# Patient Record
Sex: Female | Born: 1945 | Race: White | Hispanic: No | Smoking: Never smoker
Health system: Southern US, Community
[De-identification: ages and names within clinical notes are randomized; demographics above are authoritative.]

## PROBLEM LIST (undated history)

## (undated) DIAGNOSIS — C4491 Basal cell carcinoma of skin, unspecified: Secondary | ICD-10-CM

## (undated) DIAGNOSIS — M1990 Unspecified osteoarthritis, unspecified site: Secondary | ICD-10-CM

## (undated) DIAGNOSIS — R519 Headache, unspecified: Secondary | ICD-10-CM

## (undated) DIAGNOSIS — C801 Malignant (primary) neoplasm, unspecified: Secondary | ICD-10-CM

## (undated) DIAGNOSIS — Z8701 Personal history of pneumonia (recurrent): Secondary | ICD-10-CM

## (undated) DIAGNOSIS — R51 Headache: Secondary | ICD-10-CM

## (undated) DIAGNOSIS — U071 COVID-19: Secondary | ICD-10-CM

## (undated) DIAGNOSIS — Z8709 Personal history of other diseases of the respiratory system: Secondary | ICD-10-CM

## (undated) DIAGNOSIS — E785 Hyperlipidemia, unspecified: Secondary | ICD-10-CM

## (undated) DIAGNOSIS — N63 Unspecified lump in unspecified breast: Secondary | ICD-10-CM

## (undated) HISTORY — PX: JOINT REPLACEMENT: SHX530

## (undated) HISTORY — PX: BREAST LUMPECTOMY: SHX2

## (undated) HISTORY — DX: Basal cell carcinoma of skin, unspecified: C44.91

## (undated) HISTORY — DX: Hyperlipidemia, unspecified: E78.5

## (undated) HISTORY — PX: COLONOSCOPY: SHX174

## (undated) HISTORY — PX: DILATION AND CURETTAGE OF UTERUS: SHX78

## (undated) HISTORY — PX: MASTECTOMY PARTIAL / LUMPECTOMY: SUR851

## (undated) HISTORY — PX: WISDOM TOOTH EXTRACTION: SHX21

## (undated) HISTORY — DX: COVID-19: U07.1

## (undated) HISTORY — PX: MOHS SURGERY: SUR867

---

## 2014-04-29 HISTORY — PX: BREAST LUMPECTOMY: SHX2

## 2014-07-05 ENCOUNTER — Other Ambulatory Visit: Payer: Self-pay | Admitting: Orthopedic Surgery

## 2014-07-05 DIAGNOSIS — M25562 Pain in left knee: Secondary | ICD-10-CM

## 2014-07-06 ENCOUNTER — Ambulatory Visit
Admission: RE | Admit: 2014-07-06 | Discharge: 2014-07-06 | Disposition: A | Discharge status: Home / Self Care | Payer: Medicare Other | Source: Ambulatory Visit | Attending: Orthopedic Surgery | Admitting: Orthopedic Surgery

## 2014-07-06 DIAGNOSIS — M25562 Pain in left knee: Secondary | ICD-10-CM

## 2015-02-24 ENCOUNTER — Other Ambulatory Visit: Payer: Self-pay | Admitting: General Surgery

## 2015-02-24 DIAGNOSIS — R928 Other abnormal and inconclusive findings on diagnostic imaging of breast: Secondary | ICD-10-CM

## 2015-03-16 ENCOUNTER — Other Ambulatory Visit: Payer: Self-pay | Admitting: General Surgery

## 2015-03-16 DIAGNOSIS — R928 Other abnormal and inconclusive findings on diagnostic imaging of breast: Secondary | ICD-10-CM

## 2015-03-17 ENCOUNTER — Encounter (HOSPITAL_BASED_OUTPATIENT_CLINIC_OR_DEPARTMENT_OTHER): Payer: Self-pay | Admitting: *Deleted

## 2015-03-27 ENCOUNTER — Ambulatory Visit
Admission: RE | Admit: 2015-03-27 | Discharge: 2015-03-27 | Disposition: A | Discharge status: Home / Self Care | Payer: Medicare Other | Source: Ambulatory Visit | Attending: General Surgery | Admitting: General Surgery

## 2015-03-27 ENCOUNTER — Other Ambulatory Visit: Payer: Self-pay | Admitting: General Surgery

## 2015-03-27 DIAGNOSIS — R928 Other abnormal and inconclusive findings on diagnostic imaging of breast: Secondary | ICD-10-CM

## 2015-03-28 ENCOUNTER — Ambulatory Visit (HOSPITAL_BASED_OUTPATIENT_CLINIC_OR_DEPARTMENT_OTHER): Admission: RE | Admit: 2015-03-28 | Payer: Medicare Other | Source: Ambulatory Visit | Admitting: General Surgery

## 2015-03-28 HISTORY — DX: Headache, unspecified: R51.9

## 2015-03-28 HISTORY — DX: Headache: R51

## 2015-03-28 HISTORY — DX: Unspecified lump in unspecified breast: N63.0

## 2015-03-28 SURGERY — RADIOACTIVE SEED GUIDED BREAST BIOPSY
Anesthesia: General | Site: Breast | Laterality: Right

## 2015-03-31 ENCOUNTER — Other Ambulatory Visit: Payer: Self-pay | Admitting: General Surgery

## 2015-03-31 DIAGNOSIS — R928 Other abnormal and inconclusive findings on diagnostic imaging of breast: Secondary | ICD-10-CM

## 2015-04-05 ENCOUNTER — Encounter (HOSPITAL_COMMUNITY)
Admission: RE | Admit: 2015-04-05 | Discharge: 2015-04-05 | Disposition: A | Discharge status: Home / Self Care | Payer: Medicare Other | Source: Ambulatory Visit | Attending: General Surgery | Admitting: General Surgery

## 2015-04-05 ENCOUNTER — Ambulatory Visit
Admission: RE | Admit: 2015-04-05 | Discharge: 2015-04-05 | Disposition: A | Discharge status: Home / Self Care | Payer: Medicare Other | Source: Ambulatory Visit | Attending: General Surgery | Admitting: General Surgery

## 2015-04-05 ENCOUNTER — Encounter (HOSPITAL_COMMUNITY): Payer: Self-pay

## 2015-04-05 ENCOUNTER — Encounter (HOSPITAL_COMMUNITY): Payer: Self-pay | Admitting: General Surgery

## 2015-04-05 ENCOUNTER — Other Ambulatory Visit: Payer: Self-pay | Admitting: General Surgery

## 2015-04-05 DIAGNOSIS — Z85820 Personal history of malignant melanoma of skin: Secondary | ICD-10-CM | POA: Diagnosis not present

## 2015-04-05 DIAGNOSIS — R928 Other abnormal and inconclusive findings on diagnostic imaging of breast: Secondary | ICD-10-CM

## 2015-04-05 DIAGNOSIS — R920 Mammographic microcalcification found on diagnostic imaging of breast: Secondary | ICD-10-CM | POA: Diagnosis present

## 2015-04-05 DIAGNOSIS — Z79899 Other long term (current) drug therapy: Secondary | ICD-10-CM | POA: Diagnosis not present

## 2015-04-05 DIAGNOSIS — M1990 Unspecified osteoarthritis, unspecified site: Secondary | ICD-10-CM | POA: Diagnosis not present

## 2015-04-05 DIAGNOSIS — E7800 Pure hypercholesterolemia, unspecified: Secondary | ICD-10-CM | POA: Diagnosis not present

## 2015-04-05 DIAGNOSIS — Z791 Long term (current) use of non-steroidal anti-inflammatories (NSAID): Secondary | ICD-10-CM | POA: Diagnosis not present

## 2015-04-05 HISTORY — DX: Malignant (primary) neoplasm, unspecified: C80.1

## 2015-04-05 HISTORY — DX: Unspecified osteoarthritis, unspecified site: M19.90

## 2015-04-05 LAB — CBC
HEMATOCRIT: 44.7 % (ref 36.0–46.0)
Hemoglobin: 14.7 g/dL (ref 12.0–15.0)
MCH: 30.5 pg (ref 26.0–34.0)
MCHC: 32.9 g/dL (ref 30.0–36.0)
MCV: 92.7 fL (ref 78.0–100.0)
PLATELETS: 275 10*3/uL (ref 150–400)
RBC: 4.82 MIL/uL (ref 3.87–5.11)
RDW: 12.1 % (ref 11.5–15.5)
WBC: 8.2 10*3/uL (ref 4.0–10.5)

## 2015-04-05 LAB — BASIC METABOLIC PANEL
Anion gap: 8 (ref 5–15)
BUN: 11 mg/dL (ref 6–20)
CO2: 29 mmol/L (ref 22–32)
Calcium: 10.1 mg/dL (ref 8.9–10.3)
Chloride: 105 mmol/L (ref 101–111)
Creatinine, Ser: 0.7 mg/dL (ref 0.44–1.00)
GFR calc Af Amer: >60 mL/min (ref >60)
GFR calc non Af Amer: >60 mL/min (ref >60)
Glucose, Bld: 100 mg/dL — ABNORMAL HIGH (ref 65–99)
Potassium: 4.4 mmol/L (ref 3.5–5.1)
Sodium: 142 mmol/L (ref 135–145)

## 2015-04-05 NOTE — Progress Notes (Signed)
PCP is Dr Lisbeth Ply Denies seeing a cardiologist Denies ever having a card cath or echo States she had a stress test over 15 years ago.

## 2015-04-05 NOTE — Pre-Procedure Instructions (Signed)
Andrea Spencer  04/05/2015     No Pharmacies Listed   Your procedure is scheduled on Dec 8  Report to [place] at 530 A.M.  Call this number if you have problems the morning of surgery:  [phone]   Remember:  Do not eat food or drink liquids after midnight.  Take these medicines the morning of surgery with A SIP OF WATER NA  Stop taking aspirin, Ibuprofen, BC's, Goody's, Herbal medications and Fish Oil, Glucosamine, Biotin    Do not wear jewelry, make-up or nail polish.  Do not wear lotions, powders, or perfumes.  You may wear deodorant.  Do not shave 48 hours prior to surgery.  Men may shave face and neck.  Do not bring valuables to the hospital.  [facility] is not responsible for any belongings or valuables.  Contacts, dentures or bridgework may not be worn into surgery.  Leave your suitcase in the car.  After surgery it may be brought to your room.  For patients admitted to the hospital, discharge time will be determined by your treatment team.  Patients discharged the day of surgery will not be allowed to drive home.    Special instructions:   - Preparing for Surgery  Before surgery, you can play an important role.  Because skin is not sterile, your skin needs to be as free of germs as possible.  You can reduce the number of germs on you skin by washing with CHG (chlorahexidine gluconate) soap before surgery.  CHG is an antiseptic cleaner which kills germs and bonds with the skin to continue killing germs even after washing.  Please DO NOT use if you have an allergy to CHG or antibacterial soaps.  If your skin becomes reddened/irritated stop using the CHG and inform your nurse when you arrive at Short Stay.  Do not shave (including legs and underarms) for at least 48 hours prior to the first CHG shower.  You may shave your face.  Please follow these instructions carefully:   1.  Shower with CHG Soap the night before surgery  and the    morning of Surgery.  2.  If you choose to wash your hair, wash your hair first as usual with your normal shampoo.  3.  After you shampoo, rinse your hair and body thoroughly to remove the                      Shampoo.  4.  Use CHG as you would any other liquid soap.  You can apply chg directly       to the skin and wash gently with scrungie or a clean washcloth.  5.  Apply the CHG Soap to your body ONLY FROM THE NECK DOWN.        Do not use on open wounds or open sores.  Avoid contact with your eyes,       ears, mouth and genitals (private parts).  Wash genitals (private parts)       with your normal soap.  6.  Wash thoroughly, paying special attention to the area where your surgery        will be performed.  7.  Thoroughly rinse your body with warm water from the neck down.  8.  DO NOT shower/wash with your normal soap after using and rinsing off       the CHG Soap.  9.  Pat yourself dry with a clean  towel.            10.  Wear clean pajamas.            11.  Place clean sheets on your bed the night of your first shower and do not        sleep with pets.  Day of Surgery  Do not apply any lotions/deoderants the morning of surgery.  Please wear clean clothes to the hospital/surgery center.     Please read over the following fact sheets that you were given. Pain Booklet, Coughing and Deep Breathing and Surgical Site Infection Prevention

## 2015-04-05 NOTE — H&P (Signed)
[facility] Location: [facility] Patient #: [id number] DOB: [birth year] Married / Language: English / Race: White Female   History of Present Illness Patient words: atypical ductal hyperplasia.  The patient is a 69 year old female who presents with a complaint of Breast problems. Patient is a 69 year old female who is referred by Dr. Lisbeth Ply for a abnormal right mammogram. The patient underwent screening mammography and had calcifications that warranted diagnostic mammography. This demonstrated a group of calcifications in the lower outer quadrant of the right breast. Core needle biopsy was performed which actually demonstrated 2 small areas adjacent to each other. There was fat necrosis and fibrosis with atrophic breast tissue and then there is an area of focal atypical ductal proliferation. It is also another area of calcifications that had been recommended for six-month follow-up. This has not been biopsied.  The patient denies breast pain. She has not had a breast biopsy before. Her father had prostate cancer.    Other Problems Arthritis Hypercholesterolemia Melanoma  Past Surgical History Breast Biopsy Right.  Diagnostic Studies History  Colonoscopy 5-10 years ago Mammogram within last year  Allergies  No Known Drug Allergies10/28/2016  Medication History Atorvastatin Calcium (10MG  Tablet, Oral daily) Active. Meloxicam (15MG  Tablet, Oral daily) Active. (Pt takes 7.5mg  daily) Multivitamin (Oral) Active. Omega 3 (1000MG  Capsule, Oral) Active. Vitamin C (1000MG  Tablet, Oral) Active. Vitamin D (Cholecalciferol) (1000UNIT Tablet, Oral daily) Active. Medications Reconciled  Social History Alcohol use Occasional alcohol use. Caffeine use Coffee. No drug use Tobacco use Never smoker.  Family History Cerebrovascular Accident Mother. Prostate Cancer Father.  Pregnancy / Birth History Age at menarche 41 years. Age of  menopause 61-62 Contraceptive History Oral contraceptives. Gravida 1 Maternal age 72-30 Para 1    Review of Systems  General Not Present- Appetite Loss, Chills, Fatigue, Fever, Night Sweats, Weight Gain and Weight Loss. Skin Not Present- Change in Wart/Mole, Dryness, Hives, Jaundice, New Lesions, Non-Healing Wounds, Rash and Ulcer. HEENT Present- Wears glasses/contact lenses. Not Present- Earache, Hearing Loss, Hoarseness, Nose Bleed, Oral Ulcers, Ringing in the Ears, Seasonal Allergies, Sinus Pain, Sore Throat, Visual Disturbances and Yellow Eyes. Respiratory Not Present- Bloody sputum, Chronic Cough, Difficulty Breathing, Snoring and Wheezing. Breast Not Present- Breast Mass, Breast Pain, Nipple Discharge and Skin Changes. Cardiovascular Not Present- Chest Pain, Difficulty Breathing Lying Down, Leg Cramps, Palpitations, Rapid Heart Rate, Shortness of Breath and Swelling of Extremities. Gastrointestinal Not Present- Abdominal Pain, Bloating, Bloody Stool, Change in Bowel Habits, Chronic diarrhea, Constipation, Difficulty Swallowing, Excessive gas, Gets full quickly at meals, Hemorrhoids, Indigestion, Nausea, Rectal Pain and Vomiting. Female Genitourinary Not Present- Frequency, Nocturia, Painful Urination, Pelvic Pain and Urgency. Musculoskeletal Present- Joint Stiffness. Not Present- Back Pain, Joint Pain, Muscle Pain, Muscle Weakness and Swelling of Extremities. Neurological Not Present- Decreased Memory, Fainting, Headaches, Numbness, Seizures, Tingling, Tremor, Trouble walking and Weakness. Psychiatric Not Present- Anxiety, Bipolar, Change in Sleep Pattern, Depression, Fearful and Frequent crying. Endocrine Present- Hair Changes. Not Present- Cold Intolerance, Excessive Hunger, Heat Intolerance, Hot flashes and New Diabetes. Hematology Not Present- Easy Bruising, Excessive bleeding, Gland problems, HIV and Persistent Infections.  Vitals [name] CMA; 02/24/2015 10:39  AM) 02/24/2015 10:39 AM Weight: 147.4 lb Height: 62in Body Surface Area: 1.68 m Body Mass Index: 26.96 kg/m  Temp.: 98.61F(Oral)  Pulse: 76 (Regular)  BP: 162/80 (Sitting, Left Arm, Standard)       Physical Exam General Mental Status-Alert. General Appearance-Consistent with stated age. Hydration-Well hydrated. Voice-Normal.  Head and Neck Head-normocephalic, atraumatic with no  lesions or palpable masses. Trachea-midline. Thyroid Gland Characteristics - normal size and consistency.  Eye Eyeball - Bilateral-Extraocular movements intact. Sclera/Conjunctiva - Bilateral-No scleral icterus.  Chest and Lung Exam Chest and lung exam reveals -quiet, even and easy respiratory effort with no use of accessory muscles and on auscultation, normal breath sounds, no adventitious sounds and normal vocal resonance. Inspection Chest Wall - Normal. Back - normal.  Breast Note: The left breast is significantly larger than the right breast. There are no palpable masses in either breast. There is no nipple retraction or skin dimpling. She has no axillary lymphadenopathy.   Cardiovascular Cardiovascular examination reveals -normal heart sounds, regular rate and rhythm with no murmurs and normal pedal pulses bilaterally.  Abdomen Inspection Inspection of the abdomen reveals - No Hernias. Palpation/Percussion Palpation and Percussion of the abdomen reveal - Soft, Non Tender, No Rebound tenderness, No Rigidity (guarding) and No hepatosplenomegaly. Auscultation Auscultation of the abdomen reveals - Bowel sounds normal.  Neurologic Neurologic evaluation reveals -alert and oriented x 3 with no impairment of recent or remote memory. Mental Status-Normal.  Musculoskeletal Global Assessment -Note: no gross deformities.  Normal Exam - Left-Upper Extremity Strength Normal and Lower Extremity Strength Normal. Normal Exam - Right-Upper Extremity  Strength Normal and Lower Extremity Strength Normal.  Lymphatic Head & Neck  General Head & Neck Lymphatics: Bilateral - Description - Normal. Axillary  General Axillary Region: Bilateral - Description - Normal. Tenderness - Non Tender. Femoral & Inguinal  Generalized Femoral & Inguinal Lymphatics: Bilateral - Description - No Generalized lymphadenopathy.    Assessment & Plan  ABNORMAL MAMMOGRAM OF RIGHT BREAST (R92.8) Impression: The patient will need an excisional biopsy of the area with the atypical ductal hyperplasia. She may also need a biopsy of the additional area that is being followed. I'll discuss that with radiology.  I reviewed the risks of surgery as well as the procedure. I discussed that we typically have a [place] wire placed following the appropriate area for excision. I discussed the risks of bleeding, infection, and damage to adjacent structures, numbness, change in breast contour, heart or lung complications, and risk of cancer with possible need for requiring additional surgeries or procedures. Patient understands and wishes to proceed. Current Plans Pt Education - [facility] Breast Biopsy HCI: discussed with patient and provided information. You are being scheduled for surgery - Our schedulers will call you.  You should hear from our office's scheduling department within 5 working days about the location, date, and time of surgery. We try to make accommodations for patient's preferences in scheduling surgery, but sometimes the OR schedule or the surgeon's schedule prevents [place] from making those accommodations.  If you have not heard from our office [phone]) in 5 working days, call the office and ask for your surgeon's nurse.  If you have other questions about your diagnosis, plan, or surgery, call the office and ask for your surgeon's nurse.  Pt Education - [facility] Free Text Education/Instructions: discussed with patient and provided information.   Signed by Stark Klein, MD

## 2015-04-06 ENCOUNTER — Encounter (HOSPITAL_COMMUNITY): Payer: Self-pay | Admitting: Surgery

## 2015-04-06 ENCOUNTER — Encounter (HOSPITAL_COMMUNITY)
Admission: RE | Disposition: A | Discharge status: Home / Self Care | Payer: Self-pay | Source: Ambulatory Visit | Attending: General Surgery

## 2015-04-06 ENCOUNTER — Ambulatory Visit (HOSPITAL_COMMUNITY)
Admission: RE | Admit: 2015-04-06 | Discharge: 2015-04-06 | Disposition: A | Discharge status: Home / Self Care | Payer: Medicare Other | Source: Ambulatory Visit | Attending: General Surgery | Admitting: General Surgery

## 2015-04-06 ENCOUNTER — Ambulatory Visit (HOSPITAL_COMMUNITY): Payer: Medicare Other | Admitting: Certified Registered Nurse Anesthetist

## 2015-04-06 ENCOUNTER — Ambulatory Visit
Admission: RE | Admit: 2015-04-06 | Discharge: 2015-04-06 | Disposition: A | Discharge status: Home / Self Care | Payer: Medicare Other | Source: Ambulatory Visit | Attending: General Surgery | Admitting: General Surgery

## 2015-04-06 DIAGNOSIS — R920 Mammographic microcalcification found on diagnostic imaging of breast: Secondary | ICD-10-CM | POA: Diagnosis not present

## 2015-04-06 DIAGNOSIS — Z85820 Personal history of malignant melanoma of skin: Secondary | ICD-10-CM | POA: Insufficient documentation

## 2015-04-06 DIAGNOSIS — M1990 Unspecified osteoarthritis, unspecified site: Secondary | ICD-10-CM | POA: Insufficient documentation

## 2015-04-06 DIAGNOSIS — Z79899 Other long term (current) drug therapy: Secondary | ICD-10-CM | POA: Insufficient documentation

## 2015-04-06 DIAGNOSIS — R928 Other abnormal and inconclusive findings on diagnostic imaging of breast: Secondary | ICD-10-CM

## 2015-04-06 DIAGNOSIS — E7800 Pure hypercholesterolemia, unspecified: Secondary | ICD-10-CM | POA: Insufficient documentation

## 2015-04-06 DIAGNOSIS — Z791 Long term (current) use of non-steroidal anti-inflammatories (NSAID): Secondary | ICD-10-CM | POA: Insufficient documentation

## 2015-04-06 HISTORY — PX: RADIOACTIVE SEED GUIDED EXCISIONAL BREAST BIOPSY: SHX6490

## 2015-04-06 SURGERY — RADIOACTIVE SEED GUIDED BREAST BIOPSY
Anesthesia: General | Site: Breast | Laterality: Right

## 2015-04-06 MED ORDER — ONDANSETRON HCL 4 MG/2ML IJ SOLN
INTRAMUSCULAR | Status: AC
  Filled 2015-04-06: qty 2

## 2015-04-06 MED ORDER — LIDOCAINE HCL 1 % IJ SOLN
INTRAMUSCULAR | Status: DC | PRN
  Administered 2015-04-06: 1 mL via INTRAMUSCULAR

## 2015-04-06 MED ORDER — MIDAZOLAM HCL 5 MG/5ML IJ SOLN
INTRAMUSCULAR | Status: DC | PRN
  Administered 2015-04-06: 2 mg via INTRAVENOUS

## 2015-04-06 MED ORDER — MIDAZOLAM HCL 2 MG/2ML IJ SOLN
INTRAMUSCULAR | Status: AC
  Filled 2015-04-06: qty 2

## 2015-04-06 MED ORDER — LIDOCAINE HCL (CARDIAC) 10 MG/ML IV SOLN
INTRAVENOUS | Status: DC | PRN
  Administered 2015-04-06: 60 mg via INTRAVENOUS

## 2015-04-06 MED ORDER — LIDOCAINE HCL (PF) 1 % IJ SOLN
INTRAMUSCULAR | Status: AC
  Filled 2015-04-06: qty 30

## 2015-04-06 MED ORDER — HYDROMORPHONE HCL 1 MG/ML IJ SOLN: 0.2500 mg | INTRAMUSCULAR | Status: DC | PRN

## 2015-04-06 MED ORDER — PROPOFOL 10 MG/ML IV BOLUS
INTRAVENOUS | Status: AC
  Filled 2015-04-06: qty 20

## 2015-04-06 MED ORDER — ROCURONIUM BROMIDE 50 MG/5ML IV SOLN
INTRAVENOUS | Status: AC
  Filled 2015-04-06: qty 1

## 2015-04-06 MED ORDER — NEOSTIGMINE METHYLSULFATE 10 MG/10ML IV SOLN
INTRAVENOUS | Status: AC
  Filled 2015-04-06: qty 1

## 2015-04-06 MED ORDER — EPHEDRINE SULFATE 50 MG/ML IJ SOLN
INTRAMUSCULAR | Status: AC
  Filled 2015-04-06: qty 1

## 2015-04-06 MED ORDER — SODIUM CHLORIDE 0.9 % IJ SOLN
INTRAMUSCULAR | Status: AC
  Filled 2015-04-06: qty 10

## 2015-04-06 MED ORDER — EPINEPHRINE HCL 0.1 MG/ML IJ SOSY
PREFILLED_SYRINGE | INTRAMUSCULAR | Status: AC
  Filled 2015-04-06: qty 10

## 2015-04-06 MED ORDER — PHENYLEPHRINE 40 MCG/ML (10ML) SYRINGE FOR IV PUSH (FOR BLOOD PRESSURE SUPPORT)
PREFILLED_SYRINGE | INTRAVENOUS | Status: AC
  Filled 2015-04-06: qty 10

## 2015-04-06 MED ORDER — LACTATED RINGERS IV SOLN
INTRAVENOUS | Status: DC | PRN
  Administered 2015-04-06: 08:00:00 via INTRAVENOUS

## 2015-04-06 MED ORDER — GLYCOPYRROLATE 0.2 MG/ML IJ SOLN
INTRAMUSCULAR | Status: AC
  Filled 2015-04-06: qty 1

## 2015-04-06 MED ORDER — LIDOCAINE HCL (CARDIAC) 20 MG/ML IV SOLN
INTRAVENOUS | Status: AC
  Filled 2015-04-06: qty 5

## 2015-04-06 MED ORDER — CEFAZOLIN SODIUM-DEXTROSE 2-3 GM-% IV SOLR
2.0000 g | INTRAVENOUS | Status: AC
  Administered 2015-04-06: 2 g via INTRAVENOUS

## 2015-04-06 MED ORDER — PROPOFOL 10 MG/ML IV BOLUS
INTRAVENOUS | Status: DC | PRN
  Administered 2015-04-06: 170 mg via INTRAVENOUS

## 2015-04-06 MED ORDER — CEFAZOLIN SODIUM-DEXTROSE 2-3 GM-% IV SOLR
INTRAVENOUS | Status: AC
  Filled 2015-04-06: qty 50

## 2015-04-06 MED ORDER — DEXAMETHASONE SODIUM PHOSPHATE 4 MG/ML IJ SOLN
INTRAMUSCULAR | Status: DC | PRN
  Administered 2015-04-06: 10 mg via INTRAVENOUS

## 2015-04-06 MED ORDER — PROMETHAZINE HCL 25 MG/ML IJ SOLN: 6.2500 mg | INTRAMUSCULAR | Status: DC | PRN

## 2015-04-06 MED ORDER — 0.9 % SODIUM CHLORIDE (POUR BTL) OPTIME
TOPICAL | Status: DC | PRN
  Administered 2015-04-06: 1000 mL

## 2015-04-06 MED ORDER — ONDANSETRON HCL 4 MG/2ML IJ SOLN
INTRAMUSCULAR | Status: DC | PRN
Start: 2015-04-06 — End: 2015-04-06
  Administered 2015-04-06: 4 mg via INTRAVENOUS

## 2015-04-06 MED ORDER — EPHEDRINE SULFATE 50 MG/ML IJ SOLN
INTRAMUSCULAR | Status: DC | PRN
  Administered 2015-04-06: 10 mg via INTRAVENOUS

## 2015-04-06 MED ORDER — SUCCINYLCHOLINE CHLORIDE 20 MG/ML IJ SOLN
INTRAMUSCULAR | Status: AC
  Filled 2015-04-06: qty 1

## 2015-04-06 MED ORDER — FENTANYL CITRATE (PF) 100 MCG/2ML IJ SOLN
INTRAMUSCULAR | Status: DC | PRN
  Administered 2015-04-06: 50 ug via INTRAVENOUS
  Administered 2015-04-06: 25 ug via INTRAVENOUS
  Administered 2015-04-06 (×2): 12.5 ug via INTRAVENOUS

## 2015-04-06 MED ORDER — NEOSTIGMINE METHYLSULFATE 10 MG/10ML IV SOLN
INTRAVENOUS | Status: AC
  Filled 2015-04-06: qty 2

## 2015-04-06 MED ORDER — ATROPINE SULFATE 0.1 MG/ML IJ SOLN
INTRAMUSCULAR | Status: AC
  Filled 2015-04-06: qty 10

## 2015-04-06 MED ORDER — OXYCODONE-ACETAMINOPHEN 5-325 MG PO TABS: 1.0000 | ORAL_TABLET | ORAL | Status: DC | PRN

## 2015-04-06 MED ORDER — FENTANYL CITRATE (PF) 250 MCG/5ML IJ SOLN
INTRAMUSCULAR | Status: AC
  Filled 2015-04-06: qty 5

## 2015-04-06 MED ORDER — BUPIVACAINE-EPINEPHRINE (PF) 0.25% -1:200000 IJ SOLN
INTRAMUSCULAR | Status: AC
  Filled 2015-04-06: qty 30

## 2015-04-06 MED ORDER — MEPERIDINE HCL 25 MG/ML IJ SOLN: 6.2500 mg | INTRAMUSCULAR | Status: DC | PRN

## 2015-04-06 SURGICAL SUPPLY — 53 items
ADH SKN CLS APL DERMABOND .7 (GAUZE/BANDAGES/DRESSINGS) ×1
APPLIER CLIP 9.375 MED OPEN (MISCELLANEOUS)
APR CLP MED 9.3 20 MLT OPN (MISCELLANEOUS)
BINDER BREAST LRG (GAUZE/BANDAGES/DRESSINGS) ×3 IMPLANT
BINDER BREAST XLRG (GAUZE/BANDAGES/DRESSINGS) IMPLANT
BLADE SURG 15 STRL LF DISP TIS (BLADE) ×1 IMPLANT
BLADE SURG 15 STRL SS (BLADE) ×3
CANISTER SUCTION 2500CC (MISCELLANEOUS) ×3 IMPLANT
CHLORAPREP W/TINT 26ML (MISCELLANEOUS) ×3 IMPLANT
CLIP APPLIE 9.375 MED OPEN (MISCELLANEOUS) IMPLANT
CLIP TI LARGE 6 (CLIP) ×3 IMPLANT
CLOSURE WOUND 1/2 X4 (GAUZE/BANDAGES/DRESSINGS) ×1
COVER PROBE W GEL 5X96 (DRAPES) ×3 IMPLANT
COVER SURGICAL LIGHT HANDLE (MISCELLANEOUS) ×3 IMPLANT
DERMABOND ADVANCED (GAUZE/BANDAGES/DRESSINGS) ×2
DERMABOND ADVANCED .7 DNX12 (GAUZE/BANDAGES/DRESSINGS) ×1 IMPLANT
DEVICE DUBIN SPECIMEN MAMMOGRA (MISCELLANEOUS) ×3 IMPLANT
DRAPE CHEST BREAST 15X10 FENES (DRAPES) ×3 IMPLANT
DRAPE UTILITY W/TAPE 26X15 (DRAPES) ×3 IMPLANT
DRSG PAD ABDOMINAL 8X10 ST (GAUZE/BANDAGES/DRESSINGS) ×3 IMPLANT
ELECT CAUTERY BLADE 6.4 (BLADE) ×3 IMPLANT
ELECT REM PT RETURN 9FT ADLT (ELECTROSURGICAL) ×3
ELECTRODE REM PT RTRN 9FT ADLT (ELECTROSURGICAL) ×1 IMPLANT
GLOVE BIO SURGEON STRL SZ 6 (GLOVE) ×3 IMPLANT
GLOVE BIOGEL PI IND STRL 6.5 (GLOVE) ×1 IMPLANT
GLOVE BIOGEL PI IND STRL 7.0 (GLOVE) ×1 IMPLANT
GLOVE BIOGEL PI INDICATOR 6.5 (GLOVE) ×2
GLOVE BIOGEL PI INDICATOR 7.0 (GLOVE) ×2
GLOVE SURG SS PI 7.0 STRL IVOR (GLOVE) ×3 IMPLANT
GOWN STRL REUS W/ TWL LRG LVL3 (GOWN DISPOSABLE) ×1 IMPLANT
GOWN STRL REUS W/TWL 2XL LVL3 (GOWN DISPOSABLE) ×3 IMPLANT
GOWN STRL REUS W/TWL LRG LVL3 (GOWN DISPOSABLE) ×3
KIT BASIN OR (CUSTOM PROCEDURE TRAY) ×3 IMPLANT
KIT MARKER MARGIN INK (KITS) ×3 IMPLANT
NEEDLE HYPO 25X1 1.5 SAFETY (NEEDLE) ×3 IMPLANT
NS IRRIG 1000ML POUR BTL (IV SOLUTION) IMPLANT
PACK SURGICAL SETUP 50X90 (CUSTOM PROCEDURE TRAY) ×3 IMPLANT
PENCIL BUTTON HOLSTER BLD 10FT (ELECTRODE) ×3 IMPLANT
SPONGE LAP 18X18 X RAY DECT (DISPOSABLE) ×3 IMPLANT
STRIP CLOSURE SKIN 1/2X4 (GAUZE/BANDAGES/DRESSINGS) ×2 IMPLANT
SUT MNCRL AB 4-0 PS2 18 (SUTURE) ×3 IMPLANT
SUT SILK 2 0 SH (SUTURE) IMPLANT
SUT VIC AB 2-0 SH 27 (SUTURE) ×3
SUT VIC AB 2-0 SH 27XBRD (SUTURE) ×1 IMPLANT
SUT VIC AB 3-0 SH 27 (SUTURE) ×3
SUT VIC AB 3-0 SH 27X BRD (SUTURE) ×1 IMPLANT
SYR BULB 3OZ (MISCELLANEOUS) ×3 IMPLANT
SYR CONTROL 10ML LL (SYRINGE) ×3 IMPLANT
TOWEL OR 17X24 6PK STRL BLUE (TOWEL DISPOSABLE) IMPLANT
TOWEL OR 17X26 10 PK STRL BLUE (TOWEL DISPOSABLE) ×3 IMPLANT
TUBE CONNECTING 12'X1/4 (SUCTIONS) ×1
TUBE CONNECTING 12X1/4 (SUCTIONS) ×2 IMPLANT
YANKAUER SUCT BULB TIP NO VENT (SUCTIONS) ×3 IMPLANT

## 2015-04-06 NOTE — Transfer of Care (Signed)
Immediate Anesthesia Transfer of Care Note  Patient: Andrea Spencer  Procedure(s) Performed: Procedure(s): RADIOACTIVE SEED GUIDED EXCISIONAL RIGHT BREAST BIOPSY (Right)  Patient Location: PACU  Anesthesia Type:General  Level of Consciousness: awake, alert , oriented and patient cooperative  Airway & Oxygen Therapy: Patient Spontanous Breathing and Patient connected to face mask oxygen  Post-op Assessment: Report given to RN, Post -op Vital signs reviewed and stable and Patient moving all extremities  Post vital signs: Reviewed and stable  Last Vitals:  Filed Vitals:   04/06/15 0615  BP: 119/83  Pulse: 72  Temp: 37.4 C  Resp: 20    Complications: No apparent anesthesia complications

## 2015-04-06 NOTE — Progress Notes (Signed)
Called Dr.Germeroth for sign out  

## 2015-04-06 NOTE — Anesthesia Preprocedure Evaluation (Signed)
Anesthesia Evaluation  Patient identified by MRN, date of birth, ID band Patient awake    Reviewed: Allergy & Precautions, NPO status , Patient's Chart, lab work & pertinent test results  Airway Mallampati: II  TM Distance: >3 FB Neck ROM: Full    Dental no notable dental hx.    Pulmonary neg pulmonary ROS,    Pulmonary exam normal breath sounds clear to auscultation       Cardiovascular negative cardio ROS Normal cardiovascular exam Rhythm:Regular Rate:Normal     Neuro/Psych  Headaches, negative psych ROS   GI/Hepatic negative GI ROS, Neg liver ROS,   Endo/Other  negative endocrine ROS  Renal/GU negative Renal ROS     Musculoskeletal  (+) Arthritis ,   Abdominal   Peds  Hematology negative hematology ROS (+)   Anesthesia Other Findings   Reproductive/Obstetrics negative OB ROS                             Anesthesia Physical Anesthesia Plan  ASA: II  Anesthesia Plan: General   Post-op Pain Management:    Induction: Intravenous  Airway Management Planned: LMA  Additional Equipment:   Intra-op Plan:   Post-operative Plan: Extubation in OR  Informed Consent: I have reviewed the patients History and Physical, chart, labs and discussed the procedure including the risks, benefits and alternatives for the proposed anesthesia with the patient or authorized representative who has indicated his/her understanding and acceptance.   Dental advisory given  Plan Discussed with: CRNA  Anesthesia Plan Comments:         Anesthesia Quick Evaluation

## 2015-04-06 NOTE — Discharge Instructions (Signed)
[facility] °Office Phone Number [phone] ° °BREAST BIOPSY/ PARTIAL MASTECTOMY: POST OP INSTRUCTIONS ° °Always review your discharge instruction sheet given to you by the facility where your surgery was performed. ° °IF YOU HAVE DISABILITY OR FAMILY LEAVE FORMS, YOU MUST BRING THEM TO THE OFFICE FOR PROCESSING.  DO NOT GIVE THEM TO YOUR DOCTOR. ° °1. A prescription for pain medication may be given to you upon discharge.  Take your pain medication as prescribed, if needed.  If narcotic pain medicine is not needed, then you may take acetaminophen (Tylenol) or ibuprofen (Advil) as needed. °2. Take your usually prescribed medications unless otherwise directed °3. If you need a refill on your pain medication, please contact your pharmacy.  They will contact our office to request authorization.  Prescriptions will not be filled after 5pm or on week-ends. °4. You should eat very light the first 24 hours after surgery, such as soup, crackers, pudding, etc.  Resume your normal diet the day after surgery. °5. Most patients will experience some swelling and bruising in the breast.  Ice packs and a good support bra will help.  Swelling and bruising can take several days to resolve.  °6. It is common to experience some constipation if taking pain medication after surgery.  Increasing fluid intake and taking a stool softener will usually help or prevent this problem from occurring.  A mild laxative (Milk of Magnesia or Miralax) should be taken according to package directions if there are no bowel movements after 48 hours. °7. Unless discharge instructions indicate otherwise, you may remove your bandages 48 hours after surgery, and you may shower at that time.  You may have steri-strips (small skin tapes) in place directly over the incision.  These strips should be left on the skin for 7-10 days.   Any sutures or staples will be removed at the office during your follow-up visit. °8. ACTIVITIES:  You may resume  regular daily activities (gradually increasing) beginning the next day.  Wearing a good support bra or sports bra (or the breast binder) minimizes pain and swelling.  You may have sexual intercourse when it is comfortable. °a. You may drive when you no longer are taking prescription pain medication, you can comfortably wear a seatbelt, and you can safely maneuver your car and apply brakes. °b. RETURN TO WORK:  __________1 week_______________ °9. You should see your doctor in the office for a follow-up appointment approximately two weeks after your surgery.  Your doctor’s nurse will typically make your follow-up appointment when she calls you with your pathology report.  Expect your pathology report 2-3 business days after your surgery.  You may call to check if you do not hear from us after three days. ° ° °WHEN TO CALL YOUR DOCTOR: °1. Fever over 101.0 °2. Nausea and/or vomiting. °3. Extreme swelling or bruising. °4. Continued bleeding from incision. °5. Increased pain, redness, or drainage from the incision. ° °The clinic staff is available to answer your questions during regular business hours.  Please don’t hesitate to call and ask to speak to one of the nurses for clinical concerns.  If you have a medical emergency, go to the nearest emergency room or call 911.  A surgeon from [facility] is always on call at the hospital. ° °For further questions, please visit [email or web address]  ° °

## 2015-04-06 NOTE — Anesthesia Postprocedure Evaluation (Signed)
Anesthesia Post Note  Patient: Andrea Spencer  Procedure(s) Performed: Procedure(s) (LRB): RADIOACTIVE SEED GUIDED EXCISIONAL RIGHT BREAST BIOPSY (Right)  Patient location during evaluation: PACU Anesthesia Type: General Level of consciousness: sedated and patient cooperative Pain management: pain level controlled Vital Signs Assessment: post-procedure vital signs reviewed and stable Respiratory status: spontaneous breathing Cardiovascular status: stable Anesthetic complications: no    Last Vitals:  Filed Vitals:   04/06/15 0850 04/06/15 0908  BP: 121/62 96/60  Pulse: 85 91  Temp:    Resp: 15 23    Last Pain: There were no vitals filed for this visit.               Nolon Nations

## 2015-04-06 NOTE — Interval H&P Note (Signed)
History and Physical Interval Note:  04/06/2015 7:28 AM  Andrea Spencer  has presented today for surgery, with the diagnosis of RIGHT ABNORMAL MAMMOGRAM  The various methods of treatment have been discussed with the patient and family. After consideration of risks, benefits and other options for treatment, the patient has consented to  Procedure(s): RADIOACTIVE SEED GUIDED EXCISIONAL RIGHT BREAST BIOPSY (Right) as a surgical intervention .  The patient's history has been reviewed, patient examined, no change in status, stable for surgery.  I have reviewed the patient's chart and labs.  Questions were answered to the patient's satisfaction.     [name]

## 2015-04-06 NOTE — Anesthesia Procedure Notes (Signed)
Procedure Name: LMA Insertion Date/Time: 04/06/2015 7:45 AM Performed by: Rogers Blocker Pre-anesthesia Checklist: Patient identified, Emergency Drugs available, Suction available, Patient being monitored and Timeout performed Patient Re-evaluated:Patient Re-evaluated prior to inductionOxygen Delivery Method: Circle system utilized Preoxygenation: Pre-oxygenation with 100% oxygen Intubation Type: IV induction Ventilation: Mask ventilation without difficulty LMA: LMA inserted LMA Size: 4.0 Number of attempts: 1 Placement Confirmation: positive ETCO2,  CO2 detector and breath sounds checked- equal and bilateral Tube secured with: Tape Dental Injury: Teeth and Oropharynx as per pre-operative assessment

## 2015-04-06 NOTE — Op Note (Signed)
Right Breast Radioactive seed excisional biopsy  Indications: This patient presents with history of microcaclifications on screening mammogram. Core needle biopsy was positive for ADH  Pre-operative Diagnosis: abnormal right mammogram  Post-operative Diagnosis: Same  Surgeon: Stark Klein   Anesthesia: General endotracheal anesthesia  ASA Class: 2  Procedure Details  The patient was seen in the Holding Room. The risks, benefits, complications, treatment options, and expected outcomes were discussed with the patient. The possibilities of bleeding, infection, the need for additional procedures, failure to diagnose a condition, and creating a complication requiring transfusion or operation were discussed with the patient. The patient concurred with the proposed plan, giving informed consent.  The site of surgery properly noted/marked. The patient was taken to Operating Room # 1, identified, and the procedure verified as Right Breast Seed localized lumpectomy. A Time Out was held and the above information confirmed.  The right breast and chest were prepped and draped in standard fashion. The lumpectomy was performed by creating an radial incision over the lower outer quadrant of the breast over the previously placed radioactive seed.  Dissection was carried down to around the point of maximum signal intensity. The cautery was used to perform the dissection.  Hemostasis was achieved with cautery. The edges of the cavity were marked with large clips, with one each medial, lateral, inferior and superior, and two clips posteriorly.   The specimen was inked with the margin marker paint kit.    Specimen radiography confirmed inclusion of the mammographic lesion, the clip, and the seed.  The background signal in the breast was zero.  The wound was irrigated and closed with 3-0 vicryl in layers and 4-0 monocryl subcuticular suture.      Sterile dressings were applied. At the end of the operation, all sponge,  instrument, and needle counts were correct.  Findings: grossly clear surgical margins and no adenopathy  Estimated Blood Loss:  min         Specimens: Rigth breast excisional biopsy         Complications:  None; patient tolerated the procedure well.         Disposition: PACU - hemodynamically stable.         Condition: stable

## 2015-04-07 ENCOUNTER — Encounter (HOSPITAL_COMMUNITY): Payer: Self-pay | Admitting: General Surgery

## 2015-04-10 NOTE — Progress Notes (Signed)
Quick Note:  Please let patient know pathology is benign. ______ 

## 2015-07-20 DIAGNOSIS — Z79899 Other long term (current) drug therapy: Secondary | ICD-10-CM | POA: Diagnosis not present

## 2015-07-20 DIAGNOSIS — E782 Mixed hyperlipidemia: Secondary | ICD-10-CM | POA: Diagnosis not present

## 2015-07-20 DIAGNOSIS — Z6827 Body mass index (BMI) 27.0-27.9, adult: Secondary | ICD-10-CM | POA: Diagnosis not present

## 2015-09-05 DIAGNOSIS — L509 Urticaria, unspecified: Secondary | ICD-10-CM | POA: Diagnosis not present

## 2015-09-05 DIAGNOSIS — Z6827 Body mass index (BMI) 27.0-27.9, adult: Secondary | ICD-10-CM | POA: Diagnosis not present

## 2015-11-22 DIAGNOSIS — L720 Epidermal cyst: Secondary | ICD-10-CM | POA: Diagnosis not present

## 2015-11-22 DIAGNOSIS — L814 Other melanin hyperpigmentation: Secondary | ICD-10-CM | POA: Diagnosis not present

## 2015-11-22 DIAGNOSIS — L820 Inflamed seborrheic keratosis: Secondary | ICD-10-CM | POA: Diagnosis not present

## 2015-11-22 DIAGNOSIS — D2362 Other benign neoplasm of skin of left upper limb, including shoulder: Secondary | ICD-10-CM | POA: Diagnosis not present

## 2015-11-22 DIAGNOSIS — L821 Other seborrheic keratosis: Secondary | ICD-10-CM | POA: Diagnosis not present

## 2015-12-28 DIAGNOSIS — M1612 Unilateral primary osteoarthritis, left hip: Secondary | ICD-10-CM | POA: Diagnosis not present

## 2016-01-05 DIAGNOSIS — M1612 Unilateral primary osteoarthritis, left hip: Secondary | ICD-10-CM | POA: Diagnosis not present

## 2016-01-08 DIAGNOSIS — Z01818 Encounter for other preprocedural examination: Secondary | ICD-10-CM | POA: Diagnosis not present

## 2016-01-08 DIAGNOSIS — M1612 Unilateral primary osteoarthritis, left hip: Secondary | ICD-10-CM | POA: Diagnosis not present

## 2016-01-08 DIAGNOSIS — E663 Overweight: Secondary | ICD-10-CM | POA: Diagnosis not present

## 2016-01-08 DIAGNOSIS — Z6826 Body mass index (BMI) 26.0-26.9, adult: Secondary | ICD-10-CM | POA: Diagnosis not present

## 2016-01-15 DIAGNOSIS — M1612 Unilateral primary osteoarthritis, left hip: Secondary | ICD-10-CM | POA: Diagnosis present

## 2016-01-15 NOTE — H&P (Signed)
PREOPERATIVE H&P Patient ID: LATRINIA BASHOR MRN: [id number] DOB/AGE: 1946-03-17 70 y.o.  Chief Complaint: OA LEFT HIP  Planned Procedure Date: 01/30/16 Medical and Cardiac Clearance by Daiva Eves     HPI: Andrea Spencer is a 70 y.o. female who presents for evaluation of OA LEFT HIP. The patient has a history of pain and functional disability in the left hip due to arthritis and has failed non-surgical conservative treatments for greater than 12 weeks to include NSAID's and/or analgesics and activity modification.  Onset of symptoms was gradual, starting 2 years ago with gradually worsening course since that time.  Patient currently rates pain at 9 out of 10 with activity. Patient has severe start-up pain, night pain, worsening of pain with activity and weight bearing and pain that interferes with activities of daily living.  Patient has evidence of osteoarthritis of her left hip, end stage, with minimal head collapse by imaging studies. There is no active infection.  Past Medical History:  Diagnosis Date  . Arthritis   . Breast mass    right  . Cancer ([place])    skin cancer  . Headache    Past Surgical History:  Procedure Laterality Date  . COLONOSCOPY    . DILATION AND CURETTAGE OF UTERUS    . RADIOACTIVE SEED GUIDED EXCISIONAL BREAST BIOPSY Right 04/06/2015   Procedure: RADIOACTIVE SEED GUIDED EXCISIONAL RIGHT BREAST BIOPSY;  Surgeon: Stark Klein, MD;  Location: [facility];  Service: General;  Laterality: Right;  . WISDOM TOOTH EXTRACTION     Allergies  Allergen Reactions  . Other Itching    Lotions and Tanning products   Prior to Admission medications   Medication Sig Start Date End Date Taking? Authorizing Provider  atorvastatin (LIPITOR) 10 MG tablet Take 10 mg by mouth daily.    Historical Provider, MD  Biotin 10 MG CAPS Take by mouth.    Historical Provider, MD  calcium carbonate (OS-CAL) 1250 (500 CA) MG chewable tablet Chew 1 tablet by mouth daily.    Historical  Provider, MD  cholecalciferol (VITAMIN D) 1000 UNITS tablet Take 1,000 Units by mouth daily.    Historical Provider, MD  glucosamine-chondroitin 500-400 MG tablet Take 2 tablets by mouth daily.    Historical Provider, MD  Javier Docker Oil 1000 MG CAPS Take by mouth.    Historical Provider, MD  meloxicam (MOBIC) 15 MG tablet Take 15 mg by mouth daily.    Historical Provider, MD  Multiple Vitamin (MULTIVITAMIN WITH MINERALS) TABS tablet Take 1 tablet by mouth daily.    Historical Provider, MD  oxyCODONE-acetaminophen (ROXICET) 5-325 MG tablet Take 1-2 tablets by mouth every 4 (four) hours as needed for severe pain. 04/06/15   Stark Klein, MD  vitamin C (ASCORBIC ACID) 250 MG tablet Take 250 mg by mouth daily.    Historical Provider, MD   Social History   Social History  . Marital status: Married    Spouse name: N/A  . Number of children: N/A  . Years of education: N/A   Social History Main Topics  . Smoking status: Never Smoker  . Smokeless tobacco: Not on file  . Alcohol use Yes     Comment: social  . Drug use: No  . Sexual activity: Not on file   Other Topics Concern  . Not on file   Social History Narrative  . No narrative on file   No family history on file.  ROS: Currently denies lightheadedness, dizziness, Fever, chills, CP, SOB.   No  personal history of DVT, PE, MI, or CVA. No loose teeth or dentures. All other systems have been reviewed and were otherwise currently negative with the exception of those mentioned in the HPI and as above.  Objective: Vitals: Ht: 5'1" Wt: 142 Temp: 98 BP: 154/73 Pulse: 88 O2 97% on room air. Physical Exam: General: Alert, NAD.  Trendelenberg Gait  HEENT: EOMI, Good Neck Extension  Pulm: No increased work of breathing.  Clear B/L A/P w/o crackle or wheeze.  CV: RRR, No m/g/r appreciated  GI: soft, NT, ND Neuro: Neuro grossly intact b/l upper/lower ext.  Sensation intact distally Skin: No lesions in the area of chief complaint MSK/Surgical  Site: Left Hip Non tender over greater trochanter.  Pain with passive ROM.  Positive Stinchfield.  5/5 strength.  NVI.  Sensation intact distally.   Imaging Review Plain radiographs demonstrate osteoarthritis of her left hip, end stage, with minimal head collapse by imaging studies.  Assessment: OA LEFT HIP Principal Problem:   Primary osteoarthritis of left hip  Plan: Plan for Procedure(s): TOTAL HIP ARTHROPLASTY ANTERIOR APPROACH  The patient history, physical exam, clinical judgement of the provider and imaging are consistent with end stage degenerative joint disease and total joint arthroplasty is deemed medically necessary. The treatment options including medical management, injection therapy, and arthroplasty were discussed at length. The risks and benefits of Procedure(s): TOTAL HIP ARTHROPLASTY ANTERIOR APPROACH were presented and reviewed.  The risks of nonoperative treatment, versus surgical intervention including but not limited to continued pain, aseptic loosening, stiffness, dislocation/subluxation, infection, bleeding, nerve injury, blood clots, cardiopulmonary complications, morbidity, mortality, among others were discussed. The patient verbalizes understanding and wishes to proceed with the plan.  Patient is being admitted for inpatient treatment for surgery, pain control, PT, OT, prophylactic antibiotics, VTE prophylaxis, progressive ambulation, ADL's and discharge planning.   Dental prophylaxis discussed and recommended for 2 years postoperatively.  The patient does meet the criteria for TXA which will be used perioperatively via IV.   ASA 325 mg will be used postoperatively for DVT prophylaxis in addition to SCDs, and early ambulation. The patient is planning to be discharged home with home health services in care of her husband.  Charna Elizabeth Martensen III,PA-C 01/15/2016 7:54 AM

## 2016-01-18 ENCOUNTER — Encounter (HOSPITAL_COMMUNITY)
Admission: RE | Admit: 2016-01-18 | Discharge: 2016-01-18 | Disposition: A | Discharge status: Home / Self Care | Payer: PPO | Source: Ambulatory Visit | Attending: Orthopedic Surgery | Admitting: Orthopedic Surgery

## 2016-01-18 ENCOUNTER — Encounter (HOSPITAL_COMMUNITY): Payer: Self-pay

## 2016-01-18 DIAGNOSIS — Z01812 Encounter for preprocedural laboratory examination: Secondary | ICD-10-CM | POA: Insufficient documentation

## 2016-01-18 HISTORY — DX: Personal history of other diseases of the respiratory system: Z87.09

## 2016-01-18 HISTORY — DX: Personal history of pneumonia (recurrent): Z87.01

## 2016-01-18 LAB — BASIC METABOLIC PANEL
Anion gap: 9 (ref 5–15)
BUN: 12 mg/dL (ref 6–20)
CALCIUM: 10 mg/dL (ref 8.9–10.3)
CO2: 28 mmol/L (ref 22–32)
CREATININE: 0.66 mg/dL (ref 0.44–1.00)
Chloride: 103 mmol/L (ref 101–111)
GFR calc non Af Amer: >60 mL/min (ref >60)
Glucose, Bld: 101 mg/dL — ABNORMAL HIGH (ref 65–99)
Potassium: 4 mmol/L (ref 3.5–5.1)
SODIUM: 140 mmol/L (ref 135–145)

## 2016-01-18 LAB — CBC
HCT: 45 % (ref 36.0–46.0)
Hemoglobin: 14.3 g/dL (ref 12.0–15.0)
MCH: 30.2 pg (ref 26.0–34.0)
MCHC: 31.8 g/dL (ref 30.0–36.0)
MCV: 94.9 fL (ref 78.0–100.0)
PLATELETS: 302 10*3/uL (ref 150–400)
RBC: 4.74 MIL/uL (ref 3.87–5.11)
RDW: 12 % (ref 11.5–15.5)
WBC: 8 10*3/uL (ref 4.0–10.5)

## 2016-01-18 LAB — APTT: APTT: 26 s (ref 24–36)

## 2016-01-18 LAB — URINALYSIS, ROUTINE W REFLEX MICROSCOPIC
BILIRUBIN URINE: NEGATIVE
Glucose, UA: NEGATIVE mg/dL
Hgb urine dipstick: NEGATIVE
Ketones, ur: 15 mg/dL — AB
Leukocytes, UA: NEGATIVE
Nitrite: NEGATIVE
PH: 5.5 (ref 5.0–8.0)
Protein, ur: NEGATIVE mg/dL
SPECIFIC GRAVITY, URINE: 1.009 (ref 1.005–1.030)

## 2016-01-18 LAB — PROTIME-INR
INR: 1
PROTHROMBIN TIME: 13.2 s (ref 11.4–15.2)

## 2016-01-18 LAB — TYPE AND SCREEN
ABO/RH(D): O NEG
ANTIBODY SCREEN: NEGATIVE

## 2016-01-18 LAB — SURGICAL PCR SCREEN
MRSA, PCR: NEGATIVE
Staphylococcus aureus: POSITIVE — AB

## 2016-01-18 LAB — ABO/RH: ABO/RH(D): O NEG

## 2016-01-18 NOTE — Pre-Procedure Instructions (Signed)
Andrea Spencer  01/18/2016      Andrea [facility] - [place], [facility] [name] [address] Phone: [phone] Fax: [phone]    Your procedure is scheduled on Tuesday, October 3rd, 2017.  Report to [facility] Admitting at 5:30 A.M.   Call this number if you have problems the morning of surgery:  [phone]   Remember:  Do not eat food or drink liquids after midnight.   Take these medicines the morning of surgery with A SIP OF WATER: Tylenol if needed, Hydrocodone-acetaminophen (Norco) if needed.  7 days prior to surgery, stop taking: Diclofenac (Voltaren), Aspirin, NSAIDS, Aleve, Naproxen, Ibuprofen, Advil, Motrin, BC's, Goody's, Fish oil, all herbal medications, and all vitamins.    Do not wear jewelry, make-up or nail polish.  Do not wear lotions, powders, or perfumes, or deoderant.  Do not shave 48 hours prior to surgery.    Do not bring valuables to the [facility].  [facility] is not responsible for any belongings or valuables.  Contacts, dentures or bridgework may not be worn into surgery.  Leave your suitcase in the car.  After surgery it may be brought to your room.  For patients admitted to the [facility], discharge time will be determined by your treatment team.  Patients discharged the day of surgery will not be allowed to drive home.   Special instructions:  Preparing for Surgery.   Please read over the following fact sheets that you were given. MRSA Information    [facility]- Preparing For Surgery  Before surgery, you can play an important role. Because skin is not sterile, your skin needs to be as free of germs as possible. You can reduce the number of germs on your skin by washing with CHG (chlorahexidine gluconate) Soap before surgery.  CHG is an antiseptic cleaner which kills germs and bonds with the skin to continue killing germs even after washing.  Please do not use if you have an  allergy to CHG or antibacterial soaps. If your skin becomes reddened/irritated stop using the CHG.  Do not shave (including legs and underarms) for at least 48 hours prior to first CHG shower. It is OK to shave your face.  Please follow these instructions carefully.   1. Shower the NIGHT BEFORE SURGERY and the MORNING OF SURGERY with CHG.   2. If you chose to wash your hair, wash your hair first as usual with your normal shampoo.  3. After you shampoo, rinse your hair and body thoroughly to remove the shampoo.  4. Use CHG as you would any other liquid soap. You can apply CHG directly to the skin and wash gently with a scrungie or a clean washcloth.   5. Apply the CHG Soap to your body ONLY FROM THE NECK DOWN.  Do not use on open wounds or open sores. Avoid contact with your eyes, ears, mouth and genitals (private parts). Wash genitals (private parts) with your normal soap.  6. Wash thoroughly, paying special attention to the area where your surgery will be performed.  7. Thoroughly rinse your body with warm water from the neck down.  8. DO NOT shower/wash with your normal soap after using and rinsing off the CHG Soap.  9. Pat yourself dry with a CLEAN TOWEL.   10. Wear CLEAN PAJAMAS   11. Place CLEAN SHEETS on your bed the night of your first shower and DO NOT SLEEP WITH PETS.  Day of Surgery: Do not apply any deodorants/lotions. Please wear clean clothes to the [facility]/surgery [facility].

## 2016-01-18 NOTE — Progress Notes (Signed)
Patient notified of positive PCR and verbalized understanding.  Prescription called to [facility].

## 2016-01-18 NOTE — Progress Notes (Signed)
PCP - Dr. Daiva Eves Cardiologist - denies  EKG/CXR - denies Echo/stress test/cardiac cath - denies  Patient denies chest pain and shortness of breath at PAT appointment.

## 2016-01-19 LAB — URINE CULTURE: Culture: NO GROWTH

## 2016-01-29 MED ORDER — TRANEXAMIC ACID 1000 MG/10ML IV SOLN
1000.0000 mg | INTRAVENOUS | Status: AC
  Administered 2016-01-30: 1000 mg via INTRAVENOUS
  Filled 2016-01-29: qty 10

## 2016-01-30 ENCOUNTER — Inpatient Hospital Stay (HOSPITAL_COMMUNITY): Payer: PPO | Admitting: Anesthesiology

## 2016-01-30 ENCOUNTER — Encounter (HOSPITAL_COMMUNITY)
Admission: RE | Disposition: A | Discharge status: Home Health | Payer: Self-pay | Source: Ambulatory Visit | Attending: Orthopedic Surgery

## 2016-01-30 ENCOUNTER — Inpatient Hospital Stay (HOSPITAL_COMMUNITY): Payer: PPO

## 2016-01-30 ENCOUNTER — Inpatient Hospital Stay (HOSPITAL_COMMUNITY)
Admission: RE | Admit: 2016-01-30 | Discharge: 2016-01-31 | DRG: 470 | Disposition: A | Discharge status: Home Health | Payer: PPO | Source: Ambulatory Visit | Attending: Orthopedic Surgery | Admitting: Orthopedic Surgery

## 2016-01-30 ENCOUNTER — Encounter (HOSPITAL_COMMUNITY): Payer: Self-pay | Admitting: *Deleted

## 2016-01-30 DIAGNOSIS — Z791 Long term (current) use of non-steroidal anti-inflammatories (NSAID): Secondary | ICD-10-CM | POA: Diagnosis not present

## 2016-01-30 DIAGNOSIS — Z79899 Other long term (current) drug therapy: Secondary | ICD-10-CM

## 2016-01-30 DIAGNOSIS — Z8701 Personal history of pneumonia (recurrent): Secondary | ICD-10-CM

## 2016-01-30 DIAGNOSIS — M169 Osteoarthritis of hip, unspecified: Secondary | ICD-10-CM | POA: Diagnosis not present

## 2016-01-30 DIAGNOSIS — Z471 Aftercare following joint replacement surgery: Secondary | ICD-10-CM | POA: Diagnosis not present

## 2016-01-30 DIAGNOSIS — Z419 Encounter for procedure for purposes other than remedying health state, unspecified: Secondary | ICD-10-CM

## 2016-01-30 DIAGNOSIS — Z85828 Personal history of other malignant neoplasm of skin: Secondary | ICD-10-CM | POA: Diagnosis not present

## 2016-01-30 DIAGNOSIS — Z96642 Presence of left artificial hip joint: Secondary | ICD-10-CM | POA: Diagnosis not present

## 2016-01-30 DIAGNOSIS — M1612 Unilateral primary osteoarthritis, left hip: Principal | ICD-10-CM | POA: Diagnosis present

## 2016-01-30 DIAGNOSIS — M25552 Pain in left hip: Secondary | ICD-10-CM | POA: Diagnosis not present

## 2016-01-30 HISTORY — PX: TOTAL HIP ARTHROPLASTY: SHX124

## 2016-01-30 SURGERY — ARTHROPLASTY, HIP, TOTAL, ANTERIOR APPROACH
Anesthesia: Spinal | Site: Hip | Laterality: Left

## 2016-01-30 MED ORDER — BUPIVACAINE LIPOSOME 1.3 % IJ SUSP
20.0000 mL | INTRAMUSCULAR | Status: DC
  Filled 2016-01-30: qty 20

## 2016-01-30 MED ORDER — OMEPRAZOLE 20 MG PO CPDR: 20.0000 mg | DELAYED_RELEASE_CAPSULE | Freq: Every day | ORAL | 2 refills | Status: DC

## 2016-01-30 MED ORDER — ACETAMINOPHEN 500 MG PO TABS
ORAL_TABLET | ORAL | Status: AC
  Filled 2016-01-30: qty 2

## 2016-01-30 MED ORDER — ACETAMINOPHEN 500 MG PO TABS: 1000.0000 mg | ORAL_TABLET | Freq: Once | ORAL | Status: DC

## 2016-01-30 MED ORDER — PROPOFOL 500 MG/50ML IV EMUL
INTRAVENOUS | Status: DC | PRN
  Administered 2016-01-30: 75 ug/kg/min via INTRAVENOUS

## 2016-01-30 MED ORDER — FENTANYL CITRATE (PF) 100 MCG/2ML IJ SOLN
INTRAMUSCULAR | Status: AC
  Filled 2016-01-30: qty 4

## 2016-01-30 MED ORDER — ONDANSETRON HCL 4 MG/2ML IJ SOLN
INTRAMUSCULAR | Status: DC | PRN
  Administered 2016-01-30: 4 mg via INTRAVENOUS

## 2016-01-30 MED ORDER — ONDANSETRON HCL 4 MG PO TABS: 4.0000 mg | ORAL_TABLET | Freq: Three times a day (TID) | ORAL | 0 refills | Status: DC | PRN

## 2016-01-30 MED ORDER — DOCUSATE SODIUM 100 MG PO CAPS: 100.0000 mg | ORAL_CAPSULE | Freq: Two times a day (BID) | ORAL | 0 refills | Status: DC

## 2016-01-30 MED ORDER — OXYCODONE HCL 5 MG PO TABS
ORAL_TABLET | ORAL | Status: AC
  Filled 2016-01-30: qty 1

## 2016-01-30 MED ORDER — SODIUM CHLORIDE 0.9 % IV SOLN
INTRAVENOUS | Status: DC | PRN
  Administered 2016-01-30: 40 mL

## 2016-01-30 MED ORDER — 0.9 % SODIUM CHLORIDE (POUR BTL) OPTIME
TOPICAL | Status: DC | PRN
  Administered 2016-01-30: 1000 mL

## 2016-01-30 MED ORDER — ONDANSETRON HCL 4 MG/2ML IJ SOLN: 4.0000 mg | Freq: Once | INTRAMUSCULAR | Status: DC | PRN

## 2016-01-30 MED ORDER — PROPOFOL 10 MG/ML IV BOLUS
INTRAVENOUS | Status: DC | PRN
  Administered 2016-01-30: 200 mg via INTRAVENOUS
  Administered 2016-01-30: 20 mg via INTRAVENOUS

## 2016-01-30 MED ORDER — PHENYLEPHRINE HCL 10 MG/ML IJ SOLN
INTRAMUSCULAR | Status: DC | PRN
  Administered 2016-01-30 (×3): 80 ug via INTRAVENOUS

## 2016-01-30 MED ORDER — SODIUM CHLORIDE FLUSH 0.9 % IV SOLN: INTRAVENOUS | Status: DC | PRN

## 2016-01-30 MED ORDER — METOCLOPRAMIDE HCL 5 MG/ML IJ SOLN: 5.0000 mg | Freq: Three times a day (TID) | INTRAMUSCULAR | Status: DC | PRN

## 2016-01-30 MED ORDER — METHOCARBAMOL 1000 MG/10ML IJ SOLN
500.0000 mg | Freq: Four times a day (QID) | INTRAVENOUS | Status: DC | PRN
  Filled 2016-01-30: qty 5

## 2016-01-30 MED ORDER — ONDANSETRON HCL 4 MG/2ML IJ SOLN
INTRAMUSCULAR | Status: AC
  Filled 2016-01-30: qty 2

## 2016-01-30 MED ORDER — SENNA 8.6 MG PO TABS
1.0000 | ORAL_TABLET | Freq: Two times a day (BID) | ORAL | Status: DC
  Administered 2016-01-30 – 2016-01-31 (×2): 8.6 mg via ORAL
  Filled 2016-01-30 (×2): qty 1

## 2016-01-30 MED ORDER — PROPOFOL 10 MG/ML IV BOLUS
INTRAVENOUS | Status: AC
  Filled 2016-01-30: qty 20

## 2016-01-30 MED ORDER — LIDOCAINE HCL (CARDIAC) 20 MG/ML IV SOLN
INTRAVENOUS | Status: DC | PRN
  Administered 2016-01-30: 60 mg via INTRAVENOUS

## 2016-01-30 MED ORDER — OXYCODONE-ACETAMINOPHEN 5-325 MG PO TABS: 1.0000 | ORAL_TABLET | ORAL | 0 refills | Status: DC | PRN

## 2016-01-30 MED ORDER — PHENOL 1.4 % MT LIQD
1.0000 | OROMUCOSAL | Status: DC | PRN
Start: 2016-01-30 — End: 2016-01-31

## 2016-01-30 MED ORDER — DEXAMETHASONE SODIUM PHOSPHATE 10 MG/ML IJ SOLN
INTRAMUSCULAR | Status: AC
  Filled 2016-01-30: qty 1

## 2016-01-30 MED ORDER — PHENYLEPHRINE HCL 10 MG/ML IJ SOLN
INTRAMUSCULAR | Status: DC | PRN
  Administered 2016-01-30: 20 ug/min via INTRAVENOUS

## 2016-01-30 MED ORDER — ACETAMINOPHEN 325 MG PO TABS: 650.0000 mg | ORAL_TABLET | Freq: Four times a day (QID) | ORAL | Status: DC | PRN

## 2016-01-30 MED ORDER — MORPHINE SULFATE (PF) 2 MG/ML IV SOLN
2.0000 mg | INTRAVENOUS | Status: DC | PRN
Start: 2016-01-30 — End: 2016-01-31

## 2016-01-30 MED ORDER — DEXTROSE-NACL 5-0.45 % IV SOLN
INTRAVENOUS | Status: DC
  Administered 2016-01-30: 18:00:00 via INTRAVENOUS

## 2016-01-30 MED ORDER — DEXAMETHASONE SODIUM PHOSPHATE 10 MG/ML IJ SOLN
10.0000 mg | Freq: Once | INTRAMUSCULAR | Status: AC
  Administered 2016-01-31: 10 mg via INTRAVENOUS
  Filled 2016-01-30: qty 1

## 2016-01-30 MED ORDER — DEXAMETHASONE SODIUM PHOSPHATE 4 MG/ML IJ SOLN
INTRAMUSCULAR | Status: DC | PRN
  Administered 2016-01-30: 10 mg via INTRAVENOUS

## 2016-01-30 MED ORDER — FENTANYL CITRATE (PF) 100 MCG/2ML IJ SOLN
INTRAMUSCULAR | Status: DC | PRN
  Administered 2016-01-30: 25 ug via INTRAVENOUS
  Administered 2016-01-30: 50 ug via INTRAVENOUS

## 2016-01-30 MED ORDER — METHOCARBAMOL 500 MG PO TABS: 500.0000 mg | ORAL_TABLET | Freq: Four times a day (QID) | ORAL | 0 refills | Status: DC | PRN

## 2016-01-30 MED ORDER — DIPHENHYDRAMINE HCL 12.5 MG/5ML PO ELIX: 12.5000 mg | ORAL_SOLUTION | ORAL | Status: DC | PRN

## 2016-01-30 MED ORDER — CEFAZOLIN SODIUM-DEXTROSE 2-4 GM/100ML-% IV SOLN
2.0000 g | Freq: Four times a day (QID) | INTRAVENOUS | Status: AC
  Administered 2016-01-31: 2 g via INTRAVENOUS
  Filled 2016-01-30 (×3): qty 100

## 2016-01-30 MED ORDER — PROPOFOL 1000 MG/100ML IV EMUL
INTRAVENOUS | Status: AC
Start: 2016-01-30 — End: 2016-01-30
  Filled 2016-01-30: qty 300

## 2016-01-30 MED ORDER — LACTATED RINGERS IV SOLN
INTRAVENOUS | Status: DC
  Administered 2016-01-30 (×2): via INTRAVENOUS

## 2016-01-30 MED ORDER — FLEET ENEMA 7-19 GM/118ML RE ENEM: 1.0000 | ENEMA | Freq: Once | RECTAL | Status: DC | PRN

## 2016-01-30 MED ORDER — MIDAZOLAM HCL 2 MG/2ML IJ SOLN
INTRAMUSCULAR | Status: AC
  Filled 2016-01-30: qty 2

## 2016-01-30 MED ORDER — METHOCARBAMOL 500 MG PO TABS
500.0000 mg | ORAL_TABLET | Freq: Four times a day (QID) | ORAL | Status: DC | PRN
  Administered 2016-01-30 – 2016-01-31 (×2): 500 mg via ORAL
  Filled 2016-01-30 (×2): qty 1

## 2016-01-30 MED ORDER — POLYETHYLENE GLYCOL 3350 17 G PO PACK: 17.0000 g | PACK | Freq: Every day | ORAL | Status: DC | PRN

## 2016-01-30 MED ORDER — CHLORHEXIDINE GLUCONATE 4 % EX LIQD: 60.0000 mL | Freq: Once | CUTANEOUS | Status: DC

## 2016-01-30 MED ORDER — METOCLOPRAMIDE HCL 5 MG PO TABS: 5.0000 mg | ORAL_TABLET | Freq: Three times a day (TID) | ORAL | Status: DC | PRN

## 2016-01-30 MED ORDER — CEFAZOLIN SODIUM-DEXTROSE 2-4 GM/100ML-% IV SOLN
INTRAVENOUS | Status: AC
  Filled 2016-01-30: qty 100

## 2016-01-30 MED ORDER — MIDAZOLAM HCL 2 MG/2ML IJ SOLN
INTRAMUSCULAR | Status: DC | PRN
  Administered 2016-01-30: 2 mg via INTRAVENOUS

## 2016-01-30 MED ORDER — CEFAZOLIN SODIUM-DEXTROSE 2-4 GM/100ML-% IV SOLN
2.0000 g | INTRAVENOUS | Status: AC
  Administered 2016-01-30: 2 g via INTRAVENOUS

## 2016-01-30 MED ORDER — BUPIVACAINE HCL (PF) 0.5 % IJ SOLN
INTRAMUSCULAR | Status: DC | PRN
  Administered 2016-01-30: 3 mL via INTRATHECAL

## 2016-01-30 MED ORDER — ACETAMINOPHEN 650 MG RE SUPP: 650.0000 mg | Freq: Four times a day (QID) | RECTAL | Status: DC | PRN

## 2016-01-30 MED ORDER — HYDROMORPHONE HCL 1 MG/ML IJ SOLN
INTRAMUSCULAR | Status: AC
  Filled 2016-01-30: qty 1

## 2016-01-30 MED ORDER — DOCUSATE SODIUM 100 MG PO CAPS
100.0000 mg | ORAL_CAPSULE | Freq: Two times a day (BID) | ORAL | Status: DC
  Administered 2016-01-30 – 2016-01-31 (×2): 100 mg via ORAL
  Filled 2016-01-30 (×2): qty 1

## 2016-01-30 MED ORDER — ASPIRIN EC 325 MG PO TBEC
325.0000 mg | DELAYED_RELEASE_TABLET | Freq: Every day | ORAL | Status: DC
  Administered 2016-01-31: 325 mg via ORAL
  Filled 2016-01-30: qty 1

## 2016-01-30 MED ORDER — KETOROLAC TROMETHAMINE 30 MG/ML IJ SOLN
INTRAMUSCULAR | Status: AC
  Filled 2016-01-30: qty 1

## 2016-01-30 MED ORDER — OXYCODONE HCL 5 MG PO TABS
5.0000 mg | ORAL_TABLET | ORAL | Status: DC | PRN
  Administered 2016-01-30: 5 mg via ORAL
  Administered 2016-01-30 – 2016-01-31 (×5): 10 mg via ORAL
  Filled 2016-01-30 (×5): qty 2

## 2016-01-30 MED ORDER — ONDANSETRON HCL 4 MG PO TABS: 4.0000 mg | ORAL_TABLET | Freq: Four times a day (QID) | ORAL | Status: DC | PRN

## 2016-01-30 MED ORDER — ONDANSETRON HCL 4 MG/2ML IJ SOLN: 4.0000 mg | Freq: Four times a day (QID) | INTRAMUSCULAR | Status: DC | PRN

## 2016-01-30 MED ORDER — ASPIRIN EC 325 MG PO TBEC: 325.0000 mg | DELAYED_RELEASE_TABLET | Freq: Every day | ORAL | 0 refills | Status: DC

## 2016-01-30 MED ORDER — PHENYLEPHRINE 40 MCG/ML (10ML) SYRINGE FOR IV PUSH (FOR BLOOD PRESSURE SUPPORT)
PREFILLED_SYRINGE | INTRAVENOUS | Status: AC
  Filled 2016-01-30: qty 10

## 2016-01-30 MED ORDER — ACETAMINOPHEN 325 MG PO TABS
650.0000 mg | ORAL_TABLET | Freq: Four times a day (QID) | ORAL | Status: DC
  Administered 2016-01-30 – 2016-01-31 (×3): 650 mg via ORAL
  Filled 2016-01-30 (×3): qty 2

## 2016-01-30 MED ORDER — METHOCARBAMOL 500 MG PO TABS
ORAL_TABLET | ORAL | Status: AC
  Filled 2016-01-30: qty 1

## 2016-01-30 MED ORDER — LIDOCAINE 2% (20 MG/ML) 5 ML SYRINGE
INTRAMUSCULAR | Status: AC
  Filled 2016-01-30: qty 5

## 2016-01-30 MED ORDER — MENTHOL 3 MG MT LOZG: 1.0000 | LOZENGE | OROMUCOSAL | Status: DC | PRN

## 2016-01-30 MED ORDER — SORBITOL 70 % SOLN: 30.0000 mL | Freq: Every day | Status: DC | PRN

## 2016-01-30 MED ORDER — HYDROMORPHONE HCL 1 MG/ML IJ SOLN
0.2500 mg | INTRAMUSCULAR | Status: DC | PRN
  Administered 2016-01-30 (×2): 0.5 mg via INTRAVENOUS

## 2016-01-30 MED ORDER — ATORVASTATIN CALCIUM 10 MG PO TABS
10.0000 mg | ORAL_TABLET | Freq: Every day | ORAL | Status: DC
  Administered 2016-01-31: 10 mg via ORAL
  Filled 2016-01-30: qty 1

## 2016-01-30 MED ORDER — SODIUM CHLORIDE 0.9 % IV SOLN
2000.0000 mg | INTRAVENOUS | Status: DC
  Filled 2016-01-30: qty 20

## 2016-01-30 MED ORDER — BUPIVACAINE-EPINEPHRINE (PF) 0.25% -1:200000 IJ SOLN
INTRAMUSCULAR | Status: AC
  Filled 2016-01-30: qty 30

## 2016-01-30 SURGICAL SUPPLY — 52 items
APL SKNCLS STERI-STRIP NONHPOA (GAUZE/BANDAGES/DRESSINGS) ×1
BAG DECANTER FOR FLEXI CONT (MISCELLANEOUS) IMPLANT
BENZOIN TINCTURE PRP APPL 2/3 (GAUZE/BANDAGES/DRESSINGS) ×3 IMPLANT
BLADE SAG 18X100X1.27 (BLADE) IMPLANT
BLADE SAW SGTL 18X1.27X75 (BLADE) ×2 IMPLANT
BLADE SAW SGTL 18X1.27X75MM (BLADE) ×1
CAPT HIP TOTAL 2 ×3 IMPLANT
CLOSURE STERI-STRIP 1/2X4 (GAUZE/BANDAGES/DRESSINGS) ×1
CLSR STERI-STRIP ANTIMIC 1/2X4 (GAUZE/BANDAGES/DRESSINGS) ×2 IMPLANT
COVER PERINEAL POST (MISCELLANEOUS) ×3 IMPLANT
COVER SURGICAL LIGHT HANDLE (MISCELLANEOUS) ×6 IMPLANT
DRAPE C-ARM 42X72 X-RAY (DRAPES) ×3 IMPLANT
DRAPE STERI IOBAN 125X83 (DRAPES) ×3 IMPLANT
DRAPE U-SHAPE 47X51 STRL (DRAPES) ×6 IMPLANT
DRSG MEPILEX BORDER 4X8 (GAUZE/BANDAGES/DRESSINGS) ×3 IMPLANT
DURAPREP 26ML APPLICATOR (WOUND CARE) ×3 IMPLANT
ELECT BLADE 4.0 EZ CLEAN MEGAD (MISCELLANEOUS) ×3
ELECT REM PT RETURN 9FT ADLT (ELECTROSURGICAL) ×3
ELECTRODE BLDE 4.0 EZ CLN MEGD (MISCELLANEOUS) ×1 IMPLANT
ELECTRODE REM PT RTRN 9FT ADLT (ELECTROSURGICAL) ×1 IMPLANT
FACESHIELD WRAPAROUND (MASK) ×6 IMPLANT
GLOVE BIO SURGEON STRL SZ7.5 (GLOVE) ×9 IMPLANT
GLOVE BIOGEL PI IND STRL 8 (GLOVE) ×2 IMPLANT
GLOVE BIOGEL PI INDICATOR 8 (GLOVE) ×4
GOWN STRL REUS W/ TWL LRG LVL3 (GOWN DISPOSABLE) ×2 IMPLANT
GOWN STRL REUS W/ TWL XL LVL3 (GOWN DISPOSABLE) ×1 IMPLANT
GOWN STRL REUS W/TWL LRG LVL3 (GOWN DISPOSABLE) ×6
GOWN STRL REUS W/TWL XL LVL3 (GOWN DISPOSABLE) ×3
KIT BASIN OR (CUSTOM PROCEDURE TRAY) ×3 IMPLANT
KIT ROOM TURNOVER OR (KITS) ×3 IMPLANT
MANIFOLD NEPTUNE II (INSTRUMENTS) ×3 IMPLANT
NDL SAFETY ECLIPSE 18X1.5 (NEEDLE) ×1 IMPLANT
NEEDLE 18GX1X1/2 (RX/OR ONLY) (NEEDLE) ×3 IMPLANT
NEEDLE HYPO 18GX1.5 SHARP (NEEDLE) ×3
NS IRRIG 1000ML POUR BTL (IV SOLUTION) ×3 IMPLANT
PACK TOTAL JOINT (CUSTOM PROCEDURE TRAY) ×3 IMPLANT
PACK UNIVERSAL I (CUSTOM PROCEDURE TRAY) ×3 IMPLANT
PAD ARMBOARD 7.5X6 YLW CONV (MISCELLANEOUS) ×3 IMPLANT
SPONGE LAP 18X18 X RAY DECT (DISPOSABLE) ×3 IMPLANT
SUT MNCRL AB 4-0 PS2 18 (SUTURE) ×3 IMPLANT
SUT MON AB 2-0 CT1 36 (SUTURE) ×6 IMPLANT
SUT VIC AB 0 CT1 27 (SUTURE) ×3
SUT VIC AB 0 CT1 27XBRD ANBCTR (SUTURE) ×1 IMPLANT
SUT VIC AB 1 CT1 27 (SUTURE) ×3
SUT VIC AB 1 CT1 27XBRD ANBCTR (SUTURE) ×1 IMPLANT
SYR 50ML LL SCALE MARK (SYRINGE) ×3 IMPLANT
SYRINGE 20CC LL (MISCELLANEOUS) IMPLANT
TOWEL OR 17X24 6PK STRL BLUE (TOWEL DISPOSABLE) ×3 IMPLANT
TOWEL OR 17X26 10 PK STRL BLUE (TOWEL DISPOSABLE) ×3 IMPLANT
TRAY FOLEY CATH 16FRSI W/METER (SET/KITS/TRAYS/PACK) IMPLANT
WATER STERILE IRR 1000ML POUR (IV SOLUTION) ×3 IMPLANT
YANKAUER SUCT BULB TIP NO VENT (SUCTIONS) ×3 IMPLANT

## 2016-01-30 NOTE — Op Note (Signed)
01/30/2016  9:11 AM  PATIENT:  Andrea Spencer   MRN: [id number]  PRE-OPERATIVE DIAGNOSIS:  OA LEFT HIP  POST-OPERATIVE DIAGNOSIS:  OA LEFT HIP  PROCEDURE:  Procedure(s): TOTAL HIP ARTHROPLASTY ANTERIOR APPROACH  PREOPERATIVE INDICATIONS:    Andrea Spencer is an 70 y.o. female who has a diagnosis of Primary osteoarthritis of left hip and elected for surgical management after failing conservative treatment.  The risks benefits and alternatives were discussed with the patient including but not limited to the risks of nonoperative treatment, versus surgical intervention including infection, bleeding, nerve injury, periprosthetic fracture, the need for revision surgery, dislocation, leg length discrepancy, blood clots, cardiopulmonary complications, morbidity, mortality, among others, and they were willing to proceed.     OPERATIVE REPORT     SURGEON:   Jamaiyah Pyle, Ernesta Amble, MD    ASSISTANT:  Roxan Hockey, PA-C, he was present and scrubbed throughout the case, critical for completion in a timely fashion, and for retraction, instrumentation, and closure.     ANESTHESIA:  General    COMPLICATIONS:  None.     COMPONENTS:  Stryker acolade fit femur size 3 with a 36 mm +0 head ball and a PSL acetabular shell size 48 with a  polyethylene liner    PROCEDURE IN DETAIL:   The patient was met in the holding area and  identified.  The appropriate hip was identified and marked at the operative site.  The patient was then transported to the OR  and  placed under anesthesia per that record.  At that point, the patient was  placed in the supine position and  secured to the operating room table and all bony prominences padded. He received pre-operative antibiotics    The operative lower extremity was prepped from the iliac crest to the distal leg.  Sterile draping was performed.  Time out was performed prior to incision.      Skin incision was made just 2 cm lateral to the ASIS  extending in  line with the tensor fascia lata. Electrocautery was used to control all bleeders. I dissected down sharply to the fascia of the tensor fascia lata was confirmed that the muscle fibers beneath were running posteriorly. I then incised the fascia over the superficial tensor fascia lata in line with the incision. The fascia was elevated off the anterior aspect of the muscle the muscle was retracted posteriorly and protected throughout the case. I then used electrocautery to incise the tensor fascia lata fascia control and all bleeders. Immediately visible was the fat over top of the anterior neck and capsule.  I removed the anterior fat from the capsule and elevated the rectus muscle off of the anterior capsule. I then removed a large time of capsule. The retractors were then placed over the anterior acetabulum as well as around the superior and inferior neck.  I then removed a section of the femoral neck and a napkin ring fashion. Then used the power course to remove the femoral head from the acetabulum and thoroughly irrigated the acetabulum. I sized the femoral head.    I then exposed the deep acetabulum, cleared out any tissue including the ligamentum teres.   After adequate visualization, I excised the labrum, and then sequentially reamed.  I then impacted the acetabular implant into place using fluoroscopy for guidance.  Appropriate version and inclination was confirmed clinically matching their bony anatomy, and with fluoroscopy.  I placed a 63m screw in the posterior/superio position with an excellent bite.  I then placed the polyethylene liner in place  I then adducted the leg and released the external rotators from the posterior femur allowing it to be easily delivered up lateral and anterior to the acetabulum for preparation of the femoral canal.    I then prepared the proximal femur using the cookie-cutter and then sequentially reamed and broached.  A trial broach, neck, and head was  utilized, and I reduced the hip and used floroscopy to assess the neck length and femoral implant.  I then impacted the femoral prosthesis into place into the appropriate version. The hip was then reduced and fluoroscopy confirmed appropriate position. Leg lengths were restored.  I then irrigated the hip copiously again with, and repaired the fascia with Vicryl, followed by monocryl for the subcutaneous tissue, Monocryl for the skin, Steri-Strips and sterile gauze. The patient was then awakened and returned to PACU in stable and satisfactory condition. There were no complications.  POST OPERATIVE PLAN: WBAT, DVT px: SCD's/TED and [place], MD Orthopedic Surgeon [phone]

## 2016-01-30 NOTE — Anesthesia Procedure Notes (Signed)
Spinal  Patient location during procedure: OR Staffing Anesthesiologist: Rayen Palen Performed: anesthesiologist  Preanesthetic Checklist Completed: patient identified, site marked, surgical consent, pre-op evaluation, timeout performed, IV checked, risks and benefits discussed and monitors and equipment checked Spinal Block Patient position: sitting Prep: DuraPrep Patient monitoring: heart rate, cardiac monitor, continuous pulse ox and blood pressure Approach: midline Location: L3-4 Injection technique: single-shot Needle Needle type: Pencan  Needle gauge: 24 G Additional Notes Functioning IV was confirmed and monitors were applied. Sterile prep and drape, including hand hygiene, mask and sterile gloves were used. The patient was positioned and the spine was prepped. The skin was anesthetized with lidocaine.  Free flow of clear CSF was obtained prior to injecting local anesthetic into the CSF.  The spinal needle aspirated freely following injection.  The needle was carefully withdrawn.  The patient tolerated the procedure well. Consent was obtained prior to procedure with all questions answered and concerns addressed.  Andrea Pink, MD

## 2016-01-30 NOTE — Discharge Instructions (Signed)
INSTRUCTIONS AFTER JOINT REPLACEMENT  ° °o Remove items at home which could result in a fall. This includes throw rugs or furniture in walking pathways °o ICE to the affected joint every three hours while awake for 30 minutes at a time, for at least the first 3-5 days, and then as needed for pain and swelling.  Continue to use ice for pain and swelling. You may notice swelling that will progress down to the foot and ankle.  This is normal after surgery.  Elevate your leg when you are not up walking on it.   °o Continue to use the breathing machine you got in the hospital (incentive spirometer) which will help keep your temperature down.  It is common for your temperature to cycle up and down following surgery, especially at night when you are not up moving around and exerting yourself.  The breathing machine keeps your lungs expanded and your temperature down. ° ° °DIET:  As you were doing prior to hospitalization, we recommend a well-balanced diet. ° °DRESSING / WOUND CARE / SHOWERING ° °Keep the surgical dressing until follow up.  IF THE DRESSING FALLS OFF or the wound gets wet inside, change the dressing with sterile gauze.  Please use good hand washing techniques before changing the dressing.  Do not use any lotions or creams on the incision until instructed by your surgeon.   ° °ACTIVITY ° °o Increase activity slowly as tolerated, but follow the weight bearing instructions below.   °o No driving for 6 weeks or until further direction given by your physician.  You cannot drive while taking narcotics.  °o No lifting or carrying greater than 10 lbs. until further directed by your surgeon. °o Avoid periods of inactivity such as sitting longer than an hour when not asleep. This helps prevent blood clots.  °o You may return to work once you are authorized by your doctor.  ° ° ° °WEIGHT BEARING  ° °Weight bearing as tolerated with assist device (walker, cane, etc) as directed, use it as long as suggested by your  surgeon or therapist, typically at least 4-6 weeks. ° ° °EXERCISES ° °Results after joint replacement surgery are often greatly improved when you follow the exercise, range of motion and muscle strengthening exercises prescribed by your doctor. Safety measures are also important to protect the joint from further injury. Any time any of these exercises cause you to have increased pain or swelling, decrease what you are doing until you are comfortable again and then slowly increase them. If you have problems or questions, call your caregiver or physical therapist for advice.  ° °Rehabilitation is important following a joint replacement. After just a few days of immobilization, the muscles of the leg can become weakened and shrink (atrophy).  These exercises are designed to build up the tone and strength of the thigh and leg muscles and to improve motion. Often times heat used for twenty to thirty minutes before working out will loosen up your tissues and help with improving the range of motion but do not use heat for the first two weeks following surgery (sometimes heat can increase post-operative swelling).  ° °These exercises can be done on a training (exercise) mat, on the floor, on a table or on a bed. Use whatever works the best and is most comfortable for you.    Use music or television while you are exercising so that the exercises are a pleasant break in your day. This will make your life   better with the exercises acting as a break in your routine that you can look forward to.   Perform all exercises about fifteen times, three times per day or as directed.  You should exercise both the operative leg and the other leg as well. ° °Exercises include: °  °• Quad Sets - Tighten up the muscle on the front of the thigh (Quad) and hold for 5-10 seconds.   °• Straight Leg Raises - With your knee straight (if you were given a brace, keep it on), lift the leg to 60 degrees, hold for 3 seconds, and slowly lower the leg.   Perform this exercise against resistance later as your leg gets stronger.  °• Leg Slides: Lying on your back, slowly slide your foot toward your buttocks, bending your knee up off the floor (only go as far as is comfortable). Then slowly slide your foot back down until your leg is flat on the floor again.  °• Angel Wings: Lying on your back spread your legs to the side as far apart as you can without causing discomfort.  °• Hamstring Strength:  Lying on your back, push your heel against the floor with your leg straight by tightening up the muscles of your buttocks.  Repeat, but this time bend your knee to a comfortable angle, and push your heel against the floor.  You may put a pillow under the heel to make it more comfortable if necessary.  ° °A rehabilitation program following joint replacement surgery can speed recovery and prevent re-injury in the future due to weakened muscles. Contact your doctor or a physical therapist for more information on knee rehabilitation.  ° ° °CONSTIPATION ° °Constipation is defined medically as fewer than three stools per week and severe constipation as less than one stool per week.  Even if you have a regular bowel pattern at home, your normal regimen is likely to be disrupted due to multiple reasons following surgery.  Combination of anesthesia, postoperative narcotics, change in appetite and fluid intake all can affect your bowels.  ° °YOU MUST use at least one of the following options; they are listed in order of increasing strength to get the job done.  They are all available over the counter, and you may need to use some, POSSIBLY even all of these options:   ° °Drink plenty of fluids (prune juice may be helpful) and high fiber foods °Colace 100 mg by mouth twice a day  °Senokot for constipation as directed and as needed Dulcolax (bisacodyl), take with full glass of water  °Miralax (polyethylene glycol) once or twice a day as needed. ° °If you have tried all these things and  are unable to have a bowel movement in the first 3-4 days after surgery call either your surgeon or your primary doctor.   ° °If you experience loose stools or diarrhea, hold the medications until you stool forms back up.  If your symptoms do not get better within 1 week or if they get worse, check with your doctor.  If you experience "the worst abdominal pain ever" or develop nausea or vomiting, please contact the office immediately for further recommendations for treatment. ° ° °ITCHING:  If you experience itching with your medications, try taking only a single pain pill, or even half a pain pill at a time.  You can also use Benadryl over the counter for itching or also to help with sleep.  ° °TED HOSE STOCKINGS:  Use stockings on both legs   until for at least 2 weeks or as directed by physician office. They may be removed at night for sleeping.  MEDICATIONS:  See your medication summary on the After Visit Summary that nursing will review with you.  You may have some home medications which will be placed on hold until you complete the course of blood thinner medication.  It is important for you to complete the blood thinner medication as prescribed.  PRECAUTIONS:  If you experience chest pain or shortness of breath - call 911 immediately for transfer to the hospital emergency department.   If you develop a fever greater that 101 F, purulent drainage from wound, increased redness or drainage from wound, foul odor from the wound/dressing, or calf pain - CONTACT YOUR SURGEON.                                                   FOLLOW-UP APPOINTMENTS:  If you do not already have a post-op appointment, please call the office for an appointment to be seen by your surgeon.  Guidelines for how soon to be seen are listed in your After Visit Summary, but are typically between 1-4 weeks after surgery.  OTHER INSTRUCTIONS:    MAKE SURE YOU:   Understand these instructions.   Get help right away if you are  not doing well or get worse.    Thank you for letting us be a part of your medical care team.  It is a privilege we respect greatly.  We hope these instructions will help you stay on track for a fast and full recovery!

## 2016-01-30 NOTE — Anesthesia Preprocedure Evaluation (Addendum)
Anesthesia Evaluation  Patient identified by MRN, date of birth, ID band Patient awake    Reviewed: Allergy & Precautions, NPO status , Patient's Chart, lab work & pertinent test results  Airway Mallampati: II  TM Distance: >3 FB Neck ROM: Full    Dental no notable dental hx. (+) Teeth Intact, Dental Advisory Given   Pulmonary neg pulmonary ROS,    Pulmonary exam normal breath sounds clear to auscultation       Cardiovascular negative cardio ROS Normal cardiovascular exam Rhythm:Regular Rate:Normal     Neuro/Psych  Headaches, negative psych ROS   GI/Hepatic negative GI ROS, Neg liver ROS,   Endo/Other  negative endocrine ROS  Renal/GU negative Renal ROS     Musculoskeletal  (+) Arthritis ,   Abdominal   Peds  Hematology negative hematology ROS (+)   Anesthesia Other Findings   Reproductive/Obstetrics negative OB ROS                            Anesthesia Physical  Anesthesia Plan  ASA: II  Anesthesia Plan: Spinal   Post-op Pain Management:    Induction: Intravenous  Airway Management Planned: Simple Face Mask  Additional Equipment:   Intra-op Plan:   Post-operative Plan:   Informed Consent: I have reviewed the patients History and Physical, chart, labs and discussed the procedure including the risks, benefits and alternatives for the proposed anesthesia with the patient or authorized representative who has indicated his/her understanding and acceptance.   Dental advisory given  Plan Discussed with: CRNA  Anesthesia Plan Comments:         Anesthesia Quick Evaluation

## 2016-01-30 NOTE — Transfer of Care (Signed)
Immediate Anesthesia Transfer of Care Note  Patient: Andrea Spencer  Procedure(s) Performed: Procedure(s): TOTAL HIP ARTHROPLASTY ANTERIOR APPROACH (Left)  Patient Location: PACU  Anesthesia Type:General and Spinal  Level of Consciousness: awake, alert  and oriented  Airway & Oxygen Therapy: Patient Spontanous Breathing and Patient connected to nasal cannula oxygen  Post-op Assessment: Report given to RN and Post -op Vital signs reviewed and stable  Post vital signs: Reviewed and stable  Last Vitals:  Vitals:   01/30/16 0555 01/30/16 1009  BP: (!) 142/85   Pulse: 74   Resp: 20   Temp: 36.8 C 36.3 C    Last Pain:  Vitals:   01/30/16 0625  TempSrc:   PainSc: 7          Complications: No apparent anesthesia complications

## 2016-01-30 NOTE — Anesthesia Procedure Notes (Signed)
Procedure Name: LMA Insertion Date/Time: 01/30/2016 8:11 AM Performed by: Ollen Bowl Pre-anesthesia Checklist: Patient identified, Emergency Drugs available, Suction available, Patient being monitored and Timeout performed Patient Re-evaluated:Patient Re-evaluated prior to inductionOxygen Delivery Method: Circle system utilized and Simple face mask Preoxygenation: Pre-oxygenation with 100% oxygen Intubation Type: IV induction Ventilation: Mask ventilation without difficulty LMA: LMA inserted LMA Size: 4.0 Number of attempts: 1 Airway Equipment and Method: Patient positioned with wedge pillow Placement Confirmation: positive ETCO2 and breath sounds checked- equal and bilateral Tube secured with: Tape Dental Injury: Teeth and Oropharynx as per pre-operative assessment

## 2016-01-30 NOTE — Interval H&P Note (Signed)
History and Physical Interval Note:  01/30/2016 7:36 AM  Ike Bene  has presented today for surgery, with the diagnosis of OA LEFT HIP  The various methods of treatment have been discussed with the patient and family. After consideration of risks, benefits and other options for treatment, the patient has consented to  Procedure(s): TOTAL HIP ARTHROPLASTY ANTERIOR APPROACH (Left) as a surgical intervention .  The patient's history has been reviewed, patient examined, no change in status, stable for surgery.  I have reviewed the patient's chart and labs.  Questions were answered to the patient's satisfaction.     MURPHY, TIMOTHY D

## 2016-01-30 NOTE — Anesthesia Postprocedure Evaluation (Signed)
Anesthesia Post Note  Patient: Ike Bene  Procedure(s) Performed: Procedure(s) (LRB): TOTAL HIP ARTHROPLASTY ANTERIOR APPROACH (Left)  Patient location during evaluation: PACU Anesthesia Type: General Level of consciousness: awake and alert Pain management: pain level controlled Vital Signs Assessment: post-procedure vital signs reviewed and stable Respiratory status: spontaneous breathing, nonlabored ventilation, respiratory function stable and patient connected to nasal cannula oxygen Cardiovascular status: blood pressure returned to baseline and stable Postop Assessment: no signs of nausea or vomiting Anesthetic complications: no    Last Vitals:  Vitals:   01/30/16 1030 01/30/16 1055  BP:    Pulse: 75 79  Resp: 13 13  Temp:      Last Pain:  Vitals:   01/30/16 1055  TempSrc:   PainSc: 3                  Zenaida Deed

## 2016-01-30 NOTE — Progress Notes (Signed)
Lunch relief by D. Rees RN 

## 2016-01-30 NOTE — Anesthesia Procedure Notes (Deleted)
Anesthesia Regional Block: Narrative:       

## 2016-01-30 NOTE — Progress Notes (Signed)
01/30/2016 1745 Received pt to 5N25 from PACU.  Pt is post L total hip.  C/O pain, will medicate.  L hip site looks WNL, ice pack to site.  Oriented to room, call light and bed.  Call bell in reach, family at bedside. [place]

## 2016-01-31 ENCOUNTER — Encounter (HOSPITAL_COMMUNITY): Payer: Self-pay | Admitting: Orthopedic Surgery

## 2016-01-31 DIAGNOSIS — M1612 Unilateral primary osteoarthritis, left hip: Secondary | ICD-10-CM | POA: Diagnosis not present

## 2016-01-31 LAB — CBC
HCT: 37.1 % (ref 36.0–46.0)
HEMOGLOBIN: 11.5 g/dL — AB (ref 12.0–15.0)
MCH: 29.6 pg (ref 26.0–34.0)
MCHC: 31 g/dL (ref 30.0–36.0)
MCV: 95.4 fL (ref 78.0–100.0)
Platelets: 242 10*3/uL (ref 150–400)
RBC: 3.89 MIL/uL (ref 3.87–5.11)
RDW: 12.3 % (ref 11.5–15.5)
WBC: 12.3 10*3/uL — ABNORMAL HIGH (ref 4.0–10.5)

## 2016-01-31 NOTE — Evaluation (Signed)
Physical Therapy Evaluation Patient Details Name: Andrea Spencer MRN: [id number] DOB: August 02, 1945 Today's Date: 01/31/2016   History of Present Illness  Pt is 70 y/o female s/p L THA. History of arthritis, breast cancer.  Clinical Impression  Pt presented sitting OOB in recliner when PT entered room. Prior to admission, pt stated that she was independent with all functional mobility. Pt moved well during evaluation and successfully completed stair training. PT gave pt therex handout and reviewed with pt as well. Pt would continue to benefit from skilled physical therapy services at this time while admitted and after d/c to address her below listed limitations in order to improve her overall safety and independence with functional mobility.     Follow Up Recommendations Home health PT;Supervision for mobility/OOB    Equipment Recommendations  Rolling walker with 5" wheels    Recommendations for Other Services       Precautions / Restrictions Precautions Precautions: None Restrictions Weight Bearing Restrictions: Yes LLE Weight Bearing: Weight bearing as tolerated      Mobility  Bed Mobility               General bed mobility comments: pt sitting OOB in recliner when PT entered room  Transfers Overall transfer level: Needs assistance Equipment used: Rolling walker (2 wheeled) Transfers: Sit to/from Stand Sit to Stand: Supervision         General transfer comment: pt required increased time and VC'ing for bilateral hand placement  Ambulation/Gait Ambulation/Gait assistance: Min guard Ambulation Distance (Feet): 150 Feet (150 ft x2 with stair training in between) Assistive device: Rolling walker (2 wheeled) Gait Pattern/deviations: Step-to pattern;Decreased step length - right;Decreased stance time - left;Decreased weight shift to left;Antalgic Gait velocity: decreased Gait velocity interpretation: Below normal speed for age/gender General Gait Details: pt  demonstrated safety with RW  Stairs Stairs: Yes Stairs assistance: Min guard Stair Management: One rail Right;Step to pattern;Forwards Number of Stairs: 2 General stair comments: pt ascended with R LE leading and descended with L LE leading  Wheelchair Mobility    Modified Rankin (Stroke Patients Only)       Balance Overall balance assessment: Needs assistance Sitting-balance support: Feet supported;No upper extremity supported Sitting balance-Leahy Scale: Fair     Standing balance support: During functional activity;Bilateral upper extremity supported Standing balance-Leahy Scale: Poor Standing balance comment: needs BUE support on RW during ambulation, and sink level ADL                             Pertinent Vitals/Pain Pain Assessment: 0-10 Pain Score: 7  Pain Location: L hip Pain Descriptors / Indicators: Grimacing;Guarding;Sore Pain Intervention(s): Monitored during session;Repositioned    Home Living Family/patient expects to be discharged to:: Private residence Living Arrangements: Spouse/significant other Available Help at Discharge: Family Type of Home: House Home Access: Stairs to enter Entrance Stairs-Rails: Right Entrance Stairs-Number of Steps: 4 Home Layout: Able to live on main level with bedroom/bathroom;Two level Home Equipment: Shower seat - built in;Hand held shower head Additional [place]    Prior Function Level of Independence: Independent               Hand Dominance   Dominant Hand: Right    Extremity/Trunk Assessment   Upper Extremity Assessment: Defer to OT evaluation           Lower Extremity Assessment: LLE deficits/detail   LLE Deficits / Details: Pt with decreased strength and ROM  limitations secondary to post op.  Cervical / Trunk Assessment: Normal  Communication   Communication: No difficulties  Cognition Arousal/Alertness: Awake/alert Behavior During Therapy: WFL for tasks  assessed/performed Overall Cognitive Status: Within Functional Limits for tasks assessed                      General Comments      Exercises Total Joint Exercises Quad Sets: AROM;Left;5 reps;Seated Long Arc Quad: AROM;Left;5 reps;Seated Marching in Standing: AROM;Both;10 reps;Seated   Assessment/Plan    PT Assessment Patient needs continued PT services  PT Problem List Decreased strength;Decreased range of motion;Decreased activity tolerance;Decreased balance;Decreased coordination;Decreased cognition;Decreased mobility;Decreased knowledge of use of DME;Pain          PT Treatment Interventions DME instruction;Gait training;Stair training;Therapeutic activities;Functional mobility training;Therapeutic exercise;Neuromuscular re-education;Balance training;Patient/family education    PT Goals (Current goals can be found in the Care Plan section)  Acute Rehab PT Goals Patient Stated Goal: to return home today PT Goal Formulation: With patient/family Time For Goal Achievement: 02/14/16 Potential to Achieve Goals: Good    Frequency 7X/week   Barriers to discharge        Co-evaluation               End of Session Equipment Utilized During Treatment: Gait belt Activity Tolerance: Patient limited by pain;Patient limited by fatigue Patient left: in chair;with call bell/phone within reach;with family/visitor present Nurse Communication: Mobility status         Time: [id number] PT Time Calculation (min) (ACUTE ONLY): 28 min   Charges:   PT Evaluation $PT Eval Moderate Complexity: 1 Procedure PT Treatments $Gait Training: 8-22 mins   PT G CodesClearnce Sorrel Yannick Steuber 01/31/2016, 11:18 AM Sherie Don, PT, DPT [phone]

## 2016-01-31 NOTE — Progress Notes (Signed)
Pt ready for discharge. Education/instructions reviewed with pt and husband and all questions/concerns addressed. IV removed and belongings gathered. Pt will be transported out via wheelchair to husband's vehicle. Will continue to monitor

## 2016-01-31 NOTE — Evaluation (Signed)
Occupational Therapy Evaluation and Discharge Patient Details Name: Andrea Spencer MRN: [id number] DOB: [birth year] Today's Date: 01/31/2016    History of Present Illness Pt is 70 y/o female post L total hip. History of arthritis, breast cancer.   Clinical Impression   PTA Pt independent in ADL and IADL. Pt pleasant and willing to work with therapies. Pt progressing well in self-care activities post-op (see list below) and is safe to d/c home with husband. Pt and husband open to education on safety, sequencing, and minimal AE. No further OT needs at this time.     Follow Up Recommendations  No OT follow up;Supervision - Intermittent    Equipment Recommendations  3 in 1 bedside comode    Recommendations for Other Services       Precautions / Restrictions Precautions Precautions: None Restrictions Weight Bearing Restrictions: Yes LLE Weight Bearing: Weight bearing as tolerated      Mobility Bed Mobility               General bed mobility comments: Pt sitting EOB with nursing once OT entered the room  Transfers Overall transfer level: Needs assistance Equipment used: Rolling walker (2 wheeled) Transfers: Sit to/from Stand Sit to Stand: Supervision         General transfer comment: vc for safe handplacement on rolling walker    Balance Overall balance assessment: Needs assistance Sitting-balance support: No upper extremity supported;Feet supported Sitting balance-Leahy Scale: Fair     Standing balance support: During functional activity;Bilateral upper extremity supported Standing balance-Leahy Scale: Poor Standing balance comment: needs BUE support on RW during ambulation, and sink level ADL                            ADL Overall ADL's : Needs assistance/impaired Eating/Feeding: Modified independent;Sitting   Grooming: Wash/dry hands;Wash/dry face;Oral care;Brushing hair;Min guard;Standing Grooming Details (indicate cue type and  reason): at sink     Lower Body Bathing: Supervison/ safety;Sitting/lateral leans Lower Body Bathing Details (indicate cue type and reason): educated in long handle sponge Upper Body Dressing : Modified independent Upper Body Dressing Details (indicate cue type and reason): able to don dressing robe Lower Body Dressing: Minimal assistance Lower Body Dressing Details (indicate cue type and reason): Able to reach to toes, and husband able to assist for LB dressing Toilet Transfer: Supervision/safety;Ambulation;RW;Comfort height toilet;Grab bars Toilet Transfer Details (indicate cue type and reason): Pt reliant on use of grab bars for transfer sit/stand Toileting- Clothing Manipulation and Hygiene: Supervision/safety   Tub/ Shower Transfer: Walk-in shower;Supervision/safety;Ambulation;Shower seat;Rolling walker;Grab Paediatric nurse Details (indicate cue type and reason): Pt educated to have husband with her for safety at first due to wet surfaces Functional mobility during ADLs: Supervision/safety;Rolling walker       Vision     Perception     Praxis      Pertinent Vitals/Pain Pain Assessment: 0-10 Pain Score: 6  Pain Location: L hip Pain Descriptors / Indicators: Tightness;Discomfort;Sore Pain Intervention(s): Monitored during session;Limited activity within patient's tolerance;RN gave pain meds during session;Repositioned     Hand Dominance Right   Extremity/Trunk Assessment Upper Extremity Assessment Upper Extremity Assessment: Overall WFL for tasks assessed   Lower Extremity Assessment Lower Extremity Assessment: LLE deficits/detail LLE Deficits / Details: post op deficits in ROM and strength L hip replacement   Cervical / Trunk Assessment Cervical / Trunk Assessment: Normal   Communication Communication Communication: No difficulties   Cognition Arousal/Alertness: Awake/alert Behavior During  Therapy: WFL for tasks assessed/performed Overall Cognitive  Status: Within Functional Limits for tasks assessed                     General Comments       Exercises       Shoulder Instructions      Home Living Family/patient expects to be discharged to:: Private residence Living Arrangements: Spouse/significant other Available Help at Discharge: Family Type of Home: House Home Access: Stairs to enter Technical brewer of Steps: 4 Entrance Stairs-Rails: Right Home Layout: Able to live on main level with bedroom/bathroom;Two level     Bathroom Shower/Tub: Occupational psychologist: Handicapped height Bathroom Accessibility: Yes How Accessible: Accessible via walker Home Equipment: Shower seat - built in;Hand held shower head   Additional Comments: [place]      Prior Functioning/Environment Level of Independence: Independent                 OT Problem List: Pain;Decreased strength;Decreased range of motion   OT Treatment/Interventions:      OT Goals(Current goals can be found in the care plan section) Acute Rehab OT Goals Patient Stated Goal: To get home to dog OT Goal Formulation: With patient/family Time For Goal Achievement: 02/07/16 Potential to Achieve Goals: Good  OT Frequency:     Barriers to D/C:            Co-evaluation              End of Session Equipment Utilized During Treatment: Surveyor, mining Communication: Other (comment) (Pt with PT)  Activity Tolerance: Patient tolerated treatment well Patient left: in chair;with call bell/phone within reach;with family/visitor present;Other (comment) (beginning PT session)   Time: [phone] OT Time Calculation (min): 35 min Charges:  OT General Charges $OT Visit: 1 Procedure OT Evaluation $OT Eval Low Complexity: 1 Procedure OT Treatments $Self Care/Home Management : 8-22 mins G-Codes:    Andrea Spencer Andrea Spencer 2016-02-29, 9:14 AM  Hulda Humphrey OTR/L [phone]

## 2016-01-31 NOTE — Progress Notes (Signed)
Physical Therapy Treatment Patient Details Name: Andrea Spencer MRN: [id number] DOB: [birth year] Today's Date: 01/31/2016    History of Present Illness Pt is 70 y/o female s/p L THA. History of arthritis, breast cancer.    PT Comments    Pt presented supine in bed with HOB elevated, awake and willing to participate in therapy session. Pt again participating in stair training this session and demonstrated more confidence. Pt would continue to benefit from skilled physical therapy services at this time while admitted and after d/c to address her limitations in order to improve her overall safety and independence with functional mobility.   Follow Up Recommendations  Home health PT;Supervision for mobility/OOB     Equipment Recommendations  Rolling walker with 5" wheels    Recommendations for Other Services       Precautions / Restrictions Restrictions Weight Bearing Restrictions: Yes LLE Weight Bearing: Weight bearing as tolerated    Mobility  Bed Mobility Overal bed mobility: Modified Independent             General bed mobility comments: pt required increased time to complete  Transfers Overall transfer level: Needs assistance Equipment used: Rolling walker (2 wheeled) Transfers: Sit to/from Stand Sit to Stand: Supervision         General transfer comment: pt required increased time  Ambulation/Gait Ambulation/Gait assistance: Supervision Ambulation Distance (Feet): 150 Feet (150 ft x2 with stair training in between) Assistive device: Rolling walker (2 wheeled) Gait Pattern/deviations: Step-through pattern Gait velocity: decreased Gait velocity interpretation: Below normal speed for age/gender General Gait Details: initially pt needing extra time to initiate movement but able to progress to a step-through gait pattern that was relatively WFL with RW   Stairs Stairs: Yes Stairs assistance: Supervision Stair Management: One rail Right;Step to  pattern;Forwards Number of Stairs: 2 General stair comments: pt ascended with R LE leading and descended with L LE leading  Wheelchair Mobility    Modified Rankin (Stroke Patients Only)       Balance Overall balance assessment: Needs assistance Sitting-balance support: Feet supported;No upper extremity supported Sitting balance-Leahy Scale: Fair     Standing balance support: During functional activity;Bilateral upper extremity supported Standing balance-Leahy Scale: Poor                      Cognition Arousal/Alertness: Awake/alert Behavior During Therapy: WFL for tasks assessed/performed Overall Cognitive Status: Within Functional Limits for tasks assessed                      Exercises Total Joint Exercises Quad Sets: AROM;Left;5 reps;Seated Long Arc Quad: AROM;Left;5 reps;Seated Marching in Standing: AROM;Both;10 reps;Seated    General Comments        Pertinent Vitals/Pain Pain Assessment: Faces Pain Score: 7  Faces Pain Scale: Hurts a little bit Pain Location: L hip Pain Descriptors / Indicators: Tightness;Sore Pain Intervention(s): Monitored during session    Home Living Family/patient expects to be discharged to:: Private residence Living Arrangements: Spouse/significant other Available Help at Discharge: Family Type of Home: House Home Access: Stairs to enter Entrance Stairs-Rails: Right Home Layout: Able to live on main level with bedroom/bathroom;Two level Home Equipment: Shower seat - built in;Hand held shower head Additional Comments: [place]    Prior Function Level of Independence: Independent          PT Goals (current goals can now be found in the care plan section) Acute Rehab PT Goals Patient Stated Goal: to return  home today PT Goal Formulation: With patient/family Time For Goal Achievement: 02/14/16 Potential to Achieve Goals: Good Progress towards PT goals: Progressing toward goals    Frequency     7X/week      PT Plan Current plan remains appropriate    Co-evaluation             End of Session Equipment Utilized During Treatment: Gait belt Activity Tolerance: Patient tolerated treatment well Patient left: in bed;with call bell/phone within reach;with family/visitor present     Time: [id number] PT Time Calculation (min) (ACUTE ONLY): 13 min  Charges:  $Gait Training: 8-22 mins                    G CodesClearnce Sorrel Jacklynn Spencer 2016-02-19, 2:03 PM Sherie Don, Murphy, DPT [phone]

## 2016-01-31 NOTE — Discharge Summary (Signed)
Discharge Summary  Patient ID: Andrea Spencer MRN: [id number] DOB/AGE: 10/09/1945 70 y.o.  Admit date: 01/30/2016 Discharge date: 01/31/2016  Admission Diagnoses:  Primary osteoarthritis of left hip  Discharge Diagnoses:  Principal Problem:   Primary osteoarthritis of left hip   Past Medical History:  Diagnosis Date  . Arthritis   . Breast mass    right  . Cancer ([place])    skin cancer  . Headache   . History of bronchitis   . History of pneumonia     Surgeries: Procedure(s): TOTAL HIP ARTHROPLASTY ANTERIOR APPROACH on 01/30/2016   Consultants (if any):   Discharged Condition: Improved  Progress  Subjective: Feeling good.  OOB with walker to bathroom.  Pain controlled with PO meds.  Tolerating diet.  Urinating.  No CP, SOB.  Objective: General: NAD.   Resp: No increased WOB Cardio: regular rate and rhythm ABD soft Neurologically intact MSK Neurovascularly intact Sensation intact distally Intact pulses distally Dorsiflexion/Plantar flexion intact Incision: dressing C/D/I  Plan: Weight Bearing: Weight Bearing as Tolerated (WBAT) left leg Dressings: PRN.  VTE prophylaxis: Aspirin, ambulation, SCDs Dispo: Home  Hospital Course: Andrea Spencer is an 70 y.o. female who was admitted 01/30/2016 with a diagnosis of Primary osteoarthritis of left hip and went to the operating room on 01/30/2016 and underwent the above named procedures.    She was given perioperative antibiotics:  Anti-infectives    Start     Dose/Rate Route Frequency Ordered Stop   01/30/16 1815  ceFAZolin (ANCEF) IVPB 2g/100 mL premix     2 g 200 mL/hr over 30 Minutes Intravenous Every 6 hours 01/30/16 1807 01/31/16 0614   01/30/16 0555  ceFAZolin (ANCEF) 2-4 GM/100ML-% IVPB    Comments:  Ardine Eng   : cabinet override      01/30/16 0555 01/30/16 0800   01/30/16 0551  ceFAZolin (ANCEF) IVPB 2g/100 mL premix     2 g 200 mL/hr over 30 Minutes Intravenous On call to O.R. 01/30/16  0551 01/30/16 0800    .  She was given sequential compression devices, early ambulation, and ASA 325 mg for DVT prophylaxis.  She benefited maximally from the hospital stay and there were no complications.    Recent vital signs:  Vitals:   01/30/16 1955 01/31/16 0455  BP: 125/71 (!) 101/55  Pulse: 96 97  Resp: 19 19  Temp: 98.4 F (36.9 C) 98.2 F (36.8 C)    Recent laboratory studies:  Lab Results  Component Value Date   HGB 14.3 01/18/2016   HGB 14.7 04/05/2015   Lab Results  Component Value Date   WBC 8.0 01/18/2016   PLT 302 01/18/2016   Lab Results  Component Value Date   INR 1.00 01/18/2016   Lab Results  Component Value Date   NA 140 01/18/2016   K 4.0 01/18/2016   CL 103 01/18/2016   CO2 28 01/18/2016   BUN 12 01/18/2016   CREATININE 0.66 01/18/2016   GLUCOSE 101 (H) 01/18/2016    Discharge Medications:     Medication List    STOP taking these medications   HYDROcodone-acetaminophen 5-325 MG tablet Commonly known as:  NORCO/VICODIN     TAKE these medications   acetaminophen 650 MG CR tablet Commonly known as:  TYLENOL Take 1,300 mg by mouth every 8 (eight) hours as needed for pain.   aspirin EC 325 MG tablet Take 1 tablet (325 mg total) by mouth daily.   atorvastatin 10 MG tablet Commonly known as:  LIPITOR Take 10 mg by mouth daily.   Biotin 10 MG Caps Take 1,000 mg by mouth daily.   calcium carbonate 1250 (500 Ca) MG chewable tablet Commonly known as:  OS-CAL Chew 1 tablet by mouth daily.   cholecalciferol 1000 units tablet Commonly known as:  VITAMIN D Take 1,000 Units by mouth daily.   diclofenac 50 MG EC tablet Commonly known as:  VOLTAREN Take 50 mg by mouth 2 (two) times daily.   diphenhydramine-acetaminophen 25-500 MG Tabs tablet Commonly known as:  TYLENOL PM Take 1 tablet by mouth at bedtime as needed (sleep).   docusate sodium 100 MG capsule Commonly known as:  COLACE Take 1 capsule (100 mg total) by mouth 2  (two) times daily. To prevent constipation while taking pain medication.   glucosamine-chondroitin 500-400 MG tablet Take 2 tablets by mouth daily.   Krill Oil 1000 MG Caps Take 3 capsules by mouth daily.   methocarbamol 500 MG tablet Commonly known as:  ROBAXIN Take 1 tablet (500 mg total) by mouth every 6 (six) hours as needed for muscle spasms.   multivitamin with minerals Tabs tablet Take 1 tablet by mouth daily.   omeprazole 20 MG capsule Commonly known as:  PRILOSEC Take 1 capsule (20 mg total) by mouth daily. While taking anti inflammatory medicine daily   ondansetron 4 MG tablet Commonly known as:  ZOFRAN Take 1 tablet (4 mg total) by mouth every 8 (eight) hours as needed for nausea or vomiting.   oxyCODONE-acetaminophen 5-325 MG tablet Commonly known as:  ROXICET Take 1-2 tablets by mouth every 4 (four) hours as needed for severe pain.   vitamin C 250 MG tablet Commonly known as:  ASCORBIC ACID Take 250 mg by mouth daily.      Diagnostic Studies: Dg C-arm 1-60 Min  Result Date: 01/30/2016 CLINICAL DATA:  Left anterior total hip arthroplasty EXAM: DG C-ARM 61-120 MIN; OPERATIVE LEFT HIP WITH PELVIS COMPARISON:  None. FINDINGS: Changes of left hip replacement. Normal AP alignment. No hardware bony complicating feature noted. IMPRESSION: Left replacement.  No visible complicating feature. Electronically Signed   By: Rolm Baptise M.D.   On: 01/30/2016 09:46   Dg Hip Operative Unilat W Or W/o Pelvis Left  Result Date: 01/30/2016 CLINICAL DATA:  Left anterior total hip arthroplasty EXAM: DG C-ARM 61-120 MIN; OPERATIVE LEFT HIP WITH PELVIS COMPARISON:  None. FINDINGS: Changes of left hip replacement. Normal AP alignment. No hardware bony complicating feature noted. IMPRESSION: Left replacement.  No visible complicating feature. Electronically Signed   By: Rolm Baptise M.D.   On: 01/30/2016 09:46    Disposition: 01-Home or Self Care    Follow-up Information    MURPHY,  TIMOTHY D, MD. Schedule an appointment as soon as possible for a visit in 2 week(s).   Specialty:  Orthopedic Surgery Contact information: [address] [place] [address] [phone]            Signed: Prudencio Burly III PA-C 01/31/2016, 6:44 AM

## 2016-02-02 DIAGNOSIS — Z96642 Presence of left artificial hip joint: Secondary | ICD-10-CM | POA: Diagnosis not present

## 2016-02-02 DIAGNOSIS — Z7982 Long term (current) use of aspirin: Secondary | ICD-10-CM | POA: Diagnosis not present

## 2016-02-02 DIAGNOSIS — M1991 Primary osteoarthritis, unspecified site: Secondary | ICD-10-CM | POA: Diagnosis not present

## 2016-02-02 DIAGNOSIS — Z8701 Personal history of pneumonia (recurrent): Secondary | ICD-10-CM | POA: Diagnosis not present

## 2016-02-02 DIAGNOSIS — Z471 Aftercare following joint replacement surgery: Secondary | ICD-10-CM | POA: Diagnosis not present

## 2016-02-02 DIAGNOSIS — Z853 Personal history of malignant neoplasm of breast: Secondary | ICD-10-CM | POA: Diagnosis not present

## 2016-02-02 DIAGNOSIS — Z9181 History of falling: Secondary | ICD-10-CM | POA: Diagnosis not present

## 2016-02-05 DIAGNOSIS — Z96642 Presence of left artificial hip joint: Secondary | ICD-10-CM | POA: Diagnosis not present

## 2016-02-05 DIAGNOSIS — Z9181 History of falling: Secondary | ICD-10-CM | POA: Diagnosis not present

## 2016-02-05 DIAGNOSIS — Z471 Aftercare following joint replacement surgery: Secondary | ICD-10-CM | POA: Diagnosis not present

## 2016-02-05 DIAGNOSIS — Z853 Personal history of malignant neoplasm of breast: Secondary | ICD-10-CM | POA: Diagnosis not present

## 2016-02-05 DIAGNOSIS — Z8701 Personal history of pneumonia (recurrent): Secondary | ICD-10-CM | POA: Diagnosis not present

## 2016-02-05 DIAGNOSIS — Z7982 Long term (current) use of aspirin: Secondary | ICD-10-CM | POA: Diagnosis not present

## 2016-02-05 DIAGNOSIS — M1991 Primary osteoarthritis, unspecified site: Secondary | ICD-10-CM | POA: Diagnosis not present

## 2016-02-12 DIAGNOSIS — Z853 Personal history of malignant neoplasm of breast: Secondary | ICD-10-CM | POA: Diagnosis not present

## 2016-02-12 DIAGNOSIS — Z8701 Personal history of pneumonia (recurrent): Secondary | ICD-10-CM | POA: Diagnosis not present

## 2016-02-12 DIAGNOSIS — M1991 Primary osteoarthritis, unspecified site: Secondary | ICD-10-CM | POA: Diagnosis not present

## 2016-02-12 DIAGNOSIS — Z96642 Presence of left artificial hip joint: Secondary | ICD-10-CM | POA: Diagnosis not present

## 2016-02-12 DIAGNOSIS — Z9181 History of falling: Secondary | ICD-10-CM | POA: Diagnosis not present

## 2016-02-12 DIAGNOSIS — Z7982 Long term (current) use of aspirin: Secondary | ICD-10-CM | POA: Diagnosis not present

## 2016-02-12 DIAGNOSIS — Z471 Aftercare following joint replacement surgery: Secondary | ICD-10-CM | POA: Diagnosis not present

## 2016-02-14 DIAGNOSIS — M1612 Unilateral primary osteoarthritis, left hip: Secondary | ICD-10-CM | POA: Diagnosis not present

## 2016-02-19 DIAGNOSIS — M25552 Pain in left hip: Secondary | ICD-10-CM | POA: Diagnosis not present

## 2016-02-19 DIAGNOSIS — R262 Difficulty in walking, not elsewhere classified: Secondary | ICD-10-CM | POA: Diagnosis not present

## 2016-02-19 DIAGNOSIS — Z96642 Presence of left artificial hip joint: Secondary | ICD-10-CM | POA: Diagnosis not present

## 2016-02-19 DIAGNOSIS — M25652 Stiffness of left hip, not elsewhere classified: Secondary | ICD-10-CM | POA: Diagnosis not present

## 2016-02-22 DIAGNOSIS — M25652 Stiffness of left hip, not elsewhere classified: Secondary | ICD-10-CM | POA: Diagnosis not present

## 2016-02-22 DIAGNOSIS — M25552 Pain in left hip: Secondary | ICD-10-CM | POA: Diagnosis not present

## 2016-02-22 DIAGNOSIS — R262 Difficulty in walking, not elsewhere classified: Secondary | ICD-10-CM | POA: Diagnosis not present

## 2016-02-22 DIAGNOSIS — Z96642 Presence of left artificial hip joint: Secondary | ICD-10-CM | POA: Diagnosis not present

## 2016-03-05 DIAGNOSIS — Z23 Encounter for immunization: Secondary | ICD-10-CM | POA: Diagnosis not present

## 2016-03-11 DIAGNOSIS — Z1389 Encounter for screening for other disorder: Secondary | ICD-10-CM | POA: Diagnosis not present

## 2016-03-11 DIAGNOSIS — Z6826 Body mass index (BMI) 26.0-26.9, adult: Secondary | ICD-10-CM | POA: Diagnosis not present

## 2016-03-11 DIAGNOSIS — M8580 Other specified disorders of bone density and structure, unspecified site: Secondary | ICD-10-CM | POA: Diagnosis not present

## 2016-03-11 DIAGNOSIS — E663 Overweight: Secondary | ICD-10-CM | POA: Diagnosis not present

## 2016-03-11 DIAGNOSIS — Z0000 Encounter for general adult medical examination without abnormal findings: Secondary | ICD-10-CM | POA: Diagnosis not present

## 2016-03-11 DIAGNOSIS — Z1231 Encounter for screening mammogram for malignant neoplasm of breast: Secondary | ICD-10-CM | POA: Diagnosis not present

## 2016-03-11 DIAGNOSIS — E782 Mixed hyperlipidemia: Secondary | ICD-10-CM | POA: Diagnosis not present

## 2016-03-11 DIAGNOSIS — Z79899 Other long term (current) drug therapy: Secondary | ICD-10-CM | POA: Diagnosis not present

## 2016-03-13 DIAGNOSIS — M1612 Unilateral primary osteoarthritis, left hip: Secondary | ICD-10-CM | POA: Diagnosis not present

## 2016-05-03 ENCOUNTER — Other Ambulatory Visit: Payer: Self-pay | Admitting: Physician Assistant

## 2016-05-03 DIAGNOSIS — Z853 Personal history of malignant neoplasm of breast: Secondary | ICD-10-CM

## 2016-05-23 ENCOUNTER — Ambulatory Visit
Admission: RE | Admit: 2016-05-23 | Discharge: 2016-05-23 | Disposition: A | Discharge status: Home / Self Care | Payer: PPO | Source: Ambulatory Visit | Attending: Physician Assistant | Admitting: Physician Assistant

## 2016-05-23 ENCOUNTER — Other Ambulatory Visit: Payer: Self-pay | Admitting: Physician Assistant

## 2016-05-23 DIAGNOSIS — N6310 Unspecified lump in the right breast, unspecified quadrant: Secondary | ICD-10-CM | POA: Diagnosis not present

## 2016-05-23 DIAGNOSIS — Z853 Personal history of malignant neoplasm of breast: Secondary | ICD-10-CM

## 2016-09-24 DIAGNOSIS — Z79899 Other long term (current) drug therapy: Secondary | ICD-10-CM | POA: Diagnosis not present

## 2016-09-24 DIAGNOSIS — E782 Mixed hyperlipidemia: Secondary | ICD-10-CM | POA: Diagnosis not present

## 2016-09-24 DIAGNOSIS — Z6825 Body mass index (BMI) 25.0-25.9, adult: Secondary | ICD-10-CM | POA: Diagnosis not present

## 2016-11-20 DIAGNOSIS — L820 Inflamed seborrheic keratosis: Secondary | ICD-10-CM | POA: Diagnosis not present

## 2016-11-20 DIAGNOSIS — Z85828 Personal history of other malignant neoplasm of skin: Secondary | ICD-10-CM | POA: Diagnosis not present

## 2016-11-20 DIAGNOSIS — D2362 Other benign neoplasm of skin of left upper limb, including shoulder: Secondary | ICD-10-CM | POA: Diagnosis not present

## 2017-03-31 DIAGNOSIS — N764 Abscess of vulva: Secondary | ICD-10-CM | POA: Diagnosis not present

## 2017-03-31 DIAGNOSIS — Z1231 Encounter for screening mammogram for malignant neoplasm of breast: Secondary | ICD-10-CM | POA: Diagnosis not present

## 2017-03-31 DIAGNOSIS — Z7189 Other specified counseling: Secondary | ICD-10-CM | POA: Diagnosis not present

## 2017-03-31 DIAGNOSIS — Z79899 Other long term (current) drug therapy: Secondary | ICD-10-CM | POA: Diagnosis not present

## 2017-03-31 DIAGNOSIS — E782 Mixed hyperlipidemia: Secondary | ICD-10-CM | POA: Diagnosis not present

## 2017-03-31 DIAGNOSIS — Z131 Encounter for screening for diabetes mellitus: Secondary | ICD-10-CM | POA: Diagnosis not present

## 2017-03-31 DIAGNOSIS — Z6826 Body mass index (BMI) 26.0-26.9, adult: Secondary | ICD-10-CM | POA: Diagnosis not present

## 2017-03-31 DIAGNOSIS — Z0000 Encounter for general adult medical examination without abnormal findings: Secondary | ICD-10-CM | POA: Diagnosis not present

## 2017-03-31 DIAGNOSIS — Z9181 History of falling: Secondary | ICD-10-CM | POA: Diagnosis not present

## 2017-04-08 ENCOUNTER — Other Ambulatory Visit: Payer: Self-pay | Admitting: Family Medicine

## 2017-04-08 DIAGNOSIS — Z1231 Encounter for screening mammogram for malignant neoplasm of breast: Secondary | ICD-10-CM

## 2017-05-05 DIAGNOSIS — Z1331 Encounter for screening for depression: Secondary | ICD-10-CM | POA: Diagnosis not present

## 2017-05-05 DIAGNOSIS — Z1211 Encounter for screening for malignant neoplasm of colon: Secondary | ICD-10-CM | POA: Diagnosis not present

## 2017-05-05 DIAGNOSIS — Z01419 Encounter for gynecological examination (general) (routine) without abnormal findings: Secondary | ICD-10-CM | POA: Diagnosis not present

## 2017-05-05 DIAGNOSIS — M8580 Other specified disorders of bone density and structure, unspecified site: Secondary | ICD-10-CM | POA: Diagnosis not present

## 2017-05-05 DIAGNOSIS — Z6826 Body mass index (BMI) 26.0-26.9, adult: Secondary | ICD-10-CM | POA: Diagnosis not present

## 2017-05-05 DIAGNOSIS — Z124 Encounter for screening for malignant neoplasm of cervix: Secondary | ICD-10-CM | POA: Diagnosis not present

## 2017-05-06 LAB — HM PAP SMEAR: HM Pap smear: NORMAL

## 2017-05-08 DIAGNOSIS — Z1212 Encounter for screening for malignant neoplasm of rectum: Secondary | ICD-10-CM | POA: Diagnosis not present

## 2017-05-13 DIAGNOSIS — Z1212 Encounter for screening for malignant neoplasm of rectum: Secondary | ICD-10-CM | POA: Diagnosis not present

## 2017-05-13 DIAGNOSIS — Z1211 Encounter for screening for malignant neoplasm of colon: Secondary | ICD-10-CM | POA: Diagnosis not present

## 2017-05-23 LAB — COLOGUARD: Cologuard: NEGATIVE

## 2017-05-26 ENCOUNTER — Ambulatory Visit
Admission: RE | Admit: 2017-05-26 | Discharge: 2017-05-26 | Disposition: A | Discharge status: Home / Self Care | Payer: PPO | Source: Ambulatory Visit | Attending: Family Medicine | Admitting: Family Medicine

## 2017-05-26 DIAGNOSIS — Z1231 Encounter for screening mammogram for malignant neoplasm of breast: Secondary | ICD-10-CM | POA: Diagnosis not present

## 2017-12-10 DIAGNOSIS — C44311 Basal cell carcinoma of skin of nose: Secondary | ICD-10-CM | POA: Diagnosis not present

## 2017-12-10 DIAGNOSIS — L821 Other seborrheic keratosis: Secondary | ICD-10-CM | POA: Diagnosis not present

## 2017-12-10 DIAGNOSIS — Z1283 Encounter for screening for malignant neoplasm of skin: Secondary | ICD-10-CM | POA: Diagnosis not present

## 2017-12-10 DIAGNOSIS — D225 Melanocytic nevi of trunk: Secondary | ICD-10-CM | POA: Diagnosis not present

## 2017-12-10 DIAGNOSIS — Z85828 Personal history of other malignant neoplasm of skin: Secondary | ICD-10-CM | POA: Diagnosis not present

## 2017-12-10 DIAGNOSIS — L812 Freckles: Secondary | ICD-10-CM | POA: Diagnosis not present

## 2017-12-10 DIAGNOSIS — D485 Neoplasm of uncertain behavior of skin: Secondary | ICD-10-CM | POA: Diagnosis not present

## 2017-12-10 DIAGNOSIS — C44519 Basal cell carcinoma of skin of other part of trunk: Secondary | ICD-10-CM | POA: Diagnosis not present

## 2017-12-10 DIAGNOSIS — C4491 Basal cell carcinoma of skin, unspecified: Secondary | ICD-10-CM

## 2017-12-10 HISTORY — DX: Basal cell carcinoma of skin, unspecified: C44.91

## 2017-12-30 DIAGNOSIS — C44519 Basal cell carcinoma of skin of other part of trunk: Secondary | ICD-10-CM | POA: Diagnosis not present

## 2018-01-01 ENCOUNTER — Ambulatory Visit (INDEPENDENT_AMBULATORY_CARE_PROVIDER_SITE_OTHER): Payer: PPO | Admitting: Internal Medicine

## 2018-01-01 ENCOUNTER — Encounter: Payer: Self-pay | Admitting: Internal Medicine

## 2018-01-01 VITALS — BP 130/74 | HR 70 | Temp 98.6°F | Ht 61.0 in | Wt 140.6 lb

## 2018-01-01 DIAGNOSIS — Z1231 Encounter for screening mammogram for malignant neoplasm of breast: Secondary | ICD-10-CM

## 2018-01-01 DIAGNOSIS — Z85828 Personal history of other malignant neoplasm of skin: Secondary | ICD-10-CM | POA: Insufficient documentation

## 2018-01-01 DIAGNOSIS — R009 Unspecified abnormalities of heart beat: Secondary | ICD-10-CM | POA: Diagnosis not present

## 2018-01-01 DIAGNOSIS — R002 Palpitations: Secondary | ICD-10-CM | POA: Diagnosis not present

## 2018-01-01 DIAGNOSIS — E785 Hyperlipidemia, unspecified: Secondary | ICD-10-CM | POA: Insufficient documentation

## 2018-01-01 DIAGNOSIS — R011 Cardiac murmur, unspecified: Secondary | ICD-10-CM

## 2018-01-01 NOTE — Patient Instructions (Addendum)
Tdap vaccine  Every 10 years   Before end Feb 2020 you need 2nd shingrix/shingles vaccine    Dr. Rockey Situ cardiology/heart doctor ([facility])  [place]  [phone]   Echo at [facility] for heart murmur   Referred to [facility] 05/26/2018  Mammogram   Vitamin D3 2000 IU daily  Calcium 600 mg 2x per day=1200 total     Heart Murmur A heart murmur is an extra sound that is caused by chaotic blood flow. The murmur can be heard as a "hum" or "whoosh" sound when blood flows through the heart. The heart has four areas called chambers. Valves separate the upper and lower chambers from each other (tricuspid valve and mitral valve) and separate the lower chambers of the heart from pathways that lead away from the heart (aortic valve and pulmonary valve). Normally, the valves open to let blood flow through or out of your heart, and then they shut to keep the blood from flowing backward. There are two types of heart murmurs:  Innocent murmurs. Most people with this type of heart murmur do not have a heart problem. Many children have innocent heart murmurs. Your health care provider may suggest some basic testing to find out whether your murmur is an innocent murmur. If an innocent heart murmur is found, there is no need for further tests or treatment and no need to restrict activities or stop playing sports.  Abnormal murmurs. These types of murmurs can occur in children and adults. Abnormal murmurs may be a sign of a more serious heart condition, such as a heart defect present at birth (congenital defect) or heart valve disease.  What are the causes? This condition is caused by heart valves that are not working properly. In children, abnormal heart murmurs are typically caused by congenital defects. In adults, abnormal murmurs are usually from heart valve problems caused by disease, infection, or aging. Three types of heart valve defects can  cause a murmur:  Regurgitation. This is when blood leaks back through the valve in the wrong direction.  Mitral valve prolapse. This is when the mitral valve of the heart has a loose flap and does not close tightly.  Stenosis. This is when a valve does not open enough and blocks blood flow.  This condition may also be caused by:  Pregnancy.  Fever.  Overactive thyroid gland.  Anemia.  Exercise.  Rapid growth spurts (in children).  What are the signs or symptoms? Innocent murmurs do not cause symptoms, and many people with abnormal murmurs may or may not have symptoms. If symptoms do develop, they may include:  Shortness of breath.  Blue coloring of the skin, especially on the fingertips.  Chest pain.  Palpitations, or feeling a fluttering or skipped heartbeat.  Fainting.  Persistent cough.  Getting tired much faster than expected.  Swelling in the abdomen, feet, or ankles.  How is this diagnosed? This condition may be diagnosed during a routine physical or other exam. If your health care provider hears a murmur with a stethoscope, he or she will listen for:  Where the murmur is located in your heart.  How long the murmur lasts (duration).  When the murmur is heard during the heartbeat.  How loud the murmur is. This may help the health care provider figure out what is causing the murmur.  You may be referred to a heart specialist (cardiologist). You may also have other tests, including:  Electrocardiogram (  ECG or EKG). This test measures the electrical activity of your heart.  Echocardiogram. This test uses high frequency sound waves to make pictures of your heart.  MRI or chest X-ray.  Cardiac catheterization. This test looks at blood flow through the heart.  For children and adults who have an abnormal heart murmur and want to stay active, it is important to complete testing, review test results, and receive recommendations from your health care  provider. If heart disease is present, it may not be safe to play or be active. How is this treated? Heart murmurs themselves do not need treatment. In some cases, a heart murmur may go away on its own. If an underlying problem or disease is causing the murmur, you may need treatment. If treatment is needed, it will depend on the type and severity of the disease or heart problem causing the murmur. Treatment may include:  Medicine.  Surgery.  Dietary and lifestyle changes.  Follow these instructions at home:  Talk with your health care provider before participating in sports or other activities that require a lot of effort and energy (are strenuous).  Learn as much as possible about your condition and any related diseases. Ask your health care provider if you may at risk for any medical emergencies.  Talk with your health care provider about what symptoms you should look out for.  It is up to you to get your test results. Ask your health care provider, or the department that is doing the test, when your results will be ready.  Keep all follow-up visits as told by your health care provider. This is important. Contact a health care provider if:  You feel light-headed.  You are frequently short of breath.  You feel more tired than usual.  You are having a hard time keeping up with normal activities or fitness routines.  You have swelling in your ankles or feet.  You have chest pain.  You notice that your heart often beats irregularly.  You develop any new symptoms. Get help right away if:  You develop severe chest pain.  You are having trouble breathing.  You have fainting spells.  Your symptoms suddenly get worse. These symptoms may represent a serious problem that is an emergency. Do not wait to see if the symptoms will go away. Get medical help right away. Call your local emergency services (911 in the [place] Do not drive yourself to the hospital. Summary  Normally,  the heart valves open to let blood flow through or out of your heart, and then they shut to keep the blood from flowing backward.  Heart murmur is caused by heart valves that are not working properly.  You may need treatment if an underlying problem or disease is causing the heart murmur. Treatment may include medicine, surgery, or dietary and lifestyle changes.  Talk with your health care provider before participating in sports or other activities that require a lot of effort and energy (are strenuous).  Talk with your health care provider about what symptoms you should watch out for. This information is not intended to replace advice given to you by your health care provider. Make sure you discuss any questions you have with your health care provider. Document Released: 05/23/2004 Document Revised: 04/03/2016 Document Reviewed: 04/03/2016 Elsevier Interactive Patient Education  2018 [employer].  Palpitations A palpitation is the feeling that your heartbeat is irregular or is faster than normal. It may feel like your heart is fluttering or skipping a  beat. Palpitations are usually not a serious problem. They may be caused by many things, including smoking, caffeine, alcohol, stress, and certain medicines. Although most causes of palpitations are not serious, palpitations can be a sign of a serious medical problem. In some cases, you may need further medical evaluation. Follow these instructions at home: Pay attention to any changes in your symptoms. Take these actions to help with your condition:  Avoid the following: ? Caffeinated coffee, tea, soft drinks, diet pills, and energy drinks. ? Chocolate. ? Alcohol.  Do not use any tobacco products, such as cigarettes, chewing tobacco, and e-cigarettes. If you need help quitting, ask your health care provider.  Try to reduce your stress and anxiety. Things that can help you relax include: ? Yoga. ? Meditation. ? Physical activity, such as  swimming, jogging, or walking. ? Biofeedback. This is a method that helps you learn to use your mind to control things in your body, such as your heartbeats.  Get plenty of rest and sleep.  Take over-the-counter and prescription medicines only as told by your health care provider.  Keep all follow-up visits as told by your health care provider. This is important.  Contact a health care provider if:  You continue to have a fast or irregular heartbeat after 24 hours.  Your palpitations occur more often. Get help right away if:  You have chest pain or shortness of breath.  You have a severe headache.  You feel dizzy or you faint. This information is not intended to replace advice given to you by your health care provider. Make sure you discuss any questions you have with your health care provider. Document Released: 04/12/2000 Document Revised: 09/18/2015 Document Reviewed: 12/29/2014 Elsevier Interactive Patient Education  2018 [employer].  DTaP Vaccine (Diphtheria, Tetanus, and Pertussis): What You Need to Know 1. Why get vaccinated? Diphtheria, tetanus, and pertussis are serious diseases caused by bacteria. Diphtheria and pertussis are spread from person to person. Tetanus enters the body through cuts or wounds. DIPHTHERIA causes a thick covering in the back of the throat.  It can lead to breathing problems, paralysis, heart failure, and even death.  TETANUS (Lockjaw) causes painful tightening of the muscles, usually all over the body.  It can lead to "locking" of the jaw so the victim cannot open his mouth or swallow. Tetanus leads to death in up to 2 out of 10 cases.  PERTUSSIS (Whooping Cough) causes coughing spells so bad that it is hard for infants to eat, drink, or breathe. These spells can last for weeks.  It can lead to pneumonia, seizures (jerking and staring spells), brain damage, and death.  Diphtheria, tetanus, and pertussis vaccine (DTaP) can help prevent these  diseases. Most children who are vaccinated with DTaP will be protected throughout childhood. Many more children would get these diseases if we stopped vaccinating. DTaP is a safer version of an older vaccine called DTP. DTP is no longer used in the [place]. 2. Who should get DTaP vaccine and when? Children should get 5 doses of DTaP vaccine, one dose at each of the following ages:  2 months  4 months  6 months  15-18 months  4-6 years  DTaP may be given at the same time as other vaccines. 3. Some children should not get DTaP vaccine or should wait  Children with minor illnesses, such as a cold, may be vaccinated. But children who are moderately or severely ill should usually wait until they recover before getting  DTaP vaccine.  Any child who had a life-threatening allergic reaction after a dose of DTaP should not get another dose.  Any child who suffered a brain or nervous system disease within 7 days after a dose of DTaP should not get another dose.  Talk with your doctor if your child: ? had a seizure or collapsed after a dose of DTaP, ? cried non-stop for 3 hours or more after a dose of DTaP, ? had a fever over 105F after a dose of DTaP. Ask your doctor for more information. Some of these children should not get another dose of pertussis vaccine, but may get a vaccine without pertussis, called DT. 4. Older children and adults DTaP is not licensed for adolescents, adults, or children 54 years of age and older. But older people still need protection. A vaccine called Tdap is similar to DTaP. A single dose of Tdap is recommended for people 11 through 70 years of age. Another vaccine, called Td, protects against tetanus and diphtheria, but not pertussis. It is recommended every 10 years. There are separate Vaccine Information Statements for these vaccines. 5. What are the risks from DTaP vaccine? Getting diphtheria, tetanus, or pertussis disease is much riskier than getting DTaP  vaccine. However, a vaccine, like any medicine, is capable of causing serious problems, such as severe allergic reactions. The risk of DTaP vaccine causing serious harm, or death, is extremely small. Mild problems (common)  Fever (up to about 1 child in 4)  Redness or swelling where the shot was given (up to about 1 child in 4)  Soreness or tenderness where the shot was given (up to about 1 child in 4) These problems occur more often after the 4th and 5th doses of the DTaP series than after earlier doses. Sometimes the 4th or 5th dose of DTaP vaccine is followed by swelling of the entire arm or leg in which the shot was given, lasting 1-7 days (up to about 1 child in 8). Other mild problems include:  Fussiness (up to about 1 child in 3)  Tiredness or poor appetite (up to about 1 child in 10)  Vomiting (up to about 1 child in 57) These problems generally occur 1-3 days after the shot. Moderate problems (uncommon)  Seizure (jerking or staring) (about 1 child out of 14,000)  Non-stop crying, for 3 hours or more (up to about 1 child out of 1,000)  High fever, over 105F (about 1 child out of 16,000) Severe problems (very rare)  Serious allergic reaction (less than 1 out of a million doses)  Several other severe problems have been reported after DTaP vaccine. These include: ? Long-term seizures, coma, or lowered consciousness ? Permanent brain damage. These are so rare it is hard to tell if they are caused by the vaccine. Controlling fever is especially important for children who have had seizures, for any reason. It is also important if another family member has had seizures. You can reduce fever and pain by giving your child an aspirin-free pain reliever when the shot is given, and for the next 24 hours, following the package instructions. 6. What if there is a serious reaction? What should I look for? Look for anything that concerns you, such as signs of a severe allergic reaction,  very high fever, or behavior changes. Signs of a severe allergic reaction can include hives, swelling of the face and throat, difficulty breathing, a fast heartbeat, dizziness, and weakness. These would start a few minutes to a  few hours after the vaccination. What should I do?  If you think it is a severe allergic reaction or other emergency that can't wait, call 9-1-1 or get the person to the nearest hospital. Otherwise, call your doctor.  Afterward, the reaction should be reported to the Vaccine Adverse Event Reporting System (VAERS). Your doctor might file this report, or you can do it yourself through the VAERS web site at www.vaers.SamedayNews.es, or by calling 561-250-8055. ? VAERS is only for reporting reactions. They do not give medical advice. 7. The National Vaccine Injury Compensation Program The [employer] Vaccine Injury Compensation Program (VICP) is a federal program that was created to compensate people who may have been injured by certain vaccines. Persons who believe they may have been injured by a vaccine can learn about the program and about filing a claim by calling (828) 680-9078 or visiting the [place] website at GoldCloset.com.ee. 8. How can I learn more?  Ask your doctor.  Call your local or state health department.  Contact the Centers for Disease Control and Prevention (CDC): ? Call (936) 761-9085 (1-800-CDC-INFO) or ? Visit CDC's website at http://hunter.com/ CDC DTaP Vaccine (Diphtheria, Tetanus, and Pertussis) VIS (09/12/05) This information is not intended to replace advice given to you by your health care provider. Make sure you discuss any questions you have with your health care provider. Document Released: 02/10/2006 Document Revised: 01/04/2016 Document Reviewed: 01/04/2016 Elsevier Interactive Patient Education  2017 [employer].

## 2018-01-01 NOTE — Progress Notes (Addendum)
Chief Complaint  Patient presents with  . Establish Care   New patient former pt [facility]  1. C/o palpitations and heart fluttering intermittently. Denies chest pain  2. S/p right lumpectomy bx 2016 negative malignancy  3. HLD on lipitor 10 mg qhs     ROS Past Medical History:  Diagnosis Date  . Arthritis   . Breast mass    right  . Cancer ([place])    skin cancer SCC, BCC multiple   . Headache   . History of bronchitis   . History of pneumonia   . Hyperlipidemia    Past Surgical History:  Procedure Laterality Date  . BREAST LUMPECTOMY     right 2016 negative malignancy fibrocystic changes   . COLONOSCOPY    . DILATION AND CURETTAGE OF UTERUS    . JOINT REPLACEMENT     left   . MOHS SURGERY     x2 face   . RADIOACTIVE SEED GUIDED EXCISIONAL BREAST BIOPSY Right 04/06/2015   Procedure: RADIOACTIVE SEED GUIDED EXCISIONAL RIGHT BREAST BIOPSY;  Surgeon: Stark Klein, MD;  Location: [place];  Service: General;  Laterality: Right;  . 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   Procedure: TOTAL HIP ARTHROPLASTY ANTERIOR APPROACH;  Surgeon: Renette Butters, MD;  Location: [place];  Service: Orthopedics;  Laterality: Left;  . WISDOM TOOTH EXTRACTION     Family History  Problem Relation Age of Onset  . Stroke Mother   . Alzheimer's disease Father    Social History   Socioeconomic History  . Marital status: Married    Spouse name: Not on file  . Number of children: Not on file  . Years of education: Not on file  . Highest education level: Not on file  Occupational History  . Not on file  Social Needs  . Financial resource strain: Not on file  . Food insecurity:    Worry: Not on file    Inability: Not on file  . Transportation needs:    Medical: Not on file    Non-medical: Not on file  Tobacco Use  . Smoking status: Never Smoker  . Smokeless tobacco: Never Used  Substance and Sexual Activity  . Alcohol use: Yes    Comment: social  . Drug use: No  . Sexual  activity: Not Currently  Lifestyle  . Physical activity:    Days per week: Not on file    Minutes per session: Not on file  . Stress: Not on file  Relationships  . Social connections:    Talks on phone: Not on file    Gets together: Not on file    Attends religious service: Not on file    Active member of club or organization: Not on file    Attends meetings of clubs or organizations: Not on file    Relationship status: Not on file  . Intimate partner violence:    Fear of current or ex partner: Not on file    Emotionally abused: Not on file    Physically abused: Not on file    Forced sexual activity: Not on file  Other Topics Concern  . 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 atorvastatin (LIPITOR) 10 MG tablet Take 10 mg by mouth daily.  Marland Kitchen  Biotin 10 MG CAPS Take 1,000 mg by mouth daily.   . calcium carbonate (OS-CAL) 1250 (500 CA) MG chewable tablet Chew 1 tablet by mouth daily.  . cholecalciferol (VITAMIN D) 1000 UNITS tablet Take 1,000 Units by mouth daily.  Marland Kitchen glucosamine-chondroitin 500-400 MG tablet Take 2 tablets by mouth daily.  Javier Docker Oil 1000 MG CAPS Take 3 capsules by mouth daily.   . Multiple Vitamin (MULTIVITAMIN WITH MINERALS) TABS tablet Take 1 tablet by mouth daily.  . vitamin C (ASCORBIC ACID) 250 MG tablet Take 250 mg by mouth daily.  . [DISCONTINUED] acetaminophen (TYLENOL) 650 MG CR tablet Take 1,300 mg by mouth every 8 (eight) hours as needed for pain.  . [DISCONTINUED] aspirin EC 325 MG tablet Take 1 tablet (325 mg total) by mouth daily.  . [DISCONTINUED] diclofenac (VOLTAREN) 50 MG EC tablet Take 50 mg by mouth 2 (two) times daily.  . [DISCONTINUED] diphenhydramine-acetaminophen (TYLENOL PM) 25-500 MG TABS tablet Take 1 tablet by mouth at bedtime as needed (sleep).  . [DISCONTINUED] docusate sodium (COLACE)  100 MG capsule Take 1 capsule (100 mg total) by mouth 2 (two) times daily. To prevent constipation while taking pain medication.  . [DISCONTINUED] methocarbamol (ROBAXIN) 500 MG tablet Take 1 tablet (500 mg total) by mouth every 6 (six) hours as needed for muscle spasms.  . [DISCONTINUED] omeprazole (PRILOSEC) 20 MG capsule Take 1 capsule (20 mg total) by mouth daily. While taking anti inflammatory medicine daily  . [DISCONTINUED] ondansetron (ZOFRAN) 4 MG tablet Take 1 tablet (4 mg total) by mouth every 8 (eight) hours as needed for nausea or vomiting.  . [DISCONTINUED] oxyCODONE-acetaminophen (ROXICET) 5-325 MG tablet Take 1-2 tablets by mouth every 4 (four) hours as needed for severe pain.   Allergies  Allergen Reactions  . Aspartame And Phenylalanine   . Meloxicam Hives and Diarrhea  . Other Itching    Sunscreens Lotions and Tanning products   No results found for this or any previous visit (from the past 2160 hour(s)). Objective  Body mass index is 26.57 kg/m. Wt Readings from Last 3 Encounters:  01/01/18 140 lb 9.6 oz (63.8 kg)  01/30/16 140 lb (63.5 kg)  01/18/16 140 lb 8 oz (63.7 kg)   Temp Readings from Last 3 Encounters:  01/01/18 98.6 F (37 C) (Oral)  01/31/16 98.2 F (36.8 C) (Oral)  01/18/16 98.6 F (37 C) (Oral)   BP Readings from Last 3 Encounters:  01/01/18 130/74  01/31/16 (!) 101/55  01/18/16 130/83   Pulse Readings from Last 3 Encounters:  01/01/18 70  01/31/16 97  01/18/16 74    Physical Exam  Constitutional: She is oriented to person, place, and time. Vital signs are normal. She appears well-developed and well-nourished. She is cooperative.  HENT:  Head: Normocephalic and atraumatic.  Mouth/Throat: Oropharynx is clear and moist and mucous membranes are normal.    Eyes: Pupils are equal, round, and reactive to light. Conjunctivae are normal.  Cardiovascular: Normal rate and regular rhythm.  Murmur heard. Pulmonary/Chest: Effort normal and breath  sounds normal.    Neurological: She is alert and oriented to person, place, and time. Gait normal.  Skin: Skin is warm, dry and intact.     Psychiatric: She has a normal mood and affect. Her speech is normal and behavior is normal. Judgment and thought content normal. Cognition and memory are normal.  Nursing note and vitals reviewed.   Assessment   1. Cardiac murmur, palpitations  2. S/p right lumpectomy  3. HLD  4. HM  Plan   1. Refer echo Refer cardiology Dr. Rockey Situ holter  2. Refer mammo screening [facility] due 05/26/2018  3. Review prior PCP labs [facility] medical  4.  Had flu shot 12/26/17 shingrix 1/2 12/26/17  rec Tdap  Check on dates prevnar and pna 70   Out of age window pap  Referred mammo due 05/26/2018  cologuard per pt down get records  dexa get records [facility] possibly osteopenia  H/o skin SCC, BCC multiple f/u Dr. Laurence Ferrari pending mohs 3rd to nose 01/2018 Dr. Elvera Bicker [facility]   [facility] left alar nose mohs on 02/04/18 Dr. Manley Mason Provider: Dr. Olivia Mackie McLean-Scocuzza-Internal Medicine

## 2018-01-01 NOTE — Progress Notes (Signed)
Pre visit review using our clinic review tool, if applicable. No additional management support is needed unless otherwise documented below in the visit note. 

## 2018-01-09 ENCOUNTER — Ambulatory Visit: Payer: PPO

## 2018-01-12 ENCOUNTER — Ambulatory Visit
Admission: RE | Admit: 2018-01-12 | Discharge: 2018-01-12 | Disposition: A | Discharge status: Home / Self Care | Payer: PPO | Source: Ambulatory Visit | Attending: Internal Medicine | Admitting: Internal Medicine

## 2018-01-12 DIAGNOSIS — Z8701 Personal history of pneumonia (recurrent): Secondary | ICD-10-CM | POA: Insufficient documentation

## 2018-01-12 DIAGNOSIS — R011 Cardiac murmur, unspecified: Secondary | ICD-10-CM | POA: Insufficient documentation

## 2018-01-12 DIAGNOSIS — E785 Hyperlipidemia, unspecified: Secondary | ICD-10-CM | POA: Insufficient documentation

## 2018-01-12 DIAGNOSIS — N63 Unspecified lump in unspecified breast: Secondary | ICD-10-CM | POA: Insufficient documentation

## 2018-01-12 DIAGNOSIS — R51 Headache: Secondary | ICD-10-CM | POA: Diagnosis not present

## 2018-01-12 NOTE — Progress Notes (Signed)
*  PRELIMINARY RESULTS* Echocardiogram 2D Echocardiogram has been performed.  Sherrie Sport 01/12/2018, 12:00 PM

## 2018-02-04 DIAGNOSIS — L578 Other skin changes due to chronic exposure to nonionizing radiation: Secondary | ICD-10-CM | POA: Diagnosis not present

## 2018-02-04 DIAGNOSIS — C44311 Basal cell carcinoma of skin of nose: Secondary | ICD-10-CM | POA: Diagnosis not present

## 2018-02-04 DIAGNOSIS — L988 Other specified disorders of the skin and subcutaneous tissue: Secondary | ICD-10-CM | POA: Diagnosis not present

## 2018-02-04 DIAGNOSIS — L814 Other melanin hyperpigmentation: Secondary | ICD-10-CM | POA: Diagnosis not present

## 2018-02-05 ENCOUNTER — Encounter: Payer: Self-pay | Admitting: Internal Medicine

## 2018-03-08 DIAGNOSIS — R002 Palpitations: Secondary | ICD-10-CM | POA: Insufficient documentation

## 2018-03-08 DIAGNOSIS — R011 Cardiac murmur, unspecified: Secondary | ICD-10-CM | POA: Insufficient documentation

## 2018-03-08 NOTE — Progress Notes (Signed)
Cardiology Office Note  Date:  03/09/2018   ID:  SHEVONNE WOLF, DOB 1945/07/13, MRN [id number]  PCP:  McLean-Scocuzza, Nino Glow, MD   Chief Complaint  Patient presents with  . other    Ref by Dr.McLean for palpitations, heart murmur, and discuss Echo results. Meds reviewed by the pt. verbally. Pt. c/o fluttering in chest at times.      HPI:  Ms. Andrea Spencer is a 72 year old woman with past medical history of Hyperlipidemia, been on lipitor since age 73 No smoking No diabetes Referred by Dr. Orland Mustard for consultation of her heart fluttering/palpitations, murmur   Recent echocardiogram showed normal LV function, no significant valve disease, normal right heart pressures Details were discussed with her  She is active but no regular exercise program Denies any significant chest pain on her daily activities Even on heavy exertion denies any discomfort or shortness of breath  She does occasionally have a fullness in her chest when she is stressed or anxious Occasional fluttering in the situations  Denies any palpitations or tachycardia on a day-to-day basis apart from stress or anxiety  EKG personally reviewed by myself on todays visit Shows normal sinus rhythm rate 70 bpm no significant ST or T wave changes    PMH:   has a past medical history of Arthritis, Breast mass, Cancer ([place]), Headache, History of bronchitis, History of pneumonia, and Hyperlipidemia.  PSH:    Past Surgical History:  Procedure Laterality Date  . BREAST LUMPECTOMY     right 2016 negative malignancy fibrocystic changes   . COLONOSCOPY    . DILATION AND CURETTAGE OF UTERUS    . JOINT REPLACEMENT     left   . MOHS SURGERY     x2 face   . MOHS SURGERY     L alar 02/04/18   . RADIOACTIVE SEED GUIDED EXCISIONAL BREAST BIOPSY Right 04/06/2015   Procedure: RADIOACTIVE SEED GUIDED EXCISIONAL RIGHT BREAST BIOPSY;  Surgeon: Stark Klein, MD;  Location: [place];  Service: General;   Laterality: Right;  . 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   Procedure: TOTAL HIP ARTHROPLASTY ANTERIOR APPROACH;  Surgeon: Renette Butters, MD;  Location: [place];  Service: Orthopedics;  Laterality: Left;  . WISDOM TOOTH EXTRACTION      Current Outpatient Medications  Medication Sig Dispense Refill  . aspirin 81 MG tablet Take 81 mg by mouth daily.    Marland Kitchen atorvastatin (LIPITOR) 10 MG tablet Take 10 mg by mouth daily.    . Biotin 10 MG CAPS Take 1,000 mg by mouth daily.     . Calcium Carb-Cholecalciferol ([employer] CALCIUM 600 & VITAMIN D3 PO) Take by mouth 2 (two) times daily after a meal.    . calcium carbonate (OS-CAL) 1250 (500 CA) MG chewable tablet Chew 1 tablet by mouth daily.    . cholecalciferol (VITAMIN D) 1000 UNITS tablet Take 1,000 Units by mouth daily.    Marland Kitchen glucosamine-chondroitin 500-400 MG tablet Take 2 tablets by mouth daily.    Javier Docker Oil 1000 MG CAPS Take 3 capsules by mouth daily.     . Multiple Vitamin (MULTIVITAMIN WITH MINERALS) TABS tablet Take 1 tablet by mouth daily.    . vitamin C (ASCORBIC ACID) 250 MG tablet Take 250 mg by mouth daily.     No current facility-administered medications for this visit.      Allergies:   Aspartame and phenylalanine; Meloxicam; and Other   Social History:  The patient  reports that she  has never smoked. She has never used smokeless tobacco. She reports that she drinks alcohol. She reports that she does not use drugs.   Family History:   family history includes Alzheimer's disease in her father; Stroke in her mother.    Review of Systems: Review of Systems  Constitutional: Negative.   Respiratory: Negative.   Cardiovascular: Negative.        Rare chest tightness with anxiety or stress, also with occasional fluttering during stress  Gastrointestinal: Negative.   Musculoskeletal: Negative.   Neurological: Negative.   Psychiatric/Behavioral: Negative.   All other systems reviewed and are negative.    PHYSICAL EXAM: VS:  BP  138/72 (BP Location: Right Arm, Patient Position: Sitting, Cuff Size: Normal)   Pulse 66   Ht 5\' 1"  (1.549 m)   Wt 141 lb 8 oz (64.2 kg)   BMI 26.74 kg/m  , BMI Body mass index is 26.74 kg/m. GEN: Well nourished, well developed, in no acute distress  HEENT: normal  Neck: no JVD, carotid bruits, or masses Cardiac: RRR; no murmurs, rubs, or gallops,no edema  Respiratory:  clear to auscultation bilaterally, normal work of breathing GI: soft, nontender, nondistended, + BS MS: no deformity or atrophy  Skin: warm and dry, no rash Neuro:  Strength and sensation are intact Psych: euthymic mood, full affect   Recent Labs: No results found for requested labs within last 8760 hours.    Lipid Panel No results found for: CHOL, HDL, LDLCALC, TRIG    Wt Readings from Last 3 Encounters:  03/09/18 141 lb 8 oz (64.2 kg)  01/01/18 140 lb 9.6 oz (63.8 kg)  01/30/16 140 lb (63.5 kg)       ASSESSMENT AND PLAN:  Palpitations - Plan: EKG 12-Lead Reports having palpitations only during episodes of severe stress Does not feel that his heart arrhythmia only a tightness Declining monitor at this time Suspects no significant arrhythmia, though if symptoms do get worse recommend she call our office for event monitor  Murmur - Plan: EKG 12-Lead No significant murmur appreciated Recent echocardiogram suggesting no significant valvular disease  Chest tightness Some tightness in her chest only with severe stress or anxiety Denies any symptoms with heavy exertion Less likely angina, We have recommended if symptoms get worse she call our office for some kind of testing We did discuss CT coronary calcium scoring or stress testing She has declined at this time Few risk factors that she has been on cholesterol for long period of time, no diabetes, no smoking history  Disposition:   F/U as needed   Total encounter time more than 45 minutes  Greater than 50% was spent in counseling and coordination  of care with the patient    Orders Placed This Encounter  Procedures  . EKG 12-Lead     Signed, Esmond Plants, M.D., Ph.D. 03/09/2018  [place], [place]

## 2018-03-09 ENCOUNTER — Ambulatory Visit (INDEPENDENT_AMBULATORY_CARE_PROVIDER_SITE_OTHER): Payer: PPO | Admitting: Cardiovascular Disease

## 2018-03-09 ENCOUNTER — Encounter: Payer: Self-pay | Admitting: Cardiovascular Disease

## 2018-03-09 VITALS — BP 138/72 | HR 66 | Ht 61.0 in | Wt 141.5 lb

## 2018-03-09 DIAGNOSIS — R002 Palpitations: Secondary | ICD-10-CM | POA: Diagnosis not present

## 2018-03-09 DIAGNOSIS — R011 Cardiac murmur, unspecified: Secondary | ICD-10-CM | POA: Diagnosis not present

## 2018-03-09 DIAGNOSIS — E782 Mixed hyperlipidemia: Secondary | ICD-10-CM

## 2018-03-09 DIAGNOSIS — R0789 Other chest pain: Secondary | ICD-10-CM | POA: Diagnosis not present

## 2018-03-09 NOTE — Patient Instructions (Signed)

## 2018-03-23 ENCOUNTER — Ambulatory Visit (INDEPENDENT_AMBULATORY_CARE_PROVIDER_SITE_OTHER): Payer: PPO

## 2018-03-23 VITALS — BP 120/80 | HR 70 | Temp 98.5°F | Resp 15 | Ht 60.75 in | Wt 140.4 lb

## 2018-03-23 DIAGNOSIS — Z13820 Encounter for screening for osteoporosis: Secondary | ICD-10-CM | POA: Diagnosis not present

## 2018-03-23 DIAGNOSIS — Z0000 Encounter for general adult medical examination without abnormal findings: Secondary | ICD-10-CM | POA: Diagnosis not present

## 2018-03-23 DIAGNOSIS — Z1159 Encounter for screening for other viral diseases: Secondary | ICD-10-CM | POA: Diagnosis not present

## 2018-03-23 NOTE — Patient Instructions (Addendum)
  Andrea Spencer , Thank you for taking time to come for your Medicare Wellness Visit. I appreciate your ongoing commitment to your health goals. Please review the following plan we discussed and let me know if I can assist you in the future.   Follow up as needed.    Bring a copy of your Louise and/or Living Will to be scanned into chart.  Schedule an eye exam.   Have a great day!  These are the goals we discussed: Goals    . Increase physical activity       This is a list of the screening recommended for you and due dates:  Health Maintenance  Topic Date Due  . Tetanus Vaccine  03/02/1967  . Cologuard (Stool DNA test)  03/01/1998  . DEXA scan (bone density measurement)  03/01/2013  . Mammogram  05/27/2019  . Flu Shot  Completed  .  Hepatitis C: One time screening is recommended by Center for Disease Control  (CDC) for  adults born from [birth year] through 1965.   Completed  . Pneumonia vaccines  Completed

## 2018-03-23 NOTE — Progress Notes (Signed)
Subjective:   Andrea Spencer is a 72 y.o. female who presents for an Initial Medicare Annual Wellness Visit.  Review of Systems    No ROS.  Medicare Wellness Visit. Additional risk factors are reflected in the social history.  Cardiac Risk Factors include: advanced age (>63men, >35 women)     Objective:    Today's Vitals   03/23/18 0853  BP: 120/80  Pulse: 70  Resp: 15  Temp: 98.5 F (36.9 C)  TempSrc: Oral  SpO2: 96%  Weight: 140 lb 6.4 oz (63.7 kg)  Height: 5' 0.75" (1.543 m)   Body mass index is 26.75 kg/m.  Advanced Directives 03/23/2018 01/18/2016 04/05/2015 03/17/2015  Does Patient Have a Medical Advance Directive? Yes No No No  Type of Paramedic of [place];Living will - - -  Does patient want to make changes to medical advance directive? No - Patient declined - - -  Copy of [place] in Chart? No - copy requested - - -  Would patient like information on creating a medical advance directive? - No - patient declined information No - patient declined information -    Current Medications (verified) Outpatient Encounter Medications as of 03/23/2018  Medication Sig  . aspirin 81 MG tablet Take 81 mg by mouth daily.  Marland Kitchen atorvastatin (LIPITOR) 10 MG tablet Take 10 mg by mouth daily.  . Biotin 10 MG CAPS Take 1,000 mg by mouth daily.   . Calcium Carb-Cholecalciferol ([facility] CALCIUM 600 & VITAMIN D3 PO) Take by mouth 2 (two) times daily after a meal.  . calcium carbonate (OS-CAL) 1250 (500 CA) MG chewable tablet Chew 1 tablet by mouth daily.  . cholecalciferol (VITAMIN D) 1000 UNITS tablet Take 1,000 Units by mouth daily.  Marland Kitchen glucosamine-chondroitin 500-400 MG tablet Take 2 tablets by mouth daily.  Javier Docker Oil 1000 MG CAPS Take 3 capsules by mouth daily.   . Multiple Vitamin (MULTIVITAMIN WITH MINERALS) TABS tablet Take 1 tablet by mouth daily.  . vitamin C (ASCORBIC ACID) 250 MG tablet Take 250 mg by mouth daily.   No  facility-administered encounter medications on file as of 03/23/2018.     Allergies (verified) Aspartame and phenylalanine; Meloxicam; and Other   History: Past Medical History:  Diagnosis Date  . Arthritis   . Breast mass    right  . Cancer ([place])    skin cancer SCC, BCC multiple   . Headache   . History of bronchitis   . History of pneumonia   . Hyperlipidemia    Past Surgical History:  Procedure Laterality Date  . BREAST LUMPECTOMY     right 2016 negative malignancy fibrocystic changes   . COLONOSCOPY    . DILATION AND CURETTAGE OF UTERUS    . JOINT REPLACEMENT     left   . MOHS SURGERY     x2 face   . MOHS SURGERY     L alar 02/04/18   . RADIOACTIVE SEED GUIDED EXCISIONAL BREAST BIOPSY Right 04/06/2015   Procedure: RADIOACTIVE SEED GUIDED EXCISIONAL RIGHT BREAST BIOPSY;  Surgeon: Stark Klein, MD;  Location: [facility];  Service: General;  Laterality: Right;  . 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   Procedure: TOTAL HIP ARTHROPLASTY ANTERIOR APPROACH;  Surgeon: Renette Butters, MD;  Location: [facility];  Service: Orthopedics;  Laterality: Left;  . WISDOM TOOTH EXTRACTION     Family History  Problem Relation Age of Onset  . Stroke Mother   . Alzheimer's  disease Father    Social History   Socioeconomic History  . Marital status: Married    Spouse name: Not on file  . Number of children: Not on file  . Years of education: Not on file  . Highest education level: Not on file  Occupational History  . Not on file  Social Needs  . Financial resource strain: Not hard at all  . Food insecurity:    Worry: Never true    Inability: Never true  . Transportation needs:    Medical: No    Non-medical: No  Tobacco Use  . Smoking status: Never Smoker  . Smokeless tobacco: Never Used  Substance and Sexual Activity  . Alcohol use: Yes    Comment: social  . Drug use: No  . Sexual activity: Not Currently  Lifestyle  . Physical activity:    Days per week: Not on file     Minutes per session: Not on file  . Stress: Not at all  Relationships  . Social connections:    Talks on phone: Not on file    Gets together: Not on file    Attends religious service: Not on file    Active member of club or organization: Not on file    Attends meetings of clubs or organizations: Not on file    Relationship status: Not on file  Other Topics Concern  . 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 N  Vision? N  Difficulty concentrating or making decisions? N  Walking or climbing stairs? N  Dressing or bathing? N  Doing errands, shopping? N  Preparing Food and eating ? N  Using the Toilet? N  In the past six months, have you accidently leaked urine? N  Do you have problems with loss of bowel control? N  Managing your Medications? N  Managing your Finances? N  Housekeeping or managing your Housekeeping? N  Some recent data might be hidden     Immunizations and Health Maintenance Immunization History  Administered Date(s) Administered  . Influenza, High Dose Seasonal PF 12/26/2017  . Pneumococcal Conjugate-13 06/18/2013  . Pneumococcal Polysaccharide-23 12/26/2017  . Zoster Recombinat (Shingrix) 12/26/2017   Health Maintenance Due  Topic Date Due  . TETANUS/TDAP  03/02/1967  . Fecal DNA (Cologuard)  03/01/1998  .  DEXA SCAN  03/01/2013    Patient Care Team: McLean-Scocuzza, Nino Glow, MD as PCP - General (Internal Medicine)  Indicate any recent Medical Services you may have received from other than [facility] providers in the past year (date may be approximate).     Assessment:   This is a routine wellness examination for [name].  The goal of the wellness visit is to assist the patient how to close the gaps in care  and create a preventative care plan for the patient.   The roster of all physicians providing medical care to patient is listed in the Snapshot section of the chart.  Taking calcium VIT D as appropriate/Osteoporosis risk reviewed.  Dexa scan ordered to be completed in the Spring. Last reported 2017.   Safety issues reviewed; Smoke and carbon monoxide detectors in the home. Firearm safety discussed. Wears seatbelts when driving or riding with others. No violence in the home.  They do not have excessive sun exposure.  Discussed the need for sun protection: hats, long sleeves and the use of sunscreen if there is significant sun exposure.  Patient is alert, normal appearance, oriented to person/place/and time.  Correctly identified the president of the [place] and recalls of 3/3 words. Performs simple calculations and can read correct time from watch face.  Displays appropriate judgement.  No new identified risk were noted.  No failures at ADL's or IADL's.    BMI- discussed the importance of a healthy diet, water intake and the benefits of aerobic exercise. Educational material provided.   24 hour diet recall: Regular diet  Dental- every 6 months.  Eye- schedule annual eye exam. She wears reading glasses only.  Vision 20/30.  Sleep patterns- Sleep without issues.   Dexa scan ordered; follow as directed.  Educational material provided.  Hep c screening consent given.   Colonoscopy discussed. Reports completing [facility] with [facility] 2017.   Patient Concerns: None at this time.  Follow up with PCP as needed.  Hearing/Vision screen  Visual Acuity Screening   Right eye Left eye Both eyes  Without correction:   20/30  With correction:     Hearing Screening Comments: Patient is able to hear conversational tones without difficulty.  No issues reported.   Dietary issues and exercise activities discussed: Current Exercise Habits: Home exercise routine, Type of exercise: walking;stretching, Time (Minutes): 30, Frequency (Times/Week): 5, Weekly Exercise (Minutes/Week): 150, Intensity: Mild  Goals    . Increase physical activity      Depression Screen PHQ 2/9 Scores 03/23/2018 01/01/2018  PHQ - 2 Score 0 0    Fall Risk Fall Risk  03/23/2018 01/01/2018  Falls in the past year? 0 No   Cognitive Function: MMSE - Mini Mental State Exam 03/23/2018  Orientation to time 5  Orientation to Place 5  Registration 3  Attention/ Calculation 5  Recall 3  Language- name 2 objects 2  Language- repeat 1  Language- follow 3 step command 3  Language- read & follow direction 1  Write a sentence 1  Copy design 1  Total score 30        Screening Tests Health Maintenance  Topic Date Due  . TETANUS/TDAP  03/02/1967  . Fecal DNA (Cologuard)  03/01/1998  . DEXA SCAN  03/01/2013  . MAMMOGRAM  05/27/2019  . INFLUENZA VACCINE  Completed  . Hepatitis C Screening  Completed  . PNA vac Low Risk Adult  Completed     Plan:    End of life planning; Advance aging; Advanced directives discussed. Copy of current HCPOA/Living Will requested.    I have personally reviewed and noted the following in the patient's chart:   . Medical and social history . Use of alcohol, tobacco or illicit drugs  . Current medications and supplements . Functional ability and status . Nutritional status . Physical activity . Advanced directives . List of other physicians . Hospitalizations, surgeries, and ER visits in previous 12 months . Vitals . Screenings  to include cognitive, depression, and  falls . Referrals and appointments  In addition, I have reviewed and discussed with patient certain preventive protocols, quality metrics, and best practice recommendations. A written personalized care plan for preventive services as well as general preventive health recommendations were provided to patient.     Varney Biles, LPN   91/05/[id number]

## 2018-03-24 ENCOUNTER — Encounter: Payer: Self-pay | Admitting: *Deleted

## 2018-03-24 LAB — HEPATITIS C ANTIBODY
HEP C AB: NONREACTIVE
SIGNAL TO CUT-OFF: 0.01 (ref <1.00)

## 2018-03-24 NOTE — Progress Notes (Signed)
No patient concerns at this time. Follow with PCP

## 2018-05-05 ENCOUNTER — Ambulatory Visit (INDEPENDENT_AMBULATORY_CARE_PROVIDER_SITE_OTHER): Payer: PPO | Admitting: Internal Medicine

## 2018-05-05 ENCOUNTER — Encounter: Payer: Self-pay | Admitting: Internal Medicine

## 2018-05-05 VITALS — BP 122/70 | HR 78 | Temp 98.6°F | Ht 60.75 in | Wt 141.0 lb

## 2018-05-05 DIAGNOSIS — R002 Palpitations: Secondary | ICD-10-CM

## 2018-05-05 DIAGNOSIS — E782 Mixed hyperlipidemia: Secondary | ICD-10-CM

## 2018-05-05 NOTE — Progress Notes (Signed)
Pre visit review using our clinic review tool, if applicable. No additional management support is needed unless otherwise documented below in the visit note. 

## 2018-05-05 NOTE — Patient Instructions (Addendum)
Shingrix 2nd dose due and consider Tdap vaccine discuss with [facility] We need to get records from [facility]    Tdap Vaccine (Tetanus, Diphtheria and Pertussis): What You Need to Know 1. Why get vaccinated? Tetanus, diphtheria and pertussis are very serious diseases. Tdap vaccine can protect [place] from these diseases. And, Tdap vaccine given to pregnant women can protect newborn babies against pertussis.Marland Kitchen TETANUS (Lockjaw) is rare in the [place] today. It causes painful muscle tightening and stiffness, usually all over the body.  It can lead to tightening of muscles in the head and neck so you can't open your mouth, swallow, or sometimes even breathe. Tetanus kills about 1 out of 10 people who are infected even after receiving the best medical care. DIPHTHERIA is also rare in the [place] today. It can cause a thick coating to form in the back of the throat.  It can lead to breathing problems, heart failure, paralysis, and death. PERTUSSIS (Whooping Cough) causes severe coughing spells, which can cause difficulty breathing, vomiting and disturbed sleep.  It can also lead to weight loss, incontinence, and rib fractures. Up to 2 in 100 adolescents and 5 in 100 adults with pertussis are hospitalized or have complications, which could include pneumonia or death. These diseases are caused by bacteria. Diphtheria and pertussis are spread from person to person through secretions from coughing or sneezing. Tetanus enters the body through cuts, scratches, or wounds. Before vaccines, as many as 200,000 cases of diphtheria, 200,000 cases of pertussis, and hundreds of cases of tetanus, were reported in the [place] each year. Since vaccination began, reports of cases for tetanus and diphtheria have dropped by about 99% and for pertussis by about 80%. 2. Tdap vaccine Tdap vaccine can protect adolescents and adults from tetanus, diphtheria, and pertussis. One dose of Tdap is routinely given at age  72 or 23. People who did not get Tdap at that age should get it as soon as possible. Tdap is especially important for healthcare professionals and anyone having close contact with a baby younger than 12 months. Pregnant women should get a dose of Tdap during every pregnancy, to protect the newborn from pertussis. Infants are most at risk for severe, life-threatening complications from pertussis. Another vaccine, called Td, protects against tetanus and diphtheria, but not pertussis. A Td booster should be given every 10 years. Tdap may be given as one of these boosters if you have never gotten Tdap before. Tdap may also be given after a severe cut or burn to prevent tetanus infection. Your doctor or the person giving you the vaccine can give you more information. Tdap may safely be given at the same time as other vaccines. 3. Some people should not get this vaccine  A person who has ever had a life-threatening allergic reaction after a previous dose of any diphtheria, tetanus or pertussis containing vaccine, OR has a severe allergy to any part of this vaccine, should not get Tdap vaccine. Tell the person giving the vaccine about any severe allergies.  Anyone who had coma or long repeated seizures within 7 days after a childhood dose of DTP or DTaP, or a previous dose of Tdap, should not get Tdap, unless a cause other than the vaccine was found. They can still get Td.  Talk to your doctor if you: ? have seizures or another nervous system problem, ? had severe pain or swelling after any vaccine containing diphtheria, tetanus or pertussis, ? ever had a condition called Guillain-Barr  Syndrome (GBS), ? aren't feeling well on the day the shot is scheduled. 4. Risks With any medicine, including vaccines, there is a chance of side effects. These are usually mild and go away on their own. Serious reactions are also possible but are rare. Most people who get Tdap vaccine do not have any problems with  it. Mild problems following Tdap (Did not interfere with activities)  Pain where the shot was given (about 3 in 4 adolescents or 2 in 3 adults)  Redness or swelling where the shot was given (about 1 person in 5)  Mild fever of at least 100.40F (up to about 1 in 25 adolescents or 1 in 100 adults)  Headache (about 3 or 4 people in 10)  Tiredness (about 1 person in 3 or 4)  Nausea, vomiting, diarrhea, stomach ache (up to 1 in 4 adolescents or 1 in 10 adults)  Chills, sore joints (about 1 person in 10)  Body aches (about 1 person in 3 or 4)  Rash, swollen glands (uncommon) Moderate problems following Tdap (Interfered with activities, but did not require medical attention)  Pain where the shot was given (up to 1 in 5 or 6)  Redness or swelling where the shot was given (up to about 1 in 16 adolescents or 1 in 12 adults)  Fever over 102F (about 1 in 100 adolescents or 1 in 250 adults)  Headache (about 1 in 7 adolescents or 1 in 10 adults)  Nausea, vomiting, diarrhea, stomach ache (up to 1 or 3 people in 100)  Swelling of the entire arm where the shot was given (up to about 1 in 500). Severe problems following Tdap (Unable to perform usual activities; required medical attention)  Swelling, severe pain, bleeding and redness in the arm where the shot was given (rare). Problems that could happen after any vaccine:  People sometimes faint after a medical procedure, including vaccination. Sitting or lying down for about 15 minutes can help prevent fainting, and injuries caused by a fall. Tell your doctor if you feel dizzy, or have vision changes or ringing in the ears.  Some people get severe pain in the shoulder and have difficulty moving the arm where a shot was given. This happens very rarely.  Any medication can cause a severe allergic reaction. Such reactions from a vaccine are very rare, estimated at fewer than 1 in a million doses, and would happen within a few minutes to a  few hours after the vaccination. As with any medicine, there is a very remote chance of a vaccine causing a serious injury or death. The safety of vaccines is always being monitored. For more information, visit: http://www.aguilar.org/ 5. What if there is a serious problem? What should I look for?  Look for anything that concerns you, such as signs of a severe allergic reaction, very high fever, or unusual behavior. Signs of a severe allergic reaction can include hives, swelling of the face and throat, difficulty breathing, a fast heartbeat, dizziness, and weakness. These would usually start a few minutes to a few hours after the vaccination. What should I do?  If you think it is a severe allergic reaction or other emergency that can't wait, call 9-1-1 or get the person to the nearest hospital. Otherwise, call your doctor.  Afterward, the reaction should be reported to the Vaccine Adverse Event Reporting System (VAERS). Your doctor might file this report, or you can do it yourself through the VAERS web site at www.vaers.SamedayNews.es, or by calling  513 151 7532. VAERS does not give medical advice. 6. The National Vaccine Injury Compensation Program The [employer] Vaccine Injury Compensation Program (VICP) is a federal program that was created to compensate people who may have been injured by certain vaccines. Persons who believe they may have been injured by a vaccine can learn about the program and about filing a claim by calling 207-084-3418 or visiting the [place] website at GoldCloset.com.ee. There is a time limit to file a claim for compensation. 7. How can I learn more?  Ask your doctor. He or she can give you the vaccine package insert or suggest other sources of information.  Call your local or state health department.  Contact the Centers for Disease Control and Prevention (CDC): ? Call 319 531 7334 (1-800-CDC-INFO) or ? Visit CDC's website at  http://hunter.com/ Vaccine Information Statement Tdap Vaccine (06/22/2013) This information is not intended to replace advice given to you by your health care provider. Make sure you discuss any questions you have with your health care provider. Document Released: 10/15/2011 Document Revised: 12/01/2017 Document Reviewed: 12/01/2017 Elsevier Interactive Patient Education  2019 [employer].

## 2018-05-05 NOTE — Progress Notes (Signed)
Chief Complaint  Patient presents with  . Follow-up   F/u doing well  1. Palpitations saw Dr. Rockey Situ fall 2019 normal heart with mild valve abnormalities no palpitations since, since she has quit her job and less stress and thinks could be related 2. mammo and DEXA pending    Review of Systems  Constitutional: Negative for weight loss.  HENT: Negative for hearing loss.   Eyes: Negative for blurred vision.  Respiratory: Negative for shortness of breath.   Cardiovascular: Negative for chest pain and palpitations.  Gastrointestinal: Negative for abdominal pain.  Musculoskeletal: Negative for falls.  Skin: Negative for rash.  Neurological: Negative for headaches.  Psychiatric/Behavioral: Negative for depression.   Past Medical History:  Diagnosis Date  . Arthritis   . Breast mass    right  . Cancer ([place])    skin cancer SCC, BCC multiple   . Headache   . History of bronchitis   . History of pneumonia   . Hyperlipidemia    Past Surgical History:  Procedure Laterality Date  . BREAST LUMPECTOMY     right 2016 negative malignancy fibrocystic changes   . COLONOSCOPY    . DILATION AND CURETTAGE OF UTERUS    . JOINT REPLACEMENT     left   . MOHS SURGERY     x2 face   . MOHS SURGERY     L alar 02/04/18   . RADIOACTIVE SEED GUIDED EXCISIONAL BREAST BIOPSY Right 04/06/2015   Procedure: RADIOACTIVE SEED GUIDED EXCISIONAL RIGHT BREAST BIOPSY;  Surgeon: Stark Klein, MD;  Location: [facility];  Service: General;  Laterality: Right;  . 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   Procedure: TOTAL HIP ARTHROPLASTY ANTERIOR APPROACH;  Surgeon: Renette Butters, MD;  Location: [facility];  Service: Orthopedics;  Laterality: Left;  . WISDOM TOOTH EXTRACTION     Family History  Problem Relation Age of Onset  . Stroke Mother   . Alzheimer's disease Father    Social History   Socioeconomic History  . Marital status: Married    Spouse name: Not on file  . Number of children: Not on file  . Years of  education: Not on file  . Highest education level: Not on file  Occupational History  . Not on file  Social Needs  . Financial resource strain: Not hard at all  . Food insecurity:    Worry: Never true    Inability: Never true  . Transportation needs:    Medical: No    Non-medical: No  Tobacco Use  . Smoking status: Never Smoker  . Smokeless tobacco: Never Used  Substance and Sexual Activity  . Alcohol use: Yes    Comment: social  . Drug use: No  . Sexual activity: Not Currently  Lifestyle  . Physical activity:    Days per week: Not on file    Minutes per session: Not on file  . Stress: Not at all  Relationships  . Social connections:    Talks on phone: Not on file    Gets together: Not on file    Attends religious service: Not on file    Active member of club or organization: Not on file    Attends meetings of clubs or organizations: Not on file    Relationship status: Not on file  . Intimate partner violence:    Fear of current or ex partner: No    Emotionally abused: No    Physically abused: No    Forced sexual activity: No  Other Topics Concern  . 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 aspirin 81 MG tablet Take 81 mg by mouth daily.  Marland Kitchen atorvastatin (LIPITOR) 10 MG tablet Take 10 mg by mouth daily.  . Biotin 10 MG CAPS Take 1,000 mg by mouth daily.   . Calcium Carb-Cholecalciferol ([employer] CALCIUM 600 & VITAMIN D3 PO) Take by mouth 2 (two) times daily after a meal.  . calcium carbonate (OS-CAL) 1250 (500 CA) MG chewable tablet Chew 1 tablet by mouth daily.  . cholecalciferol (VITAMIN D) 1000 UNITS tablet Take 1,000 Units by mouth daily.  Javier Docker Oil 1000 MG CAPS Take 3 capsules by mouth daily.   . Multiple Vitamin (MULTIVITAMIN WITH MINERALS) TABS tablet Take 1 tablet by mouth daily.  . vitamin C (ASCORBIC ACID) 250  MG tablet Take 250 mg by mouth daily.   Allergies  Allergen Reactions  . Aspartame And Phenylalanine   . Meloxicam Hives and Diarrhea  . Other Itching    Sunscreens Lotions and Tanning products   Recent Results (from the past 2160 hour(s))  Hepatitis C antibody screen     Status: None   Collection Time: 03/23/18  9:33 AM  Result Value Ref Range   Hepatitis C Ab NON-REACTIVE NON-REACTI   SIGNAL TO CUT-OFF 0.01 <1.00    Comment: . HCV antibody was non-reactive. There is no laboratory  evidence of HCV infection. . In most cases, no further action is required. However, if recent HCV exposure is suspected, a test for HCV RNA (test code [phone]) is suggested. . For additional information please refer to http://education.questdiagnostics.com/faq/FAQ22v1 (This link is being provided for informational/ educational purposes only.) .    Objective  Body mass index is 26.86 kg/m. Wt Readings from Last 3 Encounters:  05/05/18 141 lb (64 kg)  03/23/18 140 lb 6.4 oz (63.7 kg)  03/09/18 141 lb 8 oz (64.2 kg)   Temp Readings from Last 3 Encounters:  05/05/18 98.6 F (37 C) (Oral)  03/23/18 98.5 F (36.9 C) (Oral)  01/01/18 98.6 F (37 C) (Oral)   BP Readings from Last 3 Encounters:  05/05/18 122/70  03/23/18 120/80  03/09/18 138/72   Pulse Readings from Last 3 Encounters:  05/05/18 78  03/23/18 70  03/09/18 66    Physical Exam Vitals signs and nursing note reviewed.  Constitutional:      Appearance: Normal appearance. She is well-developed.  HENT:     Head: Normocephalic and atraumatic.     Mouth/Throat:     Mouth: Mucous membranes are moist.     Pharynx: Oropharynx is clear.  Eyes:     Conjunctiva/sclera: Conjunctivae normal.     Pupils: Pupils are equal, round, and reactive to light.  Cardiovascular:     Rate and Rhythm: Normal rate and regular rhythm.     Heart sounds: Normal heart sounds.  Pulmonary:     Effort: Pulmonary effort is normal.     Breath sounds:  Normal breath sounds.  Skin:    General: Skin is warm and dry.  Neurological:     General: No focal deficit present.     Mental Status: She is alert and oriented to person, place, and time.     Gait: Gait normal.  Psychiatric:  Attention and Perception: Attention and perception normal.        Mood and Affect: Mood and affect normal.        Speech: Speech normal.        Behavior: Behavior normal. Behavior is cooperative.        Thought Content: Thought content normal.        Cognition and Memory: Cognition and memory normal.        Judgment: Judgment normal.     Assessment   1. Palpitations resolved  2. HLD 3. HM Plan   1. Monitor  2. Cont meds lipitor 10 mg qhs  3.  Do fasting labs at f/u   Had flu shot 12/26/17 shingrix 1/2 12/26/17 2nd dose due disc'ed with pt  rec Tdap disc today utd prevnar and pna 23   Out of age window pap  Referred mammo due 05/26/2018 sch with DEXA 05/28/2018 cologuard per pt down get records [facility] get records [facility] possibly osteopenia  H/o skin SCC, BCC multiple f/u Dr. Laurence Ferrari pending mohs 3rd to nose 01/2018 Dr. Elvera Bicker [facility]   [facility] left alar nose mohs on 02/04/18 Dr. Manley Mason, derm f/u 05/2018  Provider: Dr. Olivia Mackie [facility]

## 2018-05-14 ENCOUNTER — Telehealth: Payer: Self-pay | Admitting: Internal Medicine

## 2018-05-14 NOTE — Telephone Encounter (Signed)
HIM Dept received 7 pages of medical records from [facility]. Sending via interoffice mail to Dr. Terese Door  05/14/18  LM

## 2018-05-18 ENCOUNTER — Encounter: Payer: Self-pay | Admitting: Internal Medicine

## 2018-05-27 ENCOUNTER — Encounter: Payer: Self-pay | Admitting: Internal Medicine

## 2018-05-28 ENCOUNTER — Ambulatory Visit
Admission: RE | Admit: 2018-05-28 | Discharge: 2018-05-28 | Disposition: A | Discharge status: Home / Self Care | Payer: PPO | Source: Ambulatory Visit | Attending: Family Medicine | Admitting: Family Medicine

## 2018-05-28 ENCOUNTER — Ambulatory Visit
Admission: RE | Admit: 2018-05-28 | Discharge: 2018-05-28 | Disposition: A | Discharge status: Home / Self Care | Payer: PPO | Source: Ambulatory Visit | Attending: Internal Medicine | Admitting: Internal Medicine

## 2018-05-28 DIAGNOSIS — Z1231 Encounter for screening mammogram for malignant neoplasm of breast: Secondary | ICD-10-CM

## 2018-05-28 DIAGNOSIS — M8589 Other specified disorders of bone density and structure, multiple sites: Secondary | ICD-10-CM | POA: Diagnosis not present

## 2018-05-28 DIAGNOSIS — Z13820 Encounter for screening for osteoporosis: Secondary | ICD-10-CM

## 2018-05-28 DIAGNOSIS — Z780 Asymptomatic menopausal state: Secondary | ICD-10-CM | POA: Diagnosis not present

## 2018-06-17 DIAGNOSIS — D229 Melanocytic nevi, unspecified: Secondary | ICD-10-CM | POA: Diagnosis not present

## 2018-06-17 DIAGNOSIS — Z1283 Encounter for screening for malignant neoplasm of skin: Secondary | ICD-10-CM | POA: Diagnosis not present

## 2018-06-17 DIAGNOSIS — D1800 Hemangioma unspecified site: Secondary | ICD-10-CM | POA: Diagnosis not present

## 2018-06-17 DIAGNOSIS — L570 Actinic keratosis: Secondary | ICD-10-CM | POA: Diagnosis not present

## 2018-06-17 DIAGNOSIS — L578 Other skin changes due to chronic exposure to nonionizing radiation: Secondary | ICD-10-CM | POA: Diagnosis not present

## 2018-06-17 DIAGNOSIS — L821 Other seborrheic keratosis: Secondary | ICD-10-CM | POA: Diagnosis not present

## 2018-06-17 DIAGNOSIS — Z85828 Personal history of other malignant neoplasm of skin: Secondary | ICD-10-CM | POA: Diagnosis not present

## 2018-07-13 ENCOUNTER — Other Ambulatory Visit: Payer: Self-pay

## 2018-07-13 ENCOUNTER — Encounter: Payer: Self-pay | Admitting: Family Medicine

## 2018-07-13 ENCOUNTER — Ambulatory Visit (INDEPENDENT_AMBULATORY_CARE_PROVIDER_SITE_OTHER): Payer: PPO

## 2018-07-13 ENCOUNTER — Ambulatory Visit (INDEPENDENT_AMBULATORY_CARE_PROVIDER_SITE_OTHER): Payer: PPO | Admitting: Family Medicine

## 2018-07-13 VITALS — BP 112/80 | HR 80 | Temp 98.2°F | Ht 60.75 in | Wt 141.2 lb

## 2018-07-13 DIAGNOSIS — M25532 Pain in left wrist: Secondary | ICD-10-CM

## 2018-07-13 DIAGNOSIS — M1812 Unilateral primary osteoarthritis of first carpometacarpal joint, left hand: Secondary | ICD-10-CM | POA: Diagnosis not present

## 2018-07-13 MED ORDER — METHYLPREDNISOLONE ACETATE 40 MG/ML IJ SUSP
40.0000 mg | Freq: Once | INTRAMUSCULAR | Status: AC
  Administered 2018-07-13: 40 mg via INTRAMUSCULAR

## 2018-07-13 NOTE — Progress Notes (Signed)
Subjective:    Patient ID: Andrea Spencer, female    DOB: Dec 30, 1945, 73 y.o.   MRN: [id number]  HPI   Patient presents to clinic complaining of pain in left wrist off and on for a month.  States she notices the pain especially when picking up her 71-year-old granddaughter, often will pick her up with both hands, and then hold her more so with her left arm.  Denies any recent falls or known wrist injury.  Has been taking Tylenol as needed and using soft wrist brace with okay effect in helping to reduce pain.  Patient Active Problem List   Diagnosis Date Noted  . Chest tightness 03/09/2018  . Palpitations 03/08/2018  . Murmur 03/08/2018  . Hyperlipidemia 01/01/2018  . History of skin cancer 01/01/2018  . Primary osteoarthritis of left hip 01/15/2016   Social History   Tobacco Use  . Smoking status: Never Smoker  . Smokeless tobacco: Never Used  Substance Use Topics  . Alcohol use: Yes    Comment: social   Review of Systems  Constitutional: Negative for chills, fatigue and fever.  HENT: Negative for congestion, ear pain, sinus pain and sore throat.   Eyes: Negative.   Respiratory: Negative for cough, shortness of breath and wheezing.   Cardiovascular: Negative for chest pain, palpitations and leg swelling.  Gastrointestinal: Negative for abdominal pain, diarrhea, nausea and vomiting.  Genitourinary: Negative for dysuria, frequency and urgency.  Musculoskeletal: Negative for arthralgias and myalgias.  Skin: Negative for color change, pallor and rash.  Neurological: Negative for syncope, light-headedness and headaches.  Psychiatric/Behavioral: The patient is not nervous/anxious.       Objective:   Physical Exam Vitals signs and nursing note reviewed.  Constitutional:      General: She is not in acute distress.    Appearance: She is not toxic-appearing.  HENT:     Head: Normocephalic and atraumatic.  Cardiovascular:     Rate and Rhythm: Normal rate and regular  rhythm.  Pulmonary:     Effort: Pulmonary effort is normal. No respiratory distress.     Breath sounds: Normal breath sounds.  Musculoskeletal:     Left wrist: She exhibits tenderness.       Arms:     Comments: Location of left wrist pain indicated by red mark on diagram.  She is able to move wrist side to side up and down, able to make a full fist and extend all fingers.  No obvious swelling or redness seen.  No deformity.  Skin:    General: Skin is warm and dry.     Coloration: Skin is not jaundiced or pale.     Findings: No erythema.  Neurological:     Mental Status: She is alert and oriented to person, place, and time.  Psychiatric:        Mood and Affect: Mood normal.        Behavior: Behavior normal.    Vitals:   07/13/18 1017  BP: 112/80  Pulse: 80  Temp: 98.2 F (36.8 C)  SpO2: 95%      Assessment & Plan:    Left wrist pain- overall benign appearing exam of left wrist.  We will get x-ray to further investigate.  Advised to use wrist brace as she already has been for joint stability, using Tylenol as needed, also discussed topical rubs such as BenGay or Biofreeze to help with joint pain.  Patient will get IM methylprednisolone 40 mg x 1 in clinic to  help reduce inflammation and swelling.  Administrations This Visit    methylPREDNISolone acetate (DEPO-MEDROL) injection 40 mg    Admin Date 07/13/2018 Action Given Dose 40 mg Route Intramuscular Administered By Myriam Forehand, CMA         Results of x-ray will determine next step in plan of care.  Discussed possibility of doing physical therapy or seeing orthopedics.

## 2018-07-13 NOTE — Patient Instructions (Signed)
Wrist Pain, Adult  There are many things that can cause wrist pain. Some common causes include:   An injury to the wrist area.   Overuse of the joint.   A condition that causes too much pressure to be put on a nerve in the wrist (carpal tunnel syndrome).   Wear and tear of the joints that happens as a person gets older (osteoarthritis).   Other types of arthritis.  Sometimes, the cause of wrist pain is not known. Often, the pain goes away when you follow your doctor's instructions for helping pain at home, such as resting or icing your wrist. If your wrist pain does not go away, it is important to tell your doctor.  Follow these instructions at home:   Rest the wrist area for 48 hours or more, or as long as told by your doctor.   If a splint or elastic bandage has been put on your wrist, use it as told by your doctor.  ? Take off the splint or bandage only as told by your doctor.  ? Loosen the splint or bandage if your fingers tingle, lose feeling (get numb), or turn cold or blue.   If directed, apply ice to the injured area:  ? If you have a removable splint or elastic bandage, remove it as told by your doctor.  ? Put ice in a plastic bag.  ? Place a towel between your skin and the bag or between your splint or bandage and the bag.  ? Leave the ice on for 20 minutes, 2-3 times a day.     Keep your arm raised (elevated) above the level of your heart while you are sitting or lying down.   Take over-the-counter and prescription medicines only as told by your doctor.   Keep all follow-up visits as told by your doctor. This is important.  Contact a doctor if:   You have a sudden sharp pain in the wrist, hand, or arm that is different or new.   The swelling or bruising on your wrist or hand gets worse.   Your skin becomes red, gets a rash, or has open sores.   Your pain does not get better or it gets worse.  Get help right away if:   You lose feeling in your fingers or hand.   Your fingers turn white,  very red, or cold and blue.   You cannot move your fingers.   You have a fever or chills.  This information is not intended to replace advice given to you by your health care provider. Make sure you discuss any questions you have with your health care provider.  Document Released: 10/02/2007 Document Revised: 11/09/2015 Document Reviewed: 11/02/2015  Elsevier Interactive Patient Education  2019 [employer] Inc[employer]

## 2018-08-19 DIAGNOSIS — D485 Neoplasm of uncertain behavior of skin: Secondary | ICD-10-CM | POA: Diagnosis not present

## 2018-08-19 DIAGNOSIS — L570 Actinic keratosis: Secondary | ICD-10-CM | POA: Diagnosis not present

## 2018-08-19 DIAGNOSIS — L821 Other seborrheic keratosis: Secondary | ICD-10-CM | POA: Diagnosis not present

## 2018-10-05 ENCOUNTER — Telehealth: Payer: Self-pay | Admitting: *Deleted

## 2018-10-05 ENCOUNTER — Other Ambulatory Visit: Payer: Self-pay | Admitting: Internal Medicine

## 2018-10-05 DIAGNOSIS — B359 Dermatophytosis, unspecified: Secondary | ICD-10-CM

## 2018-10-05 MED ORDER — KETOCONAZOLE 2 % EX CREA: 1.0000 "application " | TOPICAL_CREAM | Freq: Two times a day (BID) | CUTANEOUS | 11 refills | Status: DC | PRN

## 2018-10-05 NOTE — Telephone Encounter (Signed)
This is my 1st time hearing for a request for this  Where is she using it?  This is an antifungal cream

## 2018-10-05 NOTE — Telephone Encounter (Signed)
Copied from [place] [id number]. Topic: General - Other >> Oct 05, 2018 10:05 AM Jodie Echevaria wrote: Reason for CRM: Patient called to request an Rx to be sent to the pharmacy for Ketoconazole cream 2%  60g. Say that she asked Dr Olivia Mackie for this but have yet to hear anything. Patient is asking for a call back when Rx is sent please or for more questions. Ph# [phone]

## 2018-10-06 NOTE — Telephone Encounter (Signed)
Pt called in and stated that she uses cream for under her breast when it really hot.  She state they are heavy and when it is hot out side she will develop a rash underneath

## 2018-10-06 NOTE — Telephone Encounter (Signed)
Left message for patient to return call back. [facility] may obtain information.  

## 2018-10-07 NOTE — Telephone Encounter (Signed)
She she use gold bond powder to keep her dry when not broken out?  otc

## 2018-10-07 NOTE — Telephone Encounter (Signed)
mychart sent to patient

## 2018-10-29 ENCOUNTER — Encounter: Payer: Self-pay | Admitting: Internal Medicine

## 2018-10-29 ENCOUNTER — Telehealth: Payer: Self-pay | Admitting: Internal Medicine
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
## 2018-11-05 ENCOUNTER — Ambulatory Visit: Payer: PPO | Admitting: Internal Medicine

## 2018-12-17 ENCOUNTER — Other Ambulatory Visit: Payer: Self-pay

## 2018-12-17 DIAGNOSIS — Z20822 Contact with and (suspected) exposure to covid-19: Secondary | ICD-10-CM

## 2018-12-18 LAB — NOVEL CORONAVIRUS, NAA: SARS-CoV-2, NAA: NOT DETECTED

## 2018-12-23 ENCOUNTER — Encounter: Payer: Self-pay | Admitting: Internal Medicine

## 2018-12-24 DIAGNOSIS — C44619 Basal cell carcinoma of skin of left upper limb, including shoulder: Secondary | ICD-10-CM | POA: Diagnosis not present

## 2018-12-24 DIAGNOSIS — Z85828 Personal history of other malignant neoplasm of skin: Secondary | ICD-10-CM | POA: Diagnosis not present

## 2018-12-24 DIAGNOSIS — D229 Melanocytic nevi, unspecified: Secondary | ICD-10-CM | POA: Diagnosis not present

## 2018-12-24 DIAGNOSIS — D485 Neoplasm of uncertain behavior of skin: Secondary | ICD-10-CM | POA: Diagnosis not present

## 2018-12-24 DIAGNOSIS — C44519 Basal cell carcinoma of skin of other part of trunk: Secondary | ICD-10-CM | POA: Diagnosis not present

## 2018-12-24 DIAGNOSIS — L821 Other seborrheic keratosis: Secondary | ICD-10-CM | POA: Diagnosis not present

## 2018-12-24 DIAGNOSIS — S80871A Other superficial bite, right lower leg, initial encounter: Secondary | ICD-10-CM | POA: Diagnosis not present

## 2018-12-24 DIAGNOSIS — L820 Inflamed seborrheic keratosis: Secondary | ICD-10-CM | POA: Diagnosis not present

## 2018-12-24 DIAGNOSIS — S80872A Other superficial bite, left lower leg, initial encounter: Secondary | ICD-10-CM | POA: Diagnosis not present

## 2018-12-24 DIAGNOSIS — D2361 Other benign neoplasm of skin of right upper limb, including shoulder: Secondary | ICD-10-CM | POA: Diagnosis not present

## 2018-12-24 DIAGNOSIS — L814 Other melanin hyperpigmentation: Secondary | ICD-10-CM | POA: Diagnosis not present

## 2018-12-29 ENCOUNTER — Ambulatory Visit (INDEPENDENT_AMBULATORY_CARE_PROVIDER_SITE_OTHER): Payer: PPO

## 2018-12-29 ENCOUNTER — Telehealth: Payer: Self-pay | Admitting: Internal Medicine

## 2018-12-29 ENCOUNTER — Other Ambulatory Visit: Payer: Self-pay

## 2018-12-29 DIAGNOSIS — Z23 Encounter for immunization: Secondary | ICD-10-CM

## 2018-12-29 NOTE — Telephone Encounter (Signed)
Left msg for pt to call ofc to resch on 09/01 ry

## 2018-12-29 NOTE — Telephone Encounter (Signed)
Tdap she needs to have at pharmacy   Flu okay  [place]

## 2018-12-29 NOTE — Telephone Encounter (Signed)
Patient has requested tetanus shot today with her flu after reviewing chart looks like patient should have Tdap ok to proceed with Tdap? Please send response to [facility].

## 2018-12-29 NOTE — Telephone Encounter (Signed)
Patient given Rx for Tdap.

## 2019-01-13 ENCOUNTER — Encounter: Payer: Self-pay | Admitting: Internal Medicine

## 2019-01-15 ENCOUNTER — Encounter: Payer: Self-pay | Admitting: Internal Medicine

## 2019-01-26 ENCOUNTER — Encounter: Payer: Self-pay | Admitting: Internal Medicine

## 2019-02-04 DIAGNOSIS — S61001A Unspecified open wound of right thumb without damage to nail, initial encounter: Secondary | ICD-10-CM | POA: Diagnosis not present

## 2019-02-04 DIAGNOSIS — C44519 Basal cell carcinoma of skin of other part of trunk: Secondary | ICD-10-CM | POA: Diagnosis not present

## 2019-02-04 DIAGNOSIS — C44619 Basal cell carcinoma of skin of left upper limb, including shoulder: Secondary | ICD-10-CM | POA: Diagnosis not present

## 2019-03-19 ENCOUNTER — Other Ambulatory Visit: Payer: Self-pay

## 2019-03-26 ENCOUNTER — Ambulatory Visit: Payer: PPO

## 2019-03-26 ENCOUNTER — Ambulatory Visit: Payer: PPO | Admitting: Internal Medicine

## 2019-04-01 ENCOUNTER — Ambulatory Visit (INDEPENDENT_AMBULATORY_CARE_PROVIDER_SITE_OTHER): Payer: PPO

## 2019-04-01 ENCOUNTER — Other Ambulatory Visit: Payer: Self-pay

## 2019-04-01 ENCOUNTER — Other Ambulatory Visit: Payer: Self-pay | Admitting: Internal Medicine

## 2019-04-01 DIAGNOSIS — Z0000 Encounter for general adult medical examination without abnormal findings: Secondary | ICD-10-CM

## 2019-04-01 DIAGNOSIS — E785 Hyperlipidemia, unspecified: Secondary | ICD-10-CM

## 2019-04-01 MED ORDER — ATORVASTATIN CALCIUM 10 MG PO TABS: 10.0000 mg | ORAL_TABLET | Freq: Every day | ORAL | 3 refills | Status: DC

## 2019-04-01 NOTE — Patient Instructions (Signed)
  Ms. Chhay , Thank you for taking time to come for your Medicare Wellness Visit. I appreciate your ongoing commitment to your health goals. Please review the following plan we discussed and let me know if I can assist you in the future.   These are the goals we discussed: Goals    . Increase physical activity     Continue to walk for exercise       This is a list of the screening recommended for you and due dates:  Health Maintenance  Topic Date Due  . Cologuard (Stool DNA test)  05/13/2020  . Mammogram  05/28/2020  . Tetanus Vaccine  12/30/2028  . Flu Shot  Completed  . DEXA scan (bone density measurement)  Completed  .  Hepatitis C: One time screening is recommended by Center for Disease Control  (CDC) for  adults born from [birth year] through 1963.   Completed  . Pneumonia vaccines  Completed

## 2019-04-01 NOTE — Progress Notes (Addendum)
Subjective:   Andrea Spencer is a 73 y.o. female who presents for Medicare Annual (Subsequent) preventive examination.  Review of Systems:  No ROS.  Medicare Wellness Virtual Visit.  Visual/audio telehealth visit, UTA vital signs.   See social history for additional risk factors.   Cardiac Risk Factors include: advanced age (>79men, >110 women)     Objective:     Vitals: There were no vitals taken for this visit.  There is no height or weight on file to calculate BMI.  Advanced Directives 04/01/2019 03/23/2018 01/18/2016 04/05/2015 03/17/2015  Does Patient Have a Medical Advance Directive? No Yes No No No  Type of Advance Directive - Healthcare Power of [place];Living will - - -  Does patient want to make changes to medical advance directive? - No - Patient declined - - -  Copy of [place] in Chart? - No - copy requested - - -  Would patient like information on creating a medical advance directive? No - Patient declined - No - patient declined information No - patient declined information -    Tobacco Social History   Tobacco Use  Smoking Status Never Smoker  Smokeless Tobacco Never Used     Counseling given: Not Answered   Clinical Intake:  Pre-visit preparation completed: Yes        Diabetes: No  How often do you need to have someone help you when you read instructions, pamphlets, or other written materials from your doctor or pharmacy?: 1 - Never  Interpreter Needed?: No     Past Medical History:  Diagnosis Date  . Arthritis   . Breast mass    right  . Cancer ([place])    skin cancer SCC, BCC multiple   . Headache   . History of bronchitis   . History of pneumonia   . Hyperlipidemia    Past Surgical History:  Procedure Laterality Date  . BREAST LUMPECTOMY     right 2016 negative malignancy fibrocystic changes   . COLONOSCOPY    . DILATION AND CURETTAGE OF UTERUS    . JOINT REPLACEMENT     left   . MOHS SURGERY     x2  face   . MOHS SURGERY     L alar 02/04/18   . RADIOACTIVE SEED GUIDED EXCISIONAL BREAST BIOPSY Right 04/06/2015   Procedure: RADIOACTIVE SEED GUIDED EXCISIONAL RIGHT BREAST BIOPSY;  Surgeon: Stark Klein, MD;  Location: [facility];  Service: General;  Laterality: Right;  . 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 WISDOM TOOTH EXTRACTION     Family History  Problem Relation Age of Onset  . Stroke Mother   . Alzheimer's disease Father    Social History   Socioeconomic History  . Marital status: Widowed    Spouse name: Not on file  . Number of children: Not on file  . Years of education: Not on file  . Highest education level: Not on file  Occupational History  . Not on file  Social Needs  . Financial resource strain: Not hard at all  . Food insecurity    Worry: Never true    Inability: Never true  . Transportation needs    Medical: No    Non-medical: No  Tobacco Use  . Smoking status: Never Smoker  . Smokeless tobacco: Never Used  Substance and Sexual  Activity  . Alcohol use: Yes    Comment: social  . Drug use: No  . Sexual activity: Not Currently  Lifestyle  . Physical activity    Days per week: 4 days    Minutes per session: 30 min  . Stress: Not at all  Relationships  . Social Herbalist on phone: Not on file    Gets together: Not on file    Attends religious service: Not on file    Active member of club or organization: Not on file    Attends meetings of clubs or organizations: Not on file    Relationship status: Not on file  Other Topics Concern  . 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 aspirin 81 MG tablet Take 81 mg by mouth daily.  Marland Kitchen atorvastatin (LIPITOR) 10 MG tablet Take 10 mg by mouth daily.  . Biotin 10 MG CAPS Take 1,000 mg by mouth daily.   . Calcium Carb-Cholecalciferol ([employer]S CALCIUM 600 & VITAMIN D3 PO) Take by mouth 2 (two) times daily after a meal.  . calcium carbonate (OS-CAL) 1250 (500 CA) MG chewable tablet Chew 1 tablet by mouth daily.  . cholecalciferol (VITAMIN D) 1000 UNITS tablet Take 1,000 Units by mouth daily.  Marland Kitchen ketoconazole (NIZORAL) 2 % cream Apply 1 application topically 2 (two) times daily as needed for irritation.  Javier Docker Oil 1000 MG CAPS Take 3 capsules by mouth daily.   . Multiple Vitamin (MULTIVITAMIN WITH MINERALS) TABS tablet Take 1 tablet by mouth daily.  . vitamin C (ASCORBIC ACID) 250 MG tablet Take 500 mg by mouth daily. Taking two tablets daily   No facility-administered encounter medications on file as of 04/01/2019.     Activities of Daily Living In your present state of health, do you have any difficulty performing the following activities: 04/01/2019  Hearing? N  Vision? N  Difficulty concentrating or making decisions? N  Walking or climbing stairs? N  Dressing or bathing? N  Doing errands, shopping? N  Preparing Food and eating ? N  Using the Toilet? N  In the past six months, have you accidently leaked urine? N  Do you have problems with loss of bowel control? N  Managing your Medications? N  Managing your Finances? N  Housekeeping or managing your Housekeeping? N  Some recent data might be hidden    Patient Care Team: McLean-Scocuzza, Nino Glow, MD as PCP - General (Internal Medicine)    Assessment:   This is a routine wellness examination for Andrea Spencer.  Nurse connected with patient 04/01/19 at 10:30 AM EST by a telephone enabled telemedicine application and verified that I am speaking with the correct person using two  identifiers. Patient stated full name and DOB. Patient gave permission to continue with virtual visit. Patient's location was at home and Nurse's location was at [place] office.   Health Maintenance Due: See completed HM at the end of note.   Eye: Visual acuity not assessed. Virtual visit. Wears corrective lenses.  She plans to schedule.  Dental: Visits every 6 months.  Hearing: Demonstrates normal hearing during visit.  Safety:  Patient feels safe at home- yes Patient does have smoke detectors at home- yes Patient does wear sunscreen or protective clothing when in direct sunlight - yes Patient does wear seat belt when in a moving vehicle - yes Patient drives- yes Adequate lighting in walkways free from debris- yes Grab bars and handrails used as appropriate- yes Ambulates with no assistive device Cell phone or lifeline/life alert/medic alert on person when ambulating outside of the home- yes  Social: Alcohol intake - yes      Smoking history- never   Smokers in home? none Illicit drug use? none  Depression: PHQ 2 &9 complete. See screening below. Denies irritability, anhedonia, sadness/tearfullness.  Stable.   Falls: See screening below.    Medication: Taking as directed and without issues. Refill request for lipitor.   Covid-19: Precautions and sickness symptoms discussed. Wears mask, social distancing, hand hygiene as appropriate.   Activities of Daily Living Patient denies needing assistance with: household chores, feeding themselves, getting from bed to chair, getting to the toilet, bathing/showering, dressing, managing money, or preparing meals.   Memory: Patient is alert. Patient denies difficulty focusing or concentrating. Correctly identified the president of the [place], season and recall. Patient likes to read for brain stimulation.   BMI- discussed the importance of a healthy diet, water intake and the benefits of aerobic exercise.  Educational material  provided.  Physical activity- walks her dog, stretching  Diet:  Regular Water: good intake  Other Providers Patient Care Team: McLean-Scocuzza, Nino Glow, MD as PCP - General (Internal Medicine)  Exercise Activities and Dietary recommendations Current Exercise Habits: Home exercise routine, Type of exercise: stretching;walking, Time (Minutes): 30, Frequency (Times/Week): 4, Weekly Exercise (Minutes/Week): 120, Intensity: Moderate  Goals    . Increase physical activity     Continue to walk for exercise       Fall Risk Fall Risk  04/01/2019 03/23/2018 01/01/2018  Falls in the past year? 0 0 No  Follow up Education provided;Falls prevention discussed - -   Timed Get Up and Go performed: no, virtual visit  Depression Screen PHQ 2/9 Scores 04/01/2019 03/23/2018 01/01/2018  PHQ - 2 Score 0 0 0     Cognitive Function MMSE - Mini Mental State Exam 03/23/2018  Orientation to time 5  Orientation to Place 5  Registration 3  Attention/ Calculation 5  Recall 3  Language- name 2 objects 2  Language- repeat 1  Language- follow 3 step command 3  Language- read & follow direction 1  Write a sentence 1  Copy design 1  Total score 30     6CIT Screen 04/01/2019  What Year? 0 points  What month? 0 points  What time? 0 points  Count back from 20 0 points  Months in reverse 0 points  Repeat phrase 0 points  Total Score 0    Immunization History  Administered Date(s) Administered  . Fluad Quad(high Dose 65+) 12/29/2018  . Influenza, High Dose Seasonal PF 12/26/2017  . Pneumococcal Conjugate-13 06/18/2013  . Pneumococcal Polysaccharide-23 12/26/2017  . Tdap 12/31/2018  . Zoster Recombinat (Shingrix) 12/26/2017, 05/15/2018   Screening Tests Health Maintenance  Topic Date Due  . Fecal DNA (Cologuard)  05/13/2020  . MAMMOGRAM  05/28/2020  . TETANUS/TDAP  12/30/2028  . INFLUENZA VACCINE  Completed  . DEXA SCAN  Completed  . Hepatitis C Screening  Completed  . PNA vac Low Risk  Adult  Completed  Plan:   Keep all routine maintenance appointments.   Medicare Attestation I have personally reviewed: The patient's medical and social history Their use of alcohol, tobacco or illicit drugs Their current medications and supplements The patient's functional ability including ADLs,fall risks, home safety risks, cognitive, and hearing and visual impairment Diet and physical activities Evidence for depression   In addition, I have reviewed and discussed with patient certain preventive protocols, quality metrics, and best practice recommendations. A written personalized care plan for preventive services as well as general preventive health recommendations were provided to patient via mail.     Varney Biles, LPN  [phone]    Agree with above   [place]

## 2019-04-09 ENCOUNTER — Encounter: Payer: Self-pay | Admitting: Internal Medicine

## 2019-05-12 ENCOUNTER — Encounter: Payer: Self-pay | Admitting: Internal Medicine

## 2019-05-20 ENCOUNTER — Encounter: Payer: Self-pay | Admitting: Internal Medicine

## 2019-05-24 ENCOUNTER — Ambulatory Visit: Payer: PPO | Attending: Internal Medicine

## 2019-05-24 DIAGNOSIS — Z23 Encounter for immunization: Secondary | ICD-10-CM | POA: Insufficient documentation

## 2019-05-24 NOTE — Progress Notes (Signed)
   Covid-19 Vaccination Clinic  Name:  Andrea Spencer    MRN: [id number] DOB: 1945/05/04  05/24/2019  Andrea Spencer was observed post Covid-19 immunization for 15 minutes without incidence. She was provided with Vaccine Information Sheet and instruction to access the V-Safe system.   Andrea Spencer was instructed to call 911 with any severe reactions post vaccine: Marland Kitchen Difficulty breathing  . Swelling of your face and throat  . A fast heartbeat  . A bad rash all over your body  . Dizziness and weakness    Immunizations Administered    Name Date Dose VIS Date Route   Pfizer COVID-19 Vaccine 05/24/2019  1:35 PM 0.3 mL 04/09/2019 Intramuscular   Manufacturer: [place]   Lot: [id number]   [place]: [id number]

## 2019-06-14 ENCOUNTER — Ambulatory Visit: Payer: PPO | Attending: Internal Medicine

## 2019-06-14 DIAGNOSIS — Z23 Encounter for immunization: Secondary | ICD-10-CM | POA: Insufficient documentation

## 2019-06-14 NOTE — Progress Notes (Signed)
   Covid-19 Vaccination Clinic  Name:  Andrea Spencer    MRN: [id number] DOB: Oct 01, 1945  06/14/2019  Andrea Spencer was observed post Covid-19 immunization for 15 minutes without incidence. She was provided with Vaccine Information Sheet and instruction to access the V-Safe system.   Andrea Spencer was instructed to call 911 with any severe reactions post vaccine: Marland Kitchen Difficulty breathing  . Swelling of your face and throat  . A fast heartbeat  . A bad rash all over your body  . Dizziness and weakness    Immunizations Administered    Name Date Dose VIS Date Route   Pfizer COVID-19 Vaccine 06/14/2019 11:04 AM 0.3 mL 04/09/2019 Intramuscular   Manufacturer: [place]   Lot: [id number]   [place]: [id number]

## 2019-06-28 ENCOUNTER — Other Ambulatory Visit: Payer: Self-pay | Admitting: Internal Medicine

## 2019-06-28 DIAGNOSIS — Z1231 Encounter for screening mammogram for malignant neoplasm of breast: Secondary | ICD-10-CM

## 2019-07-08 DIAGNOSIS — L820 Inflamed seborrheic keratosis: Secondary | ICD-10-CM | POA: Diagnosis not present

## 2019-07-08 DIAGNOSIS — Z85828 Personal history of other malignant neoplasm of skin: Secondary | ICD-10-CM | POA: Diagnosis not present

## 2019-07-08 DIAGNOSIS — D2272 Melanocytic nevi of left lower limb, including hip: Secondary | ICD-10-CM | POA: Diagnosis not present

## 2019-07-08 DIAGNOSIS — D1801 Hemangioma of skin and subcutaneous tissue: Secondary | ICD-10-CM | POA: Diagnosis not present

## 2019-07-08 DIAGNOSIS — D225 Melanocytic nevi of trunk: Secondary | ICD-10-CM | POA: Diagnosis not present

## 2019-07-08 DIAGNOSIS — Z1283 Encounter for screening for malignant neoplasm of skin: Secondary | ICD-10-CM | POA: Diagnosis not present

## 2019-07-08 DIAGNOSIS — D2372 Other benign neoplasm of skin of left lower limb, including hip: Secondary | ICD-10-CM | POA: Diagnosis not present

## 2019-07-08 DIAGNOSIS — L578 Other skin changes due to chronic exposure to nonionizing radiation: Secondary | ICD-10-CM | POA: Diagnosis not present

## 2019-07-08 DIAGNOSIS — L821 Other seborrheic keratosis: Secondary | ICD-10-CM | POA: Diagnosis not present

## 2019-07-08 DIAGNOSIS — D2271 Melanocytic nevi of right lower limb, including hip: Secondary | ICD-10-CM | POA: Diagnosis not present

## 2019-07-29 ENCOUNTER — Other Ambulatory Visit: Payer: Self-pay

## 2019-07-29 ENCOUNTER — Ambulatory Visit
Admission: RE | Admit: 2019-07-29 | Discharge: 2019-07-29 | Disposition: A | Discharge status: Home / Self Care | Payer: PPO | Source: Ambulatory Visit | Attending: Internal Medicine | Admitting: Internal Medicine

## 2019-07-29 DIAGNOSIS — Z1231 Encounter for screening mammogram for malignant neoplasm of breast: Secondary | ICD-10-CM | POA: Diagnosis not present

## 2019-08-03 ENCOUNTER — Encounter: Payer: Self-pay | Admitting: Internal Medicine

## 2019-08-19 ENCOUNTER — Ambulatory Visit (INDEPENDENT_AMBULATORY_CARE_PROVIDER_SITE_OTHER): Payer: PPO | Admitting: Internal Medicine

## 2019-08-19 ENCOUNTER — Encounter: Payer: Self-pay | Admitting: Internal Medicine

## 2019-08-19 ENCOUNTER — Other Ambulatory Visit: Payer: Self-pay

## 2019-08-19 VITALS — BP 135/72 | HR 70 | Temp 97.5°F | Ht 60.75 in | Wt 135.6 lb

## 2019-08-19 DIAGNOSIS — M5416 Radiculopathy, lumbar region: Secondary | ICD-10-CM

## 2019-08-19 DIAGNOSIS — E559 Vitamin D deficiency, unspecified: Secondary | ICD-10-CM

## 2019-08-19 DIAGNOSIS — M25551 Pain in right hip: Secondary | ICD-10-CM | POA: Diagnosis not present

## 2019-08-19 DIAGNOSIS — Z1389 Encounter for screening for other disorder: Secondary | ICD-10-CM | POA: Diagnosis not present

## 2019-08-19 DIAGNOSIS — Z0000 Encounter for general adult medical examination without abnormal findings: Secondary | ICD-10-CM

## 2019-08-19 DIAGNOSIS — M25511 Pain in right shoulder: Secondary | ICD-10-CM | POA: Diagnosis not present

## 2019-08-19 DIAGNOSIS — M19049 Primary osteoarthritis, unspecified hand: Secondary | ICD-10-CM | POA: Diagnosis not present

## 2019-08-19 DIAGNOSIS — Z1329 Encounter for screening for other suspected endocrine disorder: Secondary | ICD-10-CM

## 2019-08-19 DIAGNOSIS — R739 Hyperglycemia, unspecified: Secondary | ICD-10-CM | POA: Diagnosis not present

## 2019-08-19 DIAGNOSIS — E785 Hyperlipidemia, unspecified: Secondary | ICD-10-CM

## 2019-08-19 DIAGNOSIS — G8929 Other chronic pain: Secondary | ICD-10-CM

## 2019-08-19 NOTE — Progress Notes (Signed)
Chief Complaint  Patient presents with  . Back Pain    Onset of 2-3 weeks ago. Back pain comes and goes, is like a stabbing pain in the lower back. No pain first in the morning, then some pain around lunch time, and then a lot of pain at night. Has tried Tylenol Arthritis but not helping.   . Hip Pain    Right hip pain at times. Sometimes pt states this will lock up and she has to stretch to be able to stand. Pt states she thinks it may be arthritis. 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    F/u  1. C/o right low back and right hip pain x 2-3 weeks. Tried a heating pad. Right back pain rad to right hip and at times back catches 0/10 pain today 2. C/o right shoulder pain  3. C/o arthritis to hands and FH arthritis s/p left TKR For all of the above has tried Tylenol arthritis   Review of Systems  Constitutional: Negative for weight loss.  HENT: Negative for hearing loss.   Eyes: Negative for blurred vision.  Respiratory: Negative for shortness of breath.   Cardiovascular: Negative for chest pain.  Gastrointestinal: Negative for abdominal pain.  Musculoskeletal: Positive for back pain and joint pain. Negative for falls.  Skin: Negative for rash.  Neurological: Negative for headaches.  Psychiatric/Behavioral: Negative for depression.   Past Medical History:  Diagnosis Date  . Arthritis   . Breast mass    right  . Cancer ([place])    skin cancer SCC, BCC multiple   . Headache   . History of bronchitis   . History of pneumonia   . Hyperlipidemia    Past Surgical History:  Procedure Laterality Date  . BREAST LUMPECTOMY     right 2016 negative malignancy fibrocystic changes   . COLONOSCOPY    . DILATION AND CURETTAGE OF UTERUS    . JOINT REPLACEMENT     left   . MOHS SURGERY     x2 face   . MOHS SURGERY     L alar 02/04/18   . RADIOACTIVE SEED GUIDED EXCISIONAL BREAST BIOPSY Right 04/06/2015   Procedure: RADIOACTIVE SEED GUIDED EXCISIONAL RIGHT BREAST BIOPSY;  Surgeon: Stark Klein, MD;  Location: [facility];  Service: General;  Laterality: Right;  . 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 WISDOM TOOTH EXTRACTION     Family History  Problem Relation Age of Onset  . Stroke Mother   . Alzheimer's disease Father    Social History   Socioeconomic History  . Marital status: Widowed    Spouse name: Not on file  . Number of children: Not on file  . Years of education: Not on file  . Highest education level: Not on file  Occupational History  . Not on file  Tobacco Use  . Smoking status: Never Smoker  . Smokeless tobacco: Never Used  Substance and Sexual Activity  . Alcohol use: Yes    Comment: social  . Drug use: No  . Sexual activity: Not Currently  Other Topics Concern  . 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 Difficulty of Paying Living Expenses:   Food Insecurity:   . Worried About Charity fundraiser in the Last Year:   . Arboriculturist in the Last Year:   Transportation Needs:   . Film/video editor (Medical):   Marland Kitchen Lack of Transportation (Non-Medical):   Physical Activity: Insufficiently Active  . Days of Exercise per Week: 4 days  . Minutes of Exercise per Session: 30 min  Stress:   . Feeling of Stress :   Social Connections:   . Frequency of Communication with Friends and Family:   . Frequency of Social Gatherings with Friends and Family:   . Attends Religious Services:   . Active Member of Clubs or Organizations:   . Attends Archivist Meetings:   Marland Kitchen Marital Status:    Intimate Partner Violence:   . Fear of Current or Ex-Partner:   . Emotionally Abused:   Marland Kitchen Physically Abused:   . Sexually Abused:    Current Meds  Medication Sig  . Acetaminophen (TYLENOL ARTHRITIS PAIN PO) Take by mouth.  . Apoaequorin (PREVAGEN EXTRA STRENGTH PO) Take by mouth.  Marland Kitchen aspirin 81 MG tablet Take 81 mg by mouth daily.  Marland Kitchen atorvastatin (LIPITOR) 10 MG tablet Take 1 tablet (10 mg total) by mouth daily at 6 PM.  . Biotin 10 MG CAPS Take 1,000 mg by mouth daily.   . Calcium Carb-Cholecalciferol (CVS CALCIUM 600 & VITAMIN D3 PO) Take by mouth 2 (two) times daily after a meal.  . calcium carbonate (OS-CAL) 1250 (500 CA) MG chewable tablet Chew 1 tablet by mouth daily.  . cholecalciferol (VITAMIN D) 1000 UNITS tablet Take 1,000 Units by mouth daily.  Javier Docker Oil 1000 MG CAPS Take 3 capsules by mouth daily.   . Multiple Vitamin (MULTIVITAMIN WITH MINERALS) TABS tablet Take 1 tablet by mouth daily.  . vitamin C (ASCORBIC ACID) 250 MG tablet Take 500 mg by mouth daily. Taking two tablets daily   Allergies  Allergen Reactions  . Aspartame And Phenylalanine   . Meloxicam Hives and Diarrhea  . Other Itching    Sunscreens Lotions and Tanning products   No results found for this or any previous visit (from the past 2160 hour(s)). Objective  Body mass index is 25.83 kg/m. Wt Readings from Last 3 Encounters:  08/19/19 135 lb 9.6 oz (61.5 kg)  07/13/18 141 lb 3.2 oz (64 kg)  05/05/18 141 lb (64 kg)   Temp Readings from Last 3 Encounters:  08/19/19 (!) 97.5 F (36.4 C) (Temporal)  07/13/18 98.2 F (36.8 C) (Oral)  05/05/18 98.6 F (37 C) (Oral)   BP Readings from Last 3 Encounters:  08/19/19 135/72  07/13/18 112/80  05/05/18 122/70   Pulse Readings from Last 3 Encounters:  08/19/19 70  07/13/18 80  05/05/18 78    Physical Exam Vitals and nursing note reviewed.  Constitutional:      Appearance: Normal appearance. She is well-developed and well-groomed.  HENT:      Head: Normocephalic and atraumatic.  Eyes:     Conjunctiva/sclera: Conjunctivae normal.     Pupils: Pupils are equal, round, and reactive to light.  Cardiovascular:     Rate and Rhythm: Normal rate and regular rhythm.     Heart sounds: Normal heart sounds. No murmur.  Pulmonary:     Effort: Pulmonary effort is normal.     Breath sounds: Normal breath sounds.  Skin:    General: Skin is warm and dry.  Neurological:     General: No focal deficit present.     Mental Status: She is alert and oriented to person, place, and time. Mental status is at baseline.     Gait: Gait normal.  Psychiatric:        Attention and Perception: Attention and perception normal.        Mood and Affect: Mood and affect normal.        Speech: Speech normal.        Behavior: Behavior normal. Behavior is cooperative.        Thought Content: Thought content normal.        Cognition and Memory: Cognition and memory normal.        Judgment: Judgment normal.     Assessment  Plan  Lumbar radiculopathy - Plan: Comprehensive metabolic panel, CBC w/Diff, DG Lumbar Spine Complete,   Chronic right shoulder pain - Plan: DG Shoulder Right, Comprehensive metabolic panel, CBC w/Diff  Right hip pain - Plan: DG HIP UNILAT WITH PELVIS 2-3 VIEWS RIGHT  Hand arthritis Prn Tylenol   Hyperlipidemia, unspecified hyperlipidemia type - Plan: Lipid panel  Vitamin D deficiency - Plan: VITAMIN D 25 Hydroxy (Vit-D Deficiency, Fractures)  Hyperglycemia - Plan: Hemoglobin A1c  Do fasting labs at f/u   HM Had flu shot utd shingrix 2/2  utdTdap  utd prevnar and pna 23  covid 2/2  Out of age window pap  07/29/19 neg mammo  DEXA 05/28/2018 osteopenia  cologuard 05/13/17 negative  H/o skin SCC, BCC multiple f/u Dr. Laurence Ferrari last seen fall 2020  mohs 3rd to nose 01/2018 Dr. Elvera Bicker [facility]  [facility] left alar nose mohs on 02/04/18 Dr. Manley Mason, derm f/u 05/2018  Provider: Dr. Olivia Mackie McLean-Scocuzza-Internal Medicine

## 2019-08-19 NOTE — Patient Instructions (Addendum)
Lidocaine pain patch  Voltaren gel over the counter  Consider nature wise/nature made tumeric/ginger/curcumen  Stay active   Shoulder Range of Motion Exercises Shoulder range of motion (ROM) exercises are done to keep the shoulder moving freely or to increase movement. They are often recommended for people who have shoulder pain or stiffness or who are recovering from a shoulder surgery. Phase 1 exercises When you are able, do this exercise 1-2 times per day for 30-60 seconds in each direction, or as directed by your health care provider. Pendulum exercise To do this exercise while sitting: 1. Sit in a chair or at the edge of your bed with your feet flat on the floor. 2. Let your affected arm hang down in front of you over the edge of the bed or chair. 3. Relax your shoulder, arm, and hand. Lynn your body so your arm gently swings in small circles. You can also use your unaffected arm to start the motion. 5. Repeat changing the direction of the circles, swinging your arm left and right, and swinging your arm forward and back. To do this exercise while standing: 1. Stand next to a sturdy chair or table, and hold on to it with your hand on your unaffected side. 2. Bend forward at the waist. 3. Bend your knees slightly. 4. Relax your shoulder, arm, and hand. 5. While keeping your shoulder relaxed, use body motion to swing your arm in small circles. 6. Repeat changing the direction of the circles, swinging your arm left and right, and swinging your arm forward and back. 7. Between exercises, stand up tall and take a short break to relax your lower back.  Phase 2 exercises Do these exercises 1-2 times per day or as told by your health care provider. Hold each stretch for 30 seconds, and repeat 3 times. Do the exercises with one or both arms as instructed by your health care provider. For these exercises, sit at a table with your hand and arm supported by the table. A chair that slides easily  or has wheels can be helpful. External rotation 1. Turn your chair so that your affected side is nearest to the table. 2. Place your forearm on the table to your side. Bend your elbow about 90 at the elbow (right angle) and place your hand palm facing down on the table. Your elbow should be about 6 inches away from your side. 3. Keeping your arm on the table, lean your body forward. Abduction 1. Turn your chair so that your affected side is nearest to the table. 2. Place your forearm and hand on the table so that your thumb points toward the ceiling and your arm is straight out to your side. 3. Slide your hand out to the side and away from you, using your unaffected arm to do the work. 4. To increase the stretch, you can slide your chair away from the table. Flexion: forward stretch 1. Sit facing the table. Place your hand and elbow on the table in front of you. 2. Slide your hand forward and away from you, using your unaffected arm to do the work. 3. To increase the stretch, you can slide your chair backward. Phase 3 exercises Do these exercises 1-2 times per day or as told by your health care provider. Hold each stretch for 30 seconds, and repeat 3 times. Do the exercises with one or both arms as instructed by your health care provider. Cross-body stretch: posterior capsule stretch 1. Lift your  arm straight out in front of you. 2. Bend your arm 90 at the elbow (right angle) so your forearm moves across your body. 3. Use your other arm to gently pull the elbow across your body, toward your other shoulder. Wall climbs 1. Stand with your affected arm extended out to the side with your hand resting on a door frame. 2. Slide your hand slowly up the door frame. 3. To increase the stretch, step through the door frame. Keep your body upright and do not lean. Wand exercises You will need a cane, a piece of PVC pipe, or a sturdy wooden dowel for wand exercises. Flexion To do this exercise while  standing: 1. Hold the wand with both of your hands, palms down. 2. Using the other arm to help, lift your arms up and over your head, if able. 3. Push upward with your other arm to gently increase the stretch. To do this exercise while lying down: 1. Lie on your back with your elbows resting on the floor and the wand in both your hands. Your hands will be palm down, or pointing toward your feet. 2. Lift your hands toward the ceiling, using your unaffected arm to help if needed. 3. Bring your arms overhead as able, using your unaffected arm to help if needed. Internal rotation 1. Stand while holding the wand behind you with both hands. Your unaffected arm should be extended above your head with the arm of the affected side extended behind you at the level of your waist. The wand should be pointing straight up and down as you hold it. 2. Slowly pull the wand up behind your back by straightening the elbow of your unaffected arm and bending the elbow of your affected arm. External rotation 1. Lie on your back with your affected upper arm supported on a small pillow or rolled towel. When you first do this exercise, keep your upper arm close to your body. Over time, bring your arm up to a 90 angle out to the side. 2. Hold the wand across your stomach and with both hands palm up. Your elbow on your affected side should be bent at a 90 angle. 3. Use your unaffected side to help push your forearm away from you and toward the floor. Keep your elbow on your affected side bent at a 90 angle. Contact a health care provider if you have:  New or increasing pain.  New numbness, tingling, weakness, or discoloration in your arm or hand. This information is not intended to replace advice given to you by your health care provider. Make sure you discuss any questions you have with your health care provider. Document Revised: 05/28/2017 Document Reviewed: 05/28/2017 Elsevier Patient Education  [place].  Shoulder Exercises Ask your health care provider which exercises are safe for you. Do exercises exactly as told by your health care provider and adjust them as directed. It is normal to feel mild stretching, pulling, tightness, or discomfort as you do these exercises. Stop right away if you feel sudden pain or your pain gets worse. Do not begin these exercises until told by your health care provider. Stretching exercises External rotation and abduction This exercise is sometimes called corner stretch. This exercise rotates your arm outward (external rotation) and moves your arm out from your body (abduction). 1. Stand in a doorway with one of your feet slightly in front of the other. This is called a staggered stance. If you cannot reach your forearms to  the door frame, stand facing a corner of a room. 2. Choose one of the following positions as told by your health care provider: ? Place your hands and forearms on the door frame above your head. ? Place your hands and forearms on the door frame at the height of your head. ? Place your hands on the door frame at the height of your elbows. 3. Slowly move your weight onto your front foot until you feel a stretch across your chest and in the front of your shoulders. Keep your head and chest upright and keep your abdominal muscles tight. 4. Hold for __________ seconds. 5. To release the stretch, shift your weight to your back foot. Repeat __________ times. Complete this exercise __________ times a day. Extension, standing 1. Stand and hold a broomstick, a cane, or a similar object behind your back. ? Your hands should be a little wider than shoulder width apart. ? Your palms should face away from your back. 2. Keeping your elbows straight and your shoulder muscles relaxed, move the stick away from your body until you feel a stretch in your shoulders (extension). ? Avoid shrugging your shoulders while you move the stick. Keep your shoulder  blades tucked down toward the middle of your back. 3. Hold for __________ seconds. 4. Slowly return to the starting position. Repeat __________ times. Complete this exercise __________ times a day. Range-of-motion exercises Pendulum  1. Stand near a wall or a surface that you can hold onto for balance. 2. Bend at the waist and let your left / right arm hang straight down. Use your other arm to support you. Keep your back straight and do not lock your knees. 3. Relax your left / right arm and shoulder muscles, and move your hips and your trunk so your left / right arm swings freely. Your arm should swing because of the motion of your body, not because you are using your arm or shoulder muscles. 4. Keep moving your hips and trunk so your arm swings in the following directions, as told by your health care provider: ? Side to side. ? Forward and backward. ? In clockwise and counterclockwise circles. 5. Continue each motion for __________ seconds, or for as long as told by your health care provider. 6. Slowly return to the starting position. Repeat __________ times. Complete this exercise __________ times a day. Shoulder flexion, standing  1. Stand and hold a broomstick, a cane, or a similar object. Place your hands a little more than shoulder width apart on the object. Your left / right hand should be palm up, and your other hand should be palm down. 2. Keep your elbow straight and your shoulder muscles relaxed. Push the stick up with your healthy arm to raise your left / right arm in front of your body, and then over your head until you feel a stretch in your shoulder (flexion). ? Avoid shrugging your shoulder while you raise your arm. Keep your shoulder blade tucked down toward the middle of your back. 3. Hold for __________ seconds. 4. Slowly return to the starting position. Repeat __________ times. Complete this exercise __________ times a day. Shoulder abduction, standing 1. Stand and hold  a broomstick, a cane, or a similar object. Place your hands a little more than shoulder width apart on the object. Your left / right hand should be palm up, and your other hand should be palm down. 2. Keep your elbow straight and your shoulder muscles relaxed. Push the object  across your body toward your left / right side. Raise your left / right arm to the side of your body (abduction) until you feel a stretch in your shoulder. ? Do not raise your arm above shoulder height unless your health care provider tells you to do that. ? If directed, raise your arm over your head. ? Avoid shrugging your shoulder while you raise your arm. Keep your shoulder blade tucked down toward the middle of your back. 3. Hold for __________ seconds. 4. Slowly return to the starting position. Repeat __________ times. Complete this exercise __________ times a day. Internal rotation  1. Place your left / right hand behind your back, palm up. 2. Use your other hand to dangle an exercise band, a towel, or a similar object over your shoulder. Grasp the band with your left / right hand so you are holding on to both ends. 3. Gently pull up on the band until you feel a stretch in the front of your left / right shoulder. The movement of your arm toward the center of your body is called internal rotation. ? Avoid shrugging your shoulder while you raise your arm. Keep your shoulder blade tucked down toward the middle of your back. 4. Hold for __________ seconds. 5. Release the stretch by letting go of the band and lowering your hands. Repeat __________ times. Complete this exercise __________ times a day. Strengthening exercises External rotation  1. Sit in a stable chair without armrests. 2. Secure an exercise band to a stable object at elbow height on your left / right side. 3. Place a soft object, such as a folded towel or a small pillow, between your left / right upper arm and your body to move your elbow about 4 inches  (10 cm) away from your side. 4. Hold the end of the exercise band so it is tight and there is no slack. 5. Keeping your elbow pressed against the soft object, slowly move your forearm out, away from your abdomen (external rotation). Keep your body steady so only your forearm moves. 6. Hold for __________ seconds. 7. Slowly return to the starting position. Repeat __________ times. Complete this exercise __________ times a day. Shoulder abduction  1. Sit in a stable chair without armrests, or stand up. 2. Hold a __________ weight in your left / right hand, or hold an exercise band with both hands. 3. Start with your arms straight down and your left / right palm facing in, toward your body. 4. Slowly lift your left / right hand out to your side (abduction). Do not lift your hand above shoulder height unless your health care provider tells you that this is safe. ? Keep your arms straight. ? Avoid shrugging your shoulder while you do this movement. Keep your shoulder blade tucked down toward the middle of your back. 5. Hold for __________ seconds. 6. Slowly lower your arm, and return to the starting position. Repeat __________ times. Complete this exercise __________ times a day. Shoulder extension 1. Sit in a stable chair without armrests, or stand up. 2. Secure an exercise band to a stable object in front of you so it is at shoulder height. 3. Hold one end of the exercise band in each hand. Your palms should face each other. 4. Straighten your elbows and lift your hands up to shoulder height. 5. Step back, away from the secured end of the exercise band, until the band is tight and there is no slack. 6. Squeeze your shoulder  blades together as you pull your hands down to the sides of your thighs (extension). Stop when your hands are straight down by your sides. Do not let your hands go behind your body. 7. Hold for __________ seconds. 8. Slowly return to the starting position. Repeat __________  times. Complete this exercise __________ times a day. Shoulder row 1. Sit in a stable chair without armrests, or stand up. 2. Secure an exercise band to a stable object in front of you so it is at waist height. 3. Hold one end of the exercise band in each hand. Position your palms so that your thumbs are facing the ceiling (neutral position). 4. Bend each of your elbows to a 90-degree angle (right angle) and keep your upper arms at your sides. 5. Step back until the band is tight and there is no slack. 6. Slowly pull your elbows back behind you. 7. Hold for __________ seconds. 8. Slowly return to the starting position. Repeat __________ times. Complete this exercise __________ times a day. Shoulder press-ups  1. Sit in a stable chair that has armrests. Sit upright, with your feet flat on the floor. 2. Put your hands on the armrests so your elbows are bent and your fingers are pointing forward. Your hands should be about even with the sides of your body. 3. Push down on the armrests and use your arms to lift yourself off the chair. Straighten your elbows and lift yourself up as much as you comfortably can. ? Move your shoulder blades down, and avoid letting your shoulders move up toward your ears. ? Keep your feet on the ground. As you get stronger, your feet should support less of your body weight as you lift yourself up. 4. Hold for __________ seconds. 5. Slowly lower yourself back into the chair. Repeat __________ times. Complete this exercise __________ times a day. Wall push-ups  1. Stand so you are facing a stable wall. Your feet should be about one arm-length away from the wall. 2. Lean forward and place your palms on the wall at shoulder height. 3. Keep your feet flat on the floor as you bend your elbows and lean forward toward the wall. 4. Hold for __________ seconds. 5. Straighten your elbows to push yourself back to the starting position. Repeat __________ times. Complete this  exercise __________ times a day. This information is not intended to replace advice given to you by your health care provider. Make sure you discuss any questions you have with your health care provider. Document Revised: 08/07/2018 Document Reviewed: 05/15/2018 Elsevier Patient Education  [place] or Strain Rehab Ask your health care provider which exercises are safe for you. Do exercises exactly as told by your health care provider and adjust them as directed. It is normal to feel mild stretching, pulling, tightness, or discomfort as you do these exercises. Stop right away if you feel sudden pain or your pain gets worse. Do not begin these exercises until told by your health care provider. Stretching and range-of-motion exercises These exercises warm up your muscles and joints and improve the movement and flexibility of your back. These exercises also help to relieve pain, numbness, and tingling. Lumbar rotation  6. Lie on your back on a firm surface and bend your knees. 7. Straighten your arms out to your sides so each arm forms a 90-degree angle (right angle) with a side of your body. 8. Slowly move (rotate) both of your knees to one side  of your body until you feel a stretch in your lower back (lumbar). Try not to let your shoulders lift off the floor. 9. Hold this position for __________ seconds. 10. Tense your abdominal muscles and slowly move your knees back to the starting position. 11. Repeat this exercise on the other side of your body. Repeat __________ times. Complete this exercise __________ times a day. Single knee to chest  5. Lie on your back on a firm surface with both legs straight. 6. Bend one of your knees. Use your hands to move your knee up toward your chest until you feel a gentle stretch in your lower back and buttock. ? Hold your leg in this position by holding on to the front of your knee. ? Keep your other leg as straight as  possible. 7. Hold this position for __________ seconds. 8. Slowly return to the starting position. 9. Repeat with your other leg. Repeat __________ times. Complete this exercise __________ times a day. Prone extension on elbows  7. Lie on your abdomen on a firm surface (prone position). 8. Prop yourself up on your elbows. 9. Use your arms to help lift your chest up until you feel a gentle stretch in your abdomen and your lower back. ? This will place some of your body weight on your elbows. If this is uncomfortable, try stacking pillows under your chest. ? Your hips should stay down, against the surface that you are lying on. Keep your hip and back muscles relaxed. 10. Hold this position for __________ seconds. 11. Slowly relax your upper body and return to the starting position. Repeat __________ times. Complete this exercise __________ times a day. Strengthening exercises These exercises build strength and endurance in your back. Endurance is the ability to use your muscles for a long time, even after they get tired. Pelvic tilt This exercise strengthens the muscles that lie deep in the abdomen. 5. Lie on your back on a firm surface. Bend your knees and keep your feet flat on the floor. 6. Tense your abdominal muscles. Tip your pelvis up toward the ceiling and flatten your lower back into the floor. ? To help with this exercise, you may place a small towel under your lower back and try to push your back into the towel. 7. Hold this position for __________ seconds. 8. Let your muscles relax completely before you repeat this exercise. Repeat __________ times. Complete this exercise __________ times a day. Alternating arm and leg raises  5. Get on your hands and knees on a firm surface. If you are on a hard floor, you may want to use padding, such as an exercise mat, to cushion your knees. 6. Line up your arms and legs. Your hands should be directly below your shoulders, and your knees  should be directly below your hips. 7. Lift your left leg behind you. At the same time, raise your right arm and straighten it in front of you. ? Do not lift your leg higher than your hip. ? Do not lift your arm higher than your shoulder. ? Keep your abdominal and back muscles tight. ? Keep your hips facing the ground. ? Do not arch your back. ? Keep your balance carefully, and do not hold your breath. 8. Hold this position for __________ seconds. 9. Slowly return to the starting position. 10. Repeat with your right leg and your left arm. Repeat __________ times. Complete this exercise __________ times a day. Abdominal set with straight leg raise  6. Lie on your back on a firm surface. 7. Bend one of your knees and keep your other leg straight. 8. Tense your abdominal muscles and lift your straight leg up, 4-6 inches (10-15 cm) off the ground. 9. Keep your abdominal muscles tight and hold this position for __________ seconds. ? Do not hold your breath. ? Do not arch your back. Keep it flat against the ground. 10. Keep your abdominal muscles tense as you slowly lower your leg back to the starting position. 11. Repeat with your other leg. Repeat __________ times. Complete this exercise __________ times a day. Single leg lower with bent knees 8. Lie on your back on a firm surface. 9. Tense your abdominal muscles and lift your feet off the floor, one foot at a time, so your knees and hips are bent in 90-degree angles (right angles). ? Your knees should be over your hips and your lower legs should be parallel to the floor. 10. Keeping your abdominal muscles tense and your knee bent, slowly lower one of your legs so your toe touches the ground. 11. Lift your leg back up to return to the starting position. ? Do not hold your breath. ? Do not let your back arch. Keep your back flat against the ground. 12. Repeat with your other leg. Repeat __________ times. Complete this exercise __________  times a day. Posture and body mechanics Good posture and healthy body mechanics can help to relieve stress in your body's tissues and joints. Body mechanics refers to the movements and positions of your body while you do your daily activities. Posture is part of body mechanics. Good posture means:  Your spine is in its natural S-curve position (neutral).  Your shoulders are pulled back slightly.  Your head is not tipped forward. Follow these guidelines to improve your posture and body mechanics in your everyday activities. Standing   When standing, keep your spine neutral and your feet about hip width apart. Keep a slight bend in your knees. Your ears, shoulders, and hips should line up.  When you do a task in which you stand in one place for a long time, place one foot up on a stable object that is 2-4 inches (5-10 cm) high, such as a footstool. This helps keep your spine neutral. Sitting   When sitting, keep your spine neutral and keep your feet flat on the floor. Use a footrest, if necessary, and keep your thighs parallel to the floor. Avoid rounding your shoulders, and avoid tilting your head forward.  When working at a desk or a computer, keep your desk at a height where your hands are slightly lower than your elbows. Slide your chair under your desk so you are close enough to maintain good posture.  When working at a computer, place your monitor at a height where you are looking straight ahead and you do not have to tilt your head forward or downward to look at the screen. Resting  When lying down and resting, avoid positions that are most painful for you.  If you have pain with activities such as sitting, bending, stooping, or squatting, lie in a position in which your body does not bend very much. For example, avoid curling up on your side with your arms and knees near your chest (fetal position).  If you have pain with activities such as standing for a long time or reaching  with your arms, lie with your spine in a neutral position and bend your knees  slightly. Try the following positions: ? Lying on your side with a pillow between your knees. ? Lying on your back with a pillow under your knees. Lifting   When lifting objects, keep your feet at least shoulder width apart and tighten your abdominal muscles.  Bend your knees and hips and keep your spine neutral. It is important to lift using the strength of your legs, not your back. Do not lock your knees straight out.  Always ask for help to lift heavy or awkward objects. This information is not intended to replace advice given to you by your health care provider. Make sure you discuss any questions you have with your health care provider. Document Revised: 08/07/2018 Document Reviewed: 05/07/2018 Elsevier Patient Education  [place].  Back Exercises The following exercises strengthen the muscles that help to support the trunk and back. They also help to keep the lower back flexible. Doing these exercises can help to prevent back pain or lessen existing pain.  If you have back pain or discomfort, try doing these exercises 2-3 times each day or as told by your health care provider.  As your pain improves, do them once each day, but increase the number of times that you repeat the steps for each exercise (do more repetitions).  To prevent the recurrence of back pain, continue to do these exercises once each day or as told by your health care provider. Do exercises exactly as told by your health care provider and adjust them as directed. It is normal to feel mild stretching, pulling, tightness, or discomfort as you do these exercises, but you should stop right away if you feel sudden pain or your pain gets worse. Exercises Single knee to chest Repeat these steps 3-5 times for each leg: 1. Lie on your back on a firm bed or the floor with your legs extended. 2. Bring one knee to your chest. Your other leg  should stay extended and in contact with the floor. 3. Hold your knee in place by grabbing your knee or thigh with both hands and hold. 4. Pull on your knee until you feel a gentle stretch in your lower back or buttocks. 5. Hold the stretch for 10-30 seconds. 6. Slowly release and straighten your leg. Pelvic tilt Repeat these steps 5-10 times: 1. Lie on your back on a firm bed or the floor with your legs extended. 2. Bend your knees so they are pointing toward the ceiling and your feet are flat on the floor. 3. Tighten your lower abdominal muscles to press your lower back against the floor. This motion will tilt your pelvis so your tailbone points up toward the ceiling instead of pointing to your feet or the floor. 4. With gentle tension and even breathing, hold this position for 5-10 seconds. Cat-cow Repeat these steps until your lower back becomes more flexible: 1. Get into a hands-and-knees position on a firm surface. Keep your hands under your shoulders, and keep your knees under your hips. You may place padding under your knees for comfort. 2. Let your head hang down toward your chest. Contract your abdominal muscles and point your tailbone toward the floor so your lower back becomes rounded like the back of a cat. 3. Hold this position for 5 seconds. 4. Slowly lift your head, let your abdominal muscles relax and point your tailbone up toward the ceiling so your back forms a sagging arch like the back of a cow. 5. Hold this position for 5  seconds.  Press-ups Repeat these steps 5-10 times: 1. Lie on your abdomen (face-down) on the floor. 2. Place your palms near your head, about shoulder-width apart. 3. Keeping your back as relaxed as possible and keeping your hips on the floor, slowly straighten your arms to raise the top half of your body and lift your shoulders. Do not use your back muscles to raise your upper torso. You may adjust the placement of your hands to make yourself more  comfortable. 4. Hold this position for 5 seconds while you keep your back relaxed. 5. Slowly return to lying flat on the floor.  Bridges Repeat these steps 10 times: 1. Lie on your back on a firm surface. 2. Bend your knees so they are pointing toward the ceiling and your feet are flat on the floor. Your arms should be flat at your sides, next to your body. 3. Tighten your buttocks muscles and lift your buttocks off the floor until your waist is at almost the same height as your knees. You should feel the muscles working in your buttocks and the back of your thighs. If you do not feel these muscles, slide your feet 1-2 inches farther away from your buttocks. 4. Hold this position for 3-5 seconds. 5. Slowly lower your hips to the starting position, and allow your buttocks muscles to relax completely. If this exercise is too easy, try doing it with your arms crossed over your chest. Abdominal crunches Repeat these steps 5-10 times: 1. Lie on your back on a firm bed or the floor with your legs extended. 2. Bend your knees so they are pointing toward the ceiling and your feet are flat on the floor. 3. Cross your arms over your chest. 4. Tip your chin slightly toward your chest without bending your neck. 5. Tighten your abdominal muscles and slowly raise your trunk (torso) high enough to lift your shoulder blades a tiny bit off the floor. Avoid raising your torso higher than that because it can put too much stress on your low back and does not help to strengthen your abdominal muscles. 6. Slowly return to your starting position. Back lifts Repeat these steps 5-10 times: 1. Lie on your abdomen (face-down) with your arms at your sides, and rest your forehead on the floor. 2. Tighten the muscles in your legs and your buttocks. 3. Slowly lift your chest off the floor while you keep your hips pressed to the floor. Keep the back of your head in line with the curve in your back. Your eyes should be  looking at the floor. 4. Hold this position for 3-5 seconds. 5. Slowly return to your starting position. Contact a health care provider if:  Your back pain or discomfort gets much worse when you do an exercise.  Your worsening back pain or discomfort does not lessen within 2 hours after you exercise. If you have any of these problems, stop doing these exercises right away. Do not do them again unless your health care provider says that you can. Get help right away if:  You develop sudden, severe back pain. If this happens, stop doing the exercises right away. Do not do them again unless your health care provider says that you can. This information is not intended to replace advice given to you by your health care provider. Make sure you discuss any questions you have with your health care provider. Document Revised: 08/20/2018 Document Reviewed: 01/15/2018 [employer] Patient Education  [place].

## 2019-08-20 ENCOUNTER — Ambulatory Visit (INDEPENDENT_AMBULATORY_CARE_PROVIDER_SITE_OTHER): Payer: PPO

## 2019-08-20 ENCOUNTER — Other Ambulatory Visit: Payer: PPO

## 2019-08-20 DIAGNOSIS — M25551 Pain in right hip: Secondary | ICD-10-CM

## 2019-08-20 DIAGNOSIS — E785 Hyperlipidemia, unspecified: Secondary | ICD-10-CM

## 2019-08-20 DIAGNOSIS — M25511 Pain in right shoulder: Secondary | ICD-10-CM

## 2019-08-20 DIAGNOSIS — Z1389 Encounter for screening for other disorder: Secondary | ICD-10-CM

## 2019-08-20 DIAGNOSIS — G8929 Other chronic pain: Secondary | ICD-10-CM

## 2019-08-20 DIAGNOSIS — M5416 Radiculopathy, lumbar region: Secondary | ICD-10-CM

## 2019-08-20 DIAGNOSIS — R739 Hyperglycemia, unspecified: Secondary | ICD-10-CM

## 2019-08-20 DIAGNOSIS — Z1329 Encounter for screening for other suspected endocrine disorder: Secondary | ICD-10-CM

## 2019-08-20 DIAGNOSIS — E559 Vitamin D deficiency, unspecified: Secondary | ICD-10-CM

## 2019-08-20 DIAGNOSIS — Z0000 Encounter for general adult medical examination without abnormal findings: Secondary | ICD-10-CM

## 2019-08-20 DIAGNOSIS — M545 Low back pain: Secondary | ICD-10-CM | POA: Diagnosis not present

## 2019-08-20 NOTE — Addendum Note (Signed)
Addended by: Tor Netters I on: 08/20/2019 02:32 PM   Modules accepted: Orders

## 2019-09-09 ENCOUNTER — Other Ambulatory Visit (INDEPENDENT_AMBULATORY_CARE_PROVIDER_SITE_OTHER): Payer: PPO

## 2019-09-09 ENCOUNTER — Other Ambulatory Visit: Payer: Self-pay

## 2019-09-09 DIAGNOSIS — G8929 Other chronic pain: Secondary | ICD-10-CM | POA: Diagnosis not present

## 2019-09-09 DIAGNOSIS — Z1389 Encounter for screening for other disorder: Secondary | ICD-10-CM

## 2019-09-09 DIAGNOSIS — Z1329 Encounter for screening for other suspected endocrine disorder: Secondary | ICD-10-CM | POA: Diagnosis not present

## 2019-09-09 DIAGNOSIS — M25511 Pain in right shoulder: Secondary | ICD-10-CM | POA: Diagnosis not present

## 2019-09-09 DIAGNOSIS — E785 Hyperlipidemia, unspecified: Secondary | ICD-10-CM | POA: Diagnosis not present

## 2019-09-09 DIAGNOSIS — M5416 Radiculopathy, lumbar region: Secondary | ICD-10-CM

## 2019-09-09 DIAGNOSIS — Z0000 Encounter for general adult medical examination without abnormal findings: Secondary | ICD-10-CM | POA: Diagnosis not present

## 2019-09-09 DIAGNOSIS — R739 Hyperglycemia, unspecified: Secondary | ICD-10-CM

## 2019-09-09 DIAGNOSIS — E559 Vitamin D deficiency, unspecified: Secondary | ICD-10-CM | POA: Diagnosis not present

## 2019-09-09 LAB — LIPID PANEL
Cholesterol: 174 mg/dL (ref 0–200)
HDL: 68.3 mg/dL (ref >39.00)
LDL Cholesterol: 76 mg/dL (ref 0–99)
NonHDL: 105.2
Total CHOL/HDL Ratio: 3
Triglycerides: 144 mg/dL (ref 0.0–149.0)
VLDL: 28.8 mg/dL (ref 0.0–40.0)

## 2019-09-09 LAB — COMPREHENSIVE METABOLIC PANEL
ALT: 15 U/L (ref 0–35)
AST: 17 U/L (ref 0–37)
Albumin: 4.2 g/dL (ref 3.5–5.2)
Alkaline Phosphatase: 46 U/L (ref 39–117)
BUN: 15 mg/dL (ref 6–23)
CO2: 32 mEq/L (ref 19–32)
Calcium: 10.2 mg/dL (ref 8.4–10.5)
Chloride: 104 mEq/L (ref 96–112)
Creatinine, Ser: 0.76 mg/dL (ref 0.40–1.20)
GFR: 74.91 mL/min (ref >60.00)
Glucose, Bld: 82 mg/dL (ref 70–99)
Potassium: 4.2 mEq/L (ref 3.5–5.1)
Sodium: 140 mEq/L (ref 135–145)
Total Bilirubin: 1.5 mg/dL — ABNORMAL HIGH (ref 0.2–1.2)
Total Protein: 6.6 g/dL (ref 6.0–8.3)

## 2019-09-09 LAB — URINALYSIS, ROUTINE W REFLEX MICROSCOPIC
Bilirubin Urine: NEGATIVE
Glucose, UA: NEGATIVE
Hgb urine dipstick: NEGATIVE
Ketones, ur: NEGATIVE
Leukocytes,Ua: NEGATIVE
Nitrite: NEGATIVE
Protein, ur: NEGATIVE
Specific Gravity, Urine: 1.02 (ref 1.001–1.03)
pH: 7 (ref 5.0–8.0)

## 2019-09-09 LAB — CBC WITH DIFFERENTIAL/PLATELET
Basophils Absolute: 0.1 10*3/uL (ref 0.0–0.1)
Basophils Relative: 1.1 % (ref 0.0–3.0)
Eosinophils Absolute: 0.3 10*3/uL (ref 0.0–0.7)
Eosinophils Relative: 4 % (ref 0.0–5.0)
HCT: 41.6 % (ref 36.0–46.0)
Hemoglobin: 14.1 g/dL (ref 12.0–15.0)
Lymphocytes Relative: 30.3 % (ref 12.0–46.0)
Lymphs Abs: 2 10*3/uL (ref 0.7–4.0)
MCHC: 33.9 g/dL (ref 30.0–36.0)
MCV: 92.1 fl (ref 78.0–100.0)
Monocytes Absolute: 0.6 10*3/uL (ref 0.1–1.0)
Monocytes Relative: 8.8 % (ref 3.0–12.0)
Neutro Abs: 3.6 10*3/uL (ref 1.4–7.7)
Neutrophils Relative %: 55.8 % (ref 43.0–77.0)
Platelets: 245 10*3/uL (ref 150.0–400.0)
RBC: 4.51 Mil/uL (ref 3.87–5.11)
RDW: 12.9 % (ref 11.5–15.5)
WBC: 6.5 10*3/uL (ref 4.0–10.5)

## 2019-09-09 LAB — TSH: TSH: 1.15 u[IU]/mL (ref 0.35–4.50)

## 2019-09-09 LAB — VITAMIN D 25 HYDROXY (VIT D DEFICIENCY, FRACTURES): VITD: 51.37 ng/mL (ref 30.00–100.00)

## 2019-09-09 LAB — HEMOGLOBIN A1C: Hgb A1c MFr Bld: 5.6 % (ref 4.6–6.5)

## 2019-09-09 NOTE — Addendum Note (Signed)
Addended by: Leeanne Rio on: 09/09/2019 08:40 AM   Modules accepted: Orders

## 2019-09-09 NOTE — Addendum Note (Signed)
Addended by: Leeanne Rio on: 09/09/2019 09:52 AM   Modules accepted: Orders

## 2019-09-17 ENCOUNTER — Other Ambulatory Visit: Payer: Self-pay | Admitting: Internal Medicine

## 2019-09-17 DIAGNOSIS — R748 Abnormal levels of other serum enzymes: Secondary | ICD-10-CM

## 2019-10-04 ENCOUNTER — Other Ambulatory Visit: Payer: Self-pay

## 2019-10-04 ENCOUNTER — Ambulatory Visit
Admission: RE | Admit: 2019-10-04 | Discharge: 2019-10-04 | Disposition: A | Discharge status: Home / Self Care | Payer: PPO | Source: Ambulatory Visit | Attending: Internal Medicine | Admitting: Internal Medicine

## 2019-10-04 DIAGNOSIS — R748 Abnormal levels of other serum enzymes: Secondary | ICD-10-CM

## 2019-10-04 DIAGNOSIS — R945 Abnormal results of liver function studies: Secondary | ICD-10-CM | POA: Diagnosis not present

## 2019-10-06 ENCOUNTER — Telehealth: Payer: Self-pay | Admitting: Internal Medicine

## 2019-10-06 NOTE — Telephone Encounter (Signed)
Patient informed and verbalized understanding.   She is wondering if it is not her liver, then what could be causing her elevated Bilirubin levels?   Please advise

## 2019-10-06 NOTE — Telephone Encounter (Signed)
Medications like lipitor can cause this   [place]

## 2019-10-06 NOTE — Telephone Encounter (Signed)
Patient informed and verbalized understanding

## 2019-10-06 NOTE — Telephone Encounter (Signed)
-----   Message from Delorise Jackson, MD sent at 10/04/2019  8:49 PM EDT ----- Liver cyst 2.7 cm not concerning/worrisome simple cyst  Cyst in the left kidney 2.0 cm  No reason for elevated liver enzymes

## 2019-11-23 ENCOUNTER — Other Ambulatory Visit: Payer: Self-pay

## 2019-11-25 ENCOUNTER — Encounter: Payer: Self-pay | Admitting: Internal Medicine

## 2019-11-25 ENCOUNTER — Ambulatory Visit (INDEPENDENT_AMBULATORY_CARE_PROVIDER_SITE_OTHER): Payer: PPO | Admitting: Internal Medicine

## 2019-11-25 ENCOUNTER — Other Ambulatory Visit: Payer: Self-pay

## 2019-11-25 VITALS — BP 124/70 | HR 72 | Temp 98.5°F | Ht 60.75 in | Wt 133.6 lb

## 2019-11-25 DIAGNOSIS — L304 Erythema intertrigo: Secondary | ICD-10-CM | POA: Insufficient documentation

## 2019-11-25 DIAGNOSIS — Z0100 Encounter for examination of eyes and vision without abnormal findings: Secondary | ICD-10-CM

## 2019-11-25 DIAGNOSIS — M19011 Primary osteoarthritis, right shoulder: Secondary | ICD-10-CM

## 2019-11-25 DIAGNOSIS — M47816 Spondylosis without myelopathy or radiculopathy, lumbar region: Secondary | ICD-10-CM | POA: Diagnosis not present

## 2019-11-25 DIAGNOSIS — R17 Unspecified jaundice: Secondary | ICD-10-CM | POA: Diagnosis not present

## 2019-11-25 DIAGNOSIS — R030 Elevated blood-pressure reading, without diagnosis of hypertension: Secondary | ICD-10-CM | POA: Diagnosis not present

## 2019-11-25 LAB — HEPATIC FUNCTION PANEL
ALT: 18 U/L (ref 0–35)
AST: 19 U/L (ref 0–37)
Albumin: 4.4 g/dL (ref 3.5–5.2)
Alkaline Phosphatase: 43 U/L (ref 39–117)
Bilirubin, Direct: 0.3 mg/dL (ref 0.0–0.3)
Total Bilirubin: 1.9 mg/dL — ABNORMAL HIGH (ref 0.2–1.2)
Total Protein: 6.8 g/dL (ref 6.0–8.3)

## 2019-11-25 LAB — BILIRUBIN, DIRECT: Bilirubin, Direct: 0.3 mg/dL (ref 0.0–0.3)

## 2019-11-25 MED ORDER — KETOCONAZOLE 2 % EX CREA: 1.0000 "application " | TOPICAL_CREAM | Freq: Two times a day (BID) | CUTANEOUS | 11 refills | Status: DC | PRN

## 2019-11-25 NOTE — Patient Instructions (Addendum)
Voltaren gel 4x per day shoulder    DASH Eating Plan DASH stands for "Dietary Approaches to Stop Hypertension." The DASH eating plan is a healthy eating plan that has been shown to reduce high blood pressure (hypertension). It may also reduce your risk for type 2 diabetes, heart disease, and stroke. The DASH eating plan may also help with weight loss. What are tips for following this plan?  General guidelines  Avoid eating more than 2,300 mg (milligrams) of salt (sodium) a day. If you have hypertension, you may need to reduce your sodium intake to 1,500 mg a day.  Limit alcohol intake to no more than 1 drink a day for nonpregnant women and 2 drinks a day for men. One drink equals 12 oz of beer, 5 oz of wine, or 1 oz of hard liquor.  Work with your health care provider to maintain a healthy body weight or to lose weight. Ask what an ideal weight is for you.  Get at least 30 minutes of exercise that causes your heart to beat faster (aerobic exercise) most days of the week. Activities may include walking, swimming, or biking.  Work with your health care provider or diet and nutrition specialist (dietitian) to adjust your eating plan to your individual calorie needs. Reading food labels   Check food labels for the amount of sodium per serving. Choose foods with less than 5 percent of the Daily Value of sodium. Generally, foods with less than 300 mg of sodium per serving fit into this eating plan.  To find whole grains, look for the word "whole" as the first word in the ingredient list. Shopping  Buy products labeled as "low-sodium" or "no salt added."  Buy fresh foods. Avoid canned foods and premade or frozen meals. Cooking  Avoid adding salt when cooking. Use salt-free seasonings or herbs instead of table salt or sea salt. Check with your health care provider or pharmacist before using salt substitutes.  Do not fry foods. Cook foods using healthy methods such as baking, boiling,  grilling, and broiling instead.  Cook with heart-healthy oils, such as olive, canola, soybean, or sunflower oil. Meal planning  Eat a balanced diet that includes: ? 5 or more servings of fruits and vegetables each day. At each meal, try to fill half of your plate with fruits and vegetables. ? Up to 6-8 servings of whole grains each day. ? Less than 6 oz of lean meat, poultry, or fish each day. A 3-oz serving of meat is about the same size as a deck of cards. One egg equals 1 oz. ? 2 servings of low-fat dairy each day. ? A serving of nuts, seeds, or beans 5 times each week. ? Heart-healthy fats. Healthy fats called Omega-3 fatty acids are found in foods such as flaxseeds and coldwater fish, like sardines, salmon, and mackerel.  Limit how much you eat of the following: ? Canned or prepackaged foods. ? Food that is high in trans fat, such as fried foods. ? Food that is high in saturated fat, such as fatty meat. ? Sweets, desserts, sugary drinks, and other foods with added sugar. ? Full-fat dairy products.  Do not salt foods before eating.  Try to eat at least 2 vegetarian meals each week.  Eat more home-cooked food and less restaurant, buffet, and fast food.  When eating at a restaurant, ask that your food be prepared with less salt or no salt, if possible. What foods are recommended? The items listed may not  be a complete list. Talk with your dietitian about what dietary choices are best for you. Grains Whole-grain or whole-wheat bread. Whole-grain or whole-wheat pasta. Brown rice. [place]. Bulgur. Whole-grain and low-sodium cereals. Pita bread. Low-fat, low-sodium crackers. Whole-wheat flour tortillas. Vegetables Fresh or frozen vegetables (raw, steamed, roasted, or grilled). Low-sodium or reduced-sodium tomato and vegetable juice. Low-sodium or reduced-sodium tomato sauce and tomato paste. Low-sodium or reduced-sodium canned vegetables. Fruits All fresh, dried, or frozen  fruit. Canned fruit in natural juice (without added sugar). Meat and other protein foods Skinless chicken or [place]. Ground chicken or [place]. Pork with fat trimmed off. Fish and seafood. Egg whites. Dried beans, peas, or lentils. Unsalted nuts, nut butters, and seeds. Unsalted canned beans. Lean cuts of beef with fat trimmed off. Low-sodium, lean deli meat. Dairy Low-fat (1%) or fat-free (skim) milk. Fat-free, low-fat, or reduced-fat cheeses. Nonfat, low-sodium ricotta or cottage cheese. Low-fat or nonfat yogurt. Low-fat, low-sodium cheese. Fats and oils Soft margarine without trans fats. Vegetable oil. Low-fat, reduced-fat, or light mayonnaise and salad dressings (reduced-sodium). Canola, safflower, olive, soybean, and sunflower oils. Avocado. Seasoning and other foods Herbs. Spices. Seasoning mixes without salt. Unsalted popcorn and pretzels. Fat-free sweets. What foods are not recommended? The items listed may not be a complete list. Talk with your dietitian about what dietary choices are best for you. Grains Baked goods made with fat, such as croissants, muffins, or some breads. Dry pasta or rice meal packs. Vegetables Creamed or fried vegetables. Vegetables in a cheese sauce. Regular canned vegetables (not low-sodium or reduced-sodium). Regular canned tomato sauce and paste (not low-sodium or reduced-sodium). Regular tomato and vegetable juice (not low-sodium or reduced-sodium). Angie Fava. Olives. Fruits Canned fruit in a light or heavy syrup. Fried fruit. Fruit in cream or butter sauce. Meat and other protein foods Fatty cuts of meat. Ribs. Fried meat. Berniece Salines. Sausage. Bologna and other processed lunch meats. Salami. Fatback. Hotdogs. Bratwurst. Salted nuts and seeds. Canned beans with added salt. Canned or smoked fish. Whole eggs or egg yolks. Chicken or [place] with skin. Dairy Whole or 2% milk, cream, and half-and-half. Whole or full-fat cream cheese. Whole-fat or sweetened yogurt. Full-fat  cheese. Nondairy creamers. Whipped toppings. Processed cheese and cheese spreads. Fats and oils Butter. Stick margarine. Lard. Shortening. Ghee. Bacon fat. Tropical oils, such as coconut, palm kernel, or palm oil. Seasoning and other foods Salted popcorn and pretzels. Onion salt, garlic salt, seasoned salt, table salt, and sea salt. Worcestershire sauce. Tartar sauce. Barbecue sauce. Teriyaki sauce. Soy sauce, including reduced-sodium. Steak sauce. Canned and packaged gravies. Fish sauce. Oyster sauce. Cocktail sauce. Horseradish that you find on the shelf. Ketchup. Mustard. Meat flavorings and tenderizers. Bouillon cubes. Hot sauce and Tabasco sauce. Premade or packaged marinades. Premade or packaged taco seasonings. Relishes. Regular salad dressings. Where to find more information:  National Heart, Lung, and [place]: https://wilson-eaton.com/  American Heart Association: www.heart.org Summary  The DASH eating plan is a healthy eating plan that has been shown to reduce high blood pressure (hypertension). It may also reduce your risk for type 2 diabetes, heart disease, and stroke.  With the DASH eating plan, you should limit salt (sodium) intake to 2,300 mg a day. If you have hypertension, you may need to reduce your sodium intake to 1,500 mg a day.  When on the DASH eating plan, aim to eat more fresh fruits and vegetables, whole grains, lean proteins, low-fat dairy, and heart-healthy fats.  Work with your health care provider or diet and nutrition  specialist (dietitian) to adjust your eating plan to your individual calorie needs. This information is not intended to replace advice given to you by your health care provider. Make sure you discuss any questions you have with your health care provider. Document Revised: 03/28/2017 Document Reviewed: 04/08/2016 Elsevier Patient Education  [place].  Shoulder Exercises Ask your health care provider which exercises are safe for you. Do  exercises exactly as told by your health care provider and adjust them as directed. It is normal to feel mild stretching, pulling, tightness, or discomfort as you do these exercises. Stop right away if you feel sudden pain or your pain gets worse. Do not begin these exercises until told by your health care provider. Stretching exercises External rotation and abduction This exercise is sometimes called corner stretch. This exercise rotates your arm outward (external rotation) and moves your arm out from your body (abduction). 1. Stand in a doorway with one of your feet slightly in front of the other. This is called a staggered stance. If you cannot reach your forearms to the door frame, stand facing a corner of a room. 2. Choose one of the following positions as told by your health care provider: ? Place your hands and forearms on the door frame above your head. ? Place your hands and forearms on the door frame at the height of your head. ? Place your hands on the door frame at the height of your elbows. 3. Slowly move your weight onto your front foot until you feel a stretch across your chest and in the front of your shoulders. Keep your head and chest upright and keep your abdominal muscles tight. 4. Hold for __________ seconds. 5. To release the stretch, shift your weight to your back foot. Repeat __________ times. Complete this exercise __________ times a day. Extension, standing 1. Stand and hold a broomstick, a cane, or a similar object behind your back. ? Your hands should be a little wider than shoulder width apart. ? Your palms should face away from your back. 2. Keeping your elbows straight and your shoulder muscles relaxed, move the stick away from your body until you feel a stretch in your shoulders (extension). ? Avoid shrugging your shoulders while you move the stick. Keep your shoulder blades tucked down toward the middle of your back. 3. Hold for __________ seconds. 4. Slowly  return to the starting position. Repeat __________ times. Complete this exercise __________ times a day. Range-of-motion exercises Pendulum  1. Stand near a wall or a surface that you can hold onto for balance. 2. Bend at the waist and let your left / right arm hang straight down. Use your other arm to support you. Keep your back straight and do not lock your knees. 3. Relax your left / right arm and shoulder muscles, and move your hips and your trunk so your left / right arm swings freely. Your arm should swing because of the motion of your body, not because you are using your arm or shoulder muscles. 4. Keep moving your hips and trunk so your arm swings in the following directions, as told by your health care provider: ? Side to side. ? Forward and backward. ? In clockwise and counterclockwise circles. 5. Continue each motion for __________ seconds, or for as long as told by your health care provider. 6. Slowly return to the starting position. Repeat __________ times. Complete this exercise __________ times a day. Shoulder flexion, standing  1. Stand and hold a  broomstick, a cane, or a similar object. Place your hands a little more than shoulder width apart on the object. Your left / right hand should be palm up, and your other hand should be palm down. 2. Keep your elbow straight and your shoulder muscles relaxed. Push the stick up with your healthy arm to raise your left / right arm in front of your body, and then over your head until you feel a stretch in your shoulder (flexion). ? Avoid shrugging your shoulder while you raise your arm. Keep your shoulder blade tucked down toward the middle of your back. 3. Hold for __________ seconds. 4. Slowly return to the starting position. Repeat __________ times. Complete this exercise __________ times a day. Shoulder abduction, standing 1. Stand and hold a broomstick, a cane, or a similar object. Place your hands a little more than shoulder width  apart on the object. Your left / right hand should be palm up, and your other hand should be palm down. 2. Keep your elbow straight and your shoulder muscles relaxed. Push the object across your body toward your left / right side. Raise your left / right arm to the side of your body (abduction) until you feel a stretch in your shoulder. ? Do not raise your arm above shoulder height unless your health care provider tells you to do that. ? If directed, raise your arm over your head. ? Avoid shrugging your shoulder while you raise your arm. Keep your shoulder blade tucked down toward the middle of your back. 3. Hold for __________ seconds. 4. Slowly return to the starting position. Repeat __________ times. Complete this exercise __________ times a day. Internal rotation  1. Place your left / right hand behind your back, palm up. 2. Use your other hand to dangle an exercise band, a towel, or a similar object over your shoulder. Grasp the band with your left / right hand so you are holding on to both ends. 3. Gently pull up on the band until you feel a stretch in the front of your left / right shoulder. The movement of your arm toward the center of your body is called internal rotation. ? Avoid shrugging your shoulder while you raise your arm. Keep your shoulder blade tucked down toward the middle of your back. 4. Hold for __________ seconds. 5. Release the stretch by letting go of the band and lowering your hands. Repeat __________ times. Complete this exercise __________ times a day. Strengthening exercises External rotation  1. Sit in a stable chair without armrests. 2. Secure an exercise band to a stable object at elbow height on your left / right side. 3. Place a soft object, such as a folded towel or a small pillow, between your left / right upper arm and your body to move your elbow about 4 inches (10 cm) away from your side. 4. Hold the end of the exercise band so it is tight and there is  no slack. 5. Keeping your elbow pressed against the soft object, slowly move your forearm out, away from your abdomen (external rotation). Keep your body steady so only your forearm moves. 6. Hold for __________ seconds. 7. Slowly return to the starting position. Repeat __________ times. Complete this exercise __________ times a day. Shoulder abduction  1. Sit in a stable chair without armrests, or stand up. 2. Hold a __________ weight in your left / right hand, or hold an exercise band with both hands. 3. Start with your arms straight  down and your left / right palm facing in, toward your body. 4. Slowly lift your left / right hand out to your side (abduction). Do not lift your hand above shoulder height unless your health care provider tells you that this is safe. ? Keep your arms straight. ? Avoid shrugging your shoulder while you do this movement. Keep your shoulder blade tucked down toward the middle of your back. 5. Hold for __________ seconds. 6. Slowly lower your arm, and return to the starting position. Repeat __________ times. Complete this exercise __________ times a day. Shoulder extension 1. Sit in a stable chair without armrests, or stand up. 2. Secure an exercise band to a stable object in front of you so it is at shoulder height. 3. Hold one end of the exercise band in each hand. Your palms should face each other. 4. Straighten your elbows and lift your hands up to shoulder height. 5. Step back, away from the secured end of the exercise band, until the band is tight and there is no slack. 6. Squeeze your shoulder blades together as you pull your hands down to the sides of your thighs (extension). Stop when your hands are straight down by your sides. Do not let your hands go behind your body. 7. Hold for __________ seconds. 8. Slowly return to the starting position. Repeat __________ times. Complete this exercise __________ times a day. Shoulder row 1. Sit in a stable chair  without armrests, or stand up. 2. Secure an exercise band to a stable object in front of you so it is at waist height. 3. Hold one end of the exercise band in each hand. Position your palms so that your thumbs are facing the ceiling (neutral position). 4. Bend each of your elbows to a 90-degree angle (right angle) and keep your upper arms at your sides. 5. Step back until the band is tight and there is no slack. 6. Slowly pull your elbows back behind you. 7. Hold for __________ seconds. 8. Slowly return to the starting position. Repeat __________ times. Complete this exercise __________ times a day. Shoulder press-ups  1. Sit in a stable chair that has armrests. Sit upright, with your feet flat on the floor. 2. Put your hands on the armrests so your elbows are bent and your fingers are pointing forward. Your hands should be about even with the sides of your body. 3. Push down on the armrests and use your arms to lift yourself off the chair. Straighten your elbows and lift yourself up as much as you comfortably can. ? Move your shoulder blades down, and avoid letting your shoulders move up toward your ears. ? Keep your feet on the ground. As you get stronger, your feet should support less of your body weight as you lift yourself up. 4. Hold for __________ seconds. 5. Slowly lower yourself back into the chair. Repeat __________ times. Complete this exercise __________ times a day. Wall push-ups  1. Stand so you are facing a stable wall. Your feet should be about one arm-length away from the wall. 2. Lean forward and place your palms on the wall at shoulder height. 3. Keep your feet flat on the floor as you bend your elbows and lean forward toward the wall. 4. Hold for __________ seconds. 5. Straighten your elbows to push yourself back to the starting position. Repeat __________ times. Complete this exercise __________ times a day. This information is not intended to replace advice given to  you by your  health care provider. Make sure you discuss any questions you have with your health care provider. Document Revised: 08/07/2018 Document Reviewed: 05/15/2018 Elsevier Patient Education  [place] or Strain Rehab Ask your health care provider which exercises are safe for you. Do exercises exactly as told by your health care provider and adjust them as directed. It is normal to feel mild stretching, pulling, tightness, or discomfort as you do these exercises. Stop right away if you feel sudden pain or your pain gets worse. Do not begin these exercises until told by your health care provider. Stretching and range-of-motion exercises These exercises warm up your muscles and joints and improve the movement and flexibility of your back. These exercises also help to relieve pain, numbness, and tingling. Lumbar rotation  6. Lie on your back on a firm surface and bend your knees. 7. Straighten your arms out to your sides so each arm forms a 90-degree angle (right angle) with a side of your body. 8. Slowly move (rotate) both of your knees to one side of your body until you feel a stretch in your lower back (lumbar). Try not to let your shoulders lift off the floor. 9. Hold this position for __________ seconds. 10. Tense your abdominal muscles and slowly move your knees back to the starting position. 11. Repeat this exercise on the other side of your body. Repeat __________ times. Complete this exercise __________ times a day. Single knee to chest  5. Lie on your back on a firm surface with both legs straight. 6. Bend one of your knees. Use your hands to move your knee up toward your chest until you feel a gentle stretch in your lower back and buttock. ? Hold your leg in this position by holding on to the front of your knee. ? Keep your other leg as straight as possible. 7. Hold this position for __________ seconds. 8. Slowly return to the starting  position. 9. Repeat with your other leg. Repeat __________ times. Complete this exercise __________ times a day. Prone extension on elbows  7. Lie on your abdomen on a firm surface (prone position). 8. Prop yourself up on your elbows. 9. Use your arms to help lift your chest up until you feel a gentle stretch in your abdomen and your lower back. ? This will place some of your body weight on your elbows. If this is uncomfortable, try stacking pillows under your chest. ? Your hips should stay down, against the surface that you are lying on. Keep your hip and back muscles relaxed. 10. Hold this position for __________ seconds. 11. Slowly relax your upper body and return to the starting position. Repeat __________ times. Complete this exercise __________ times a day. Strengthening exercises These exercises build strength and endurance in your back. Endurance is the ability to use your muscles for a long time, even after they get tired. Pelvic tilt This exercise strengthens the muscles that lie deep in the abdomen. 5. Lie on your back on a firm surface. Bend your knees and keep your feet flat on the floor. 6. Tense your abdominal muscles. Tip your pelvis up toward the ceiling and flatten your lower back into the floor. ? To help with this exercise, you may place a small towel under your lower back and try to push your back into the towel. 7. Hold this position for __________ seconds. 8. Let your muscles relax completely before you repeat this exercise. Repeat __________ times. Complete this  exercise __________ times a day. Alternating arm and leg raises  5. Get on your hands and knees on a firm surface. If you are on a hard floor, you may want to use padding, such as an exercise mat, to cushion your knees. 6. Line up your arms and legs. Your hands should be directly below your shoulders, and your knees should be directly below your hips. 7. Lift your left leg behind you. At the same time, raise  your right arm and straighten it in front of you. ? Do not lift your leg higher than your hip. ? Do not lift your arm higher than your shoulder. ? Keep your abdominal and back muscles tight. ? Keep your hips facing the ground. ? Do not arch your back. ? Keep your balance carefully, and do not hold your breath. 8. Hold this position for __________ seconds. 9. Slowly return to the starting position. 10. Repeat with your right leg and your left arm. Repeat __________ times. Complete this exercise __________ times a day. Abdominal set with straight leg raise  6. Lie on your back on a firm surface. 7. Bend one of your knees and keep your other leg straight. 8. Tense your abdominal muscles and lift your straight leg up, 4-6 inches (10-15 cm) off the ground. 9. Keep your abdominal muscles tight and hold this position for __________ seconds. ? Do not hold your breath. ? Do not arch your back. Keep it flat against the ground. 10. Keep your abdominal muscles tense as you slowly lower your leg back to the starting position. 11. Repeat with your other leg. Repeat __________ times. Complete this exercise __________ times a day. Single leg lower with bent knees 8. Lie on your back on a firm surface. 9. Tense your abdominal muscles and lift your feet off the floor, one foot at a time, so your knees and hips are bent in 90-degree angles (right angles). ? Your knees should be over your hips and your lower legs should be parallel to the floor. 10. Keeping your abdominal muscles tense and your knee bent, slowly lower one of your legs so your toe touches the ground. 11. Lift your leg back up to return to the starting position. ? Do not hold your breath. ? Do not let your back arch. Keep your back flat against the ground. 12. Repeat with your other leg. Repeat __________ times. Complete this exercise __________ times a day. Posture and body mechanics Good posture and healthy body mechanics can help to  relieve stress in your body's tissues and joints. Body mechanics refers to the movements and positions of your body while you do your daily activities. Posture is part of body mechanics. Good posture means:  Your spine is in its natural S-curve position (neutral).  Your shoulders are pulled back slightly.  Your head is not tipped forward. Follow these guidelines to improve your posture and body mechanics in your everyday activities. Standing   When standing, keep your spine neutral and your feet about hip width apart. Keep a slight bend in your knees. Your ears, shoulders, and hips should line up.  When you do a task in which you stand in one place for a long time, place one foot up on a stable object that is 2-4 inches (5-10 cm) high, such as a footstool. This helps keep your spine neutral. Sitting   When sitting, keep your spine neutral and keep your feet flat on the floor. Use a footrest, if necessary,  and keep your thighs parallel to the floor. Avoid rounding your shoulders, and avoid tilting your head forward.  When working at a desk or a computer, keep your desk at a height where your hands are slightly lower than your elbows. Slide your chair under your desk so you are close enough to maintain good posture.  When working at a computer, place your monitor at a height where you are looking straight ahead and you do not have to tilt your head forward or downward to look at the screen. Resting  When lying down and resting, avoid positions that are most painful for you.  If you have pain with activities such as sitting, bending, stooping, or squatting, lie in a position in which your body does not bend very much. For example, avoid curling up on your side with your arms and knees near your chest (fetal position).  If you have pain with activities such as standing for a long time or reaching with your arms, lie with your spine in a neutral position and bend your knees slightly. Try the  following positions: ? Lying on your side with a pillow between your knees. ? Lying on your back with a pillow under your knees. Lifting   When lifting objects, keep your feet at least shoulder width apart and tighten your abdominal muscles.  Bend your knees and hips and keep your spine neutral. It is important to lift using the strength of your legs, not your back. Do not lock your knees straight out.  Always ask for help to lift heavy or awkward objects. This information is not intended to replace advice given to you by your health care provider. Make sure you discuss any questions you have with your health care provider. Document Revised: 08/07/2018 Document Reviewed: 05/07/2018 Elsevier Patient Education  [place].  Back Exercises The following exercises strengthen the muscles that help to support the trunk and back. They also help to keep the lower back flexible. Doing these exercises can help to prevent back pain or lessen existing pain.  If you have back pain or discomfort, try doing these exercises 2-3 times each day or as told by your health care provider.  As your pain improves, do them once each day, but increase the number of times that you repeat the steps for each exercise (do more repetitions).  To prevent the recurrence of back pain, continue to do these exercises once each day or as told by your health care provider. Do exercises exactly as told by your health care provider and adjust them as directed. It is normal to feel mild stretching, pulling, tightness, or discomfort as you do these exercises, but you should stop right away if you feel sudden pain or your pain gets worse. Exercises Single knee to chest Repeat these steps 3-5 times for each leg: 1. Lie on your back on a firm bed or the floor with your legs extended. 2. Bring one knee to your chest. Your other leg should stay extended and in contact with the floor. 3. Hold your knee in place by grabbing  your knee or thigh with both hands and hold. 4. Pull on your knee until you feel a gentle stretch in your lower back or buttocks. 5. Hold the stretch for 10-30 seconds. 6. Slowly release and straighten your leg. Pelvic tilt Repeat these steps 5-10 times: 1. Lie on your back on a firm bed or the floor with your legs extended. 2. Bend your knees  so they are pointing toward the ceiling and your feet are flat on the floor. 3. Tighten your lower abdominal muscles to press your lower back against the floor. This motion will tilt your pelvis so your tailbone points up toward the ceiling instead of pointing to your feet or the floor. 4. With gentle tension and even breathing, hold this position for 5-10 seconds. Cat-cow Repeat these steps until your lower back becomes more flexible: 1. Get into a hands-and-knees position on a firm surface. Keep your hands under your shoulders, and keep your knees under your hips. You may place padding under your knees for comfort. 2. Let your head hang down toward your chest. Contract your abdominal muscles and point your tailbone toward the floor so your lower back becomes rounded like the back of a cat. 3. Hold this position for 5 seconds. 4. Slowly lift your head, let your abdominal muscles relax and point your tailbone up toward the ceiling so your back forms a sagging arch like the back of a cow. 5. Hold this position for 5 seconds.  Press-ups Repeat these steps 5-10 times: 1. Lie on your abdomen (face-down) on the floor. 2. Place your palms near your head, about shoulder-width apart. 3. Keeping your back as relaxed as possible and keeping your hips on the floor, slowly straighten your arms to raise the top half of your body and lift your shoulders. Do not use your back muscles to raise your upper torso. You may adjust the placement of your hands to make yourself more comfortable. 4. Hold this position for 5 seconds while you keep your back relaxed. 5. Slowly  return to lying flat on the floor.  Bridges Repeat these steps 10 times: 1. Lie on your back on a firm surface. 2. Bend your knees so they are pointing toward the ceiling and your feet are flat on the floor. Your arms should be flat at your sides, next to your body. 3. Tighten your buttocks muscles and lift your buttocks off the floor until your waist is at almost the same height as your knees. You should feel the muscles working in your buttocks and the back of your thighs. If you do not feel these muscles, slide your feet 1-2 inches farther away from your buttocks. 4. Hold this position for 3-5 seconds. 5. Slowly lower your hips to the starting position, and allow your buttocks muscles to relax completely. If this exercise is too easy, try doing it with your arms crossed over your chest. Abdominal crunches Repeat these steps 5-10 times: 1. Lie on your back on a firm bed or the floor with your legs extended. 2. Bend your knees so they are pointing toward the ceiling and your feet are flat on the floor. 3. Cross your arms over your chest. 4. Tip your chin slightly toward your chest without bending your neck. 5. Tighten your abdominal muscles and slowly raise your trunk (torso) high enough to lift your shoulder blades a tiny bit off the floor. Avoid raising your torso higher than that because it can put too much stress on your low back and does not help to strengthen your abdominal muscles. 6. Slowly return to your starting position. Back lifts Repeat these steps 5-10 times: 1. Lie on your abdomen (face-down) with your arms at your sides, and rest your forehead on the floor. 2. Tighten the muscles in your legs and your buttocks. 3. Slowly lift your chest off the floor while you keep your hips  pressed to the floor. Keep the back of your head in line with the curve in your back. Your eyes should be looking at the floor. 4. Hold this position for 3-5 seconds. 5. Slowly return to your starting  position. Contact a health care provider if:  Your back pain or discomfort gets much worse when you do an exercise.  Your worsening back pain or discomfort does not lessen within 2 hours after you exercise. If you have any of these problems, stop doing these exercises right away. Do not do them again unless your health care provider says that you can. Get help right away if:  You develop sudden, severe back pain. If this happens, stop doing the exercises right away. Do not do them again unless your health care provider says that you can. This information is not intended to replace advice given to you by your health care provider. Make sure you discuss any questions you have with your health care provider. Document Revised: 08/20/2018 Document Reviewed: 01/15/2018 Elsevier Patient Education  [place].

## 2019-11-25 NOTE — Progress Notes (Signed)
Chief Complaint  Patient presents with  . Follow-up   F/u  1.BP elevated today repeat improved given BP log to monitor  2. HLD on lipitor  10 mg qhs and doing well labs checked 09/09/19 Cholesterol controlled  3. No low back pain but arthritis noted back and scoliosis Xray 08/10/19  Right shoulder pain + arthritis    Review of Systems  Constitutional: Negative for weight loss.  HENT: Negative for hearing loss.   Eyes: Negative for blurred vision.  Respiratory: Negative for shortness of breath.   Cardiovascular: Negative for chest pain.  Gastrointestinal: Negative for abdominal pain.  Musculoskeletal: Positive for joint pain.  Skin: Negative for rash.  Neurological: Negative for headaches.  Psychiatric/Behavioral: Negative for depression.   Past Medical History:  Diagnosis Date  . Arthritis   . Breast mass    right  . Cancer ([place])    skin cancer SCC, BCC multiple   . Headache   . History of bronchitis   . History of pneumonia   . Hyperlipidemia    Past Surgical History:  Procedure Laterality Date  . BREAST LUMPECTOMY     right 2016 negative malignancy fibrocystic changes   . COLONOSCOPY    . DILATION AND CURETTAGE OF UTERUS    . JOINT REPLACEMENT     left   . MOHS SURGERY     x2 face   . MOHS SURGERY     L alar 02/04/18   . RADIOACTIVE SEED GUIDED EXCISIONAL BREAST BIOPSY Right 04/06/2015   Procedure: RADIOACTIVE SEED GUIDED EXCISIONAL RIGHT BREAST BIOPSY;  Surgeon: Stark Klein, MD;  Location: [facility];  Service: General;  Laterality: Right;  . 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   Procedure: TOTAL HIP ARTHROPLASTY ANTERIOR APPROACH;  Surgeon: Renette Butters, MD;  Location: [facility];  Service: Orthopedics;  Laterality: Left;  . WISDOM TOOTH EXTRACTION     Family History  Problem Relation Age of Onset  . Stroke Mother   . Alzheimer's disease Father    Social History   Socioeconomic History  . Marital status: Widowed    Spouse name: Not on file  . Number of  children: Not on file  . Years of education: Not on file  . Highest education level: Not on file  Occupational History  . Not on file  Tobacco Use  . Smoking status: Never Smoker  . Smokeless tobacco: Never Used  Substance and Sexual Activity  . Alcohol use: Yes    Comment: social  . Drug use: No  . Sexual activity: Not Currently  Other Topics Concern  . 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 Difficulty of Paying Living Expenses:   Food Insecurity:   . Worried About Charity fundraiser in the Last Year:   . Arboriculturist in the Last Year:   Transportation Needs:   . Film/video editor (Medical):   Marland Kitchen Lack of Transportation (Non-Medical):   Physical Activity: Insufficiently Active  . Days of Exercise per Week: 4 days  . Minutes of Exercise per Session: 30 min  Stress:   .  Feeling of Stress :   Social Connections:   . Frequency of Communication with Friends and Family:   . Frequency of Social Gatherings with Friends and Family:   . Attends Religious Services:   . Active Member of Clubs or Organizations:   . Attends Archivist Meetings:   Marland Kitchen Marital Status:   Intimate Partner Violence:   . Fear of Current or Ex-Partner:   . Emotionally Abused:   Marland Kitchen Physically Abused:   . Sexually Abused:    Current Meds  Medication Sig  . Acetaminophen (TYLENOL ARTHRITIS PAIN PO) Take by mouth.  . Apoaequorin (PREVAGEN EXTRA STRENGTH PO) Take by mouth.  Marland Kitchen aspirin 81 MG tablet Take 81 mg by mouth daily.  Marland Kitchen atorvastatin (LIPITOR) 10 MG tablet Take 1 tablet (10 mg total) by mouth daily at 6 PM.  . Biotin 10 MG CAPS Take 1,000 mg by mouth daily.   . Calcium  Carb-Cholecalciferol ([employer] CALCIUM 600 & VITAMIN D3 PO) Take by mouth 2 (two) times daily after a meal.  . calcium carbonate (OS-CAL) 1250 (500 CA) MG chewable tablet Chew 1 tablet by mouth daily.  . cholecalciferol (VITAMIN D) 1000 UNITS tablet Take 1,000 Units by mouth daily.  Marland Kitchen ketoconazole (NIZORAL) 2 % cream Apply 1 application topically 2 (two) times daily as needed for irritation.  Javier Docker Oil 1000 MG CAPS Take 3 capsules by mouth daily.   . Multiple Vitamin (MULTIVITAMIN WITH MINERALS) TABS tablet Take 1 tablet by mouth daily.  . TURMERIC PO Take by mouth daily.  . vitamin C (ASCORBIC ACID) 250 MG tablet Take 500 mg by mouth daily. Taking two tablets daily  . [DISCONTINUED] ketoconazole (NIZORAL) 2 % cream Apply 1 application topically 2 (two) times daily as needed for irritation.   Allergies  Allergen Reactions  . Aspartame And Phenylalanine   . Meloxicam Hives and Diarrhea  . Other Itching    Sunscreens Lotions and Tanning products   Recent Results (from the past 2160 hour(s))  Lipid panel     Status: None   Collection Time: 09/09/19  8:32 AM  Result Value Ref Range   Cholesterol 174 0 - 200 mg/dL    Comment: ATP III Classification       Desirable:  < 200 mg/dL               Borderline High:  200 - 239 mg/dL          High:  > = 240 mg/dL   Triglycerides 144.0 0 - 149 mg/dL    Comment: Normal:  <150 mg/dLBorderline High:  150 - 199 mg/dL   HDL 68.30 >39.00 mg/dL   VLDL 28.8 0.0 - 40.0 mg/dL   LDL Cholesterol 76 0 - 99 mg/dL   Total CHOL/HDL Ratio 3     Comment:                Men          Women1/2 Average Risk     3.4          3.3Average Risk          5.0          4.42X Average Risk          9.6          7.13X Average Risk          15.0          11.0  NonHDL 105.20     Comment: NOTE:  Non-HDL goal should be 30 mg/dL higher than patient's LDL goal (i.e. LDL goal of < 70 mg/dL, would have non-HDL goal of < 100 mg/dL)  Vitamin D (25 hydroxy)     Status: None    Collection Time: 09/09/19  8:32 AM  Result Value Ref Range   VITD 51.37 30.00 - 100.00 ng/mL  TSH     Status: None   Collection Time: 09/09/19  8:32 AM  Result Value Ref Range   TSH 1.15 0.35 - 4.50 uIU/mL  HgB A1c     Status: None   Collection Time: 09/09/19  8:32 AM  Result Value Ref Range   Hgb A1c MFr Bld 5.6 4.6 - 6.5 %    Comment: Glycemic Control Guidelines for People with Diabetes:Non Diabetic:  <6%Goal of Therapy: <7%Additional Action Suggested:  >8%   Comprehensive metabolic panel     Status: Abnormal   Collection Time: 09/09/19  8:32 AM  Result Value Ref Range   Sodium 140 135 - 145 mEq/L   Potassium 4.2 3.5 - 5.1 mEq/L   Chloride 104 96 - 112 mEq/L   CO2 32 19 - 32 mEq/L   Glucose, Bld 82 70 - 99 mg/dL   BUN 15 6 - 23 mg/dL   Creatinine, Ser 0.76 0.40 - 1.20 mg/dL   Total Bilirubin 1.5 (H) 0.2 - 1.2 mg/dL   Alkaline Phosphatase 46 39 - 117 U/L   AST 17 0 - 37 U/L   ALT 15 0 - 35 U/L   Total Protein 6.6 6.0 - 8.3 g/dL   Albumin 4.2 3.5 - 5.2 g/dL   GFR 74.91 >60.00 mL/min   Calcium 10.2 8.4 - 10.5 mg/dL  CBC w/Diff     Status: None   Collection Time: 09/09/19  8:32 AM  Result Value Ref Range   WBC 6.5 4.0 - 10.5 K/uL   RBC 4.51 3.87 - 5.11 Mil/uL   Hemoglobin 14.1 12.0 - 15.0 g/dL   HCT 41.6 36 - 46 %   MCV 92.1 78.0 - 100.0 fl   MCHC 33.9 30.0 - 36.0 g/dL   RDW 12.9 11.5 - 15.5 %   Platelets 245.0 150 - 400 K/uL   Neutrophils Relative % 55.8 43 - 77 %   Lymphocytes Relative 30.3 12 - 46 %   Monocytes Relative 8.8 3 - 12 %   Eosinophils Relative 4.0 0 - 5 %   Basophils Relative 1.1 0 - 3 %   Neutro Abs 3.6 1.4 - 7.7 K/uL   Lymphs Abs 2.0 0.7 - 4.0 K/uL   Monocytes Absolute 0.6 0 - 1 K/uL   Eosinophils Absolute 0.3 0 - 0 K/uL   Basophils Absolute 0.1 0 - 0 K/uL  Urinalysis, Routine w reflex microscopic     Status: None   Collection Time: 09/09/19  9:40 AM  Result Value Ref Range   Color, Urine YELLOW YELLOW   APPearance CLEAR CLEAR   Specific  Gravity, Urine 1.020 1.001 - 1.03   pH 7.0 5.0 - 8.0   Glucose, UA NEGATIVE NEGATIVE   Bilirubin Urine NEGATIVE NEGATIVE   Ketones, ur NEGATIVE NEGATIVE   Hgb urine dipstick NEGATIVE NEGATIVE   Protein, ur NEGATIVE NEGATIVE   Nitrite NEGATIVE NEGATIVE   Leukocytes,Ua NEGATIVE NEGATIVE   Objective  Body mass index is 25.45 kg/m. Wt Readings from Last 3 Encounters:  11/25/19 133 lb 9.6 oz (60.6 kg)  08/19/19 135 lb 9.6 oz (  61.5 kg)  07/13/18 141 lb 3.2 oz (64 kg)   Temp Readings from Last 3 Encounters:  11/25/19 98.5 F (36.9 C) (Oral)  08/19/19 (!) 97.5 F (36.4 C) (Temporal)  07/13/18 98.2 F (36.8 C) (Oral)   BP Readings from Last 3 Encounters:  11/25/19 124/70  08/19/19 135/72  07/13/18 112/80   Pulse Readings from Last 3 Encounters:  11/25/19 72  08/19/19 70  07/13/18 80    Physical Exam Vitals and nursing note reviewed.  Constitutional:      Appearance: Normal appearance. She is well-developed and well-groomed.  HENT:     Head: Normocephalic and atraumatic.  Eyes:     Conjunctiva/sclera: Conjunctivae normal.     Pupils: Pupils are equal, round, and reactive to light.  Cardiovascular:     Rate and Rhythm: Normal rate and regular rhythm.     Heart sounds: Normal heart sounds. No murmur heard.   Pulmonary:     Effort: Pulmonary effort is normal.     Breath sounds: Normal breath sounds.  Abdominal:     General: Abdomen is flat. Bowel sounds are normal.     Tenderness: There is no abdominal tenderness.  Skin:    General: Skin is warm and dry.  Neurological:     General: No focal deficit present.     Mental Status: She is alert and oriented to person, place, and time. Mental status is at baseline.     Gait: Gait normal.  Psychiatric:        Attention and Perception: Attention and perception normal.        Mood and Affect: Mood and affect normal.        Speech: Speech normal.        Behavior: Behavior normal. Behavior is cooperative.        Thought  Content: Thought content normal.        Cognition and Memory: Cognition and memory normal.        Judgment: Judgment normal.     Assessment  Plan  Arthritis of right shoulder region voltaren gel  Exercises   Elevated bilirubin - Plan: Hepatic function panel, Bilirubin, Direct Repeat labs   Eye exam, routine - Plan: Ambulatory referral to Ophthalmology  Intertrigo - Plan: ketoconazole (NIZORAL) 2 % cream F/u derm as well 01/2020   Arthritis of lumbar spine Given back exercises  Elevated blood pressure reading Monitor BP log given    HM Had flu shot utd shingrix 2/2  utdTdap utd prevnar and pna 25 covid 2/2  Out of age window pap  07/29/19 neg mammo  DEXA 05/28/2018 osteopenia cologuard 05/13/17 negative  H/o skin SCC, BCC multiple f/u Dr. Laurence Ferrari last seen fall 2020  mohs 3rd to nose 01/2018 Dr. Elvera Bicker [facility]  [facility] left alar nose mohs on 02/04/18 Dr. Manley Mason, derm f/u 05/2018 appt derm 01/2020   Provider: Dr. Olivia Mackie [facility]

## 2019-11-25 NOTE — Progress Notes (Signed)
Patient flagged: Current status:  PATIENT IS OVERDUE FOR BMI FOLLOW UP PLAN BMI is estimated to be 25.5 based on the last recorded weight and height

## 2020-01-06 ENCOUNTER — Encounter: Payer: PPO | Admitting: Dermatology

## 2020-01-14 DIAGNOSIS — H2513 Age-related nuclear cataract, bilateral: Secondary | ICD-10-CM | POA: Diagnosis not present

## 2020-02-09 ENCOUNTER — Other Ambulatory Visit: Payer: Self-pay

## 2020-02-09 ENCOUNTER — Ambulatory Visit: Payer: PPO | Admitting: Dermatology

## 2020-02-09 DIAGNOSIS — L814 Other melanin hyperpigmentation: Secondary | ICD-10-CM | POA: Diagnosis not present

## 2020-02-09 DIAGNOSIS — Z1283 Encounter for screening for malignant neoplasm of skin: Secondary | ICD-10-CM | POA: Diagnosis not present

## 2020-02-09 DIAGNOSIS — L821 Other seborrheic keratosis: Secondary | ICD-10-CM

## 2020-02-09 DIAGNOSIS — D485 Neoplasm of uncertain behavior of skin: Secondary | ICD-10-CM | POA: Diagnosis not present

## 2020-02-09 DIAGNOSIS — Z85828 Personal history of other malignant neoplasm of skin: Secondary | ICD-10-CM | POA: Diagnosis not present

## 2020-02-09 DIAGNOSIS — L308 Other specified dermatitis: Secondary | ICD-10-CM | POA: Diagnosis not present

## 2020-02-09 DIAGNOSIS — L219 Seborrheic dermatitis, unspecified: Secondary | ICD-10-CM

## 2020-02-09 DIAGNOSIS — D229 Melanocytic nevi, unspecified: Secondary | ICD-10-CM | POA: Diagnosis not present

## 2020-02-09 DIAGNOSIS — D1800 Hemangioma unspecified site: Secondary | ICD-10-CM | POA: Diagnosis not present

## 2020-02-09 DIAGNOSIS — L578 Other skin changes due to chronic exposure to nonionizing radiation: Secondary | ICD-10-CM

## 2020-02-09 DIAGNOSIS — B079 Viral wart, unspecified: Secondary | ICD-10-CM | POA: Diagnosis not present

## 2020-02-09 MED ORDER — KETOCONAZOLE 2 % EX SHAM: MEDICATED_SHAMPOO | CUTANEOUS | 2 refills | Status: DC

## 2020-02-09 MED ORDER — FLUOCINOLONE ACETONIDE BODY 0.01 % EX OIL: TOPICAL_OIL | CUTANEOUS | 2 refills | Status: DC

## 2020-02-09 MED ORDER — TRIAMCINOLONE ACETONIDE 0.1 % EX OINT: TOPICAL_OINTMENT | CUTANEOUS | 2 refills | Status: DC

## 2020-02-09 NOTE — Progress Notes (Signed)
Follow-Up Visit   Subjective  Andrea Spencer is a 74 y.o. female who presents for the following: FBSE.  Patient here for full body skin exam and skin cancer screening. She has a h/o BCC. There is a spot at patient's back that itches and has been there for about 6 months. Nothing else new or changing that she is aware of.  The following portions of the chart were reviewed this encounter and updated as appropriate:  Tobacco   Allergies   Meds   Problems   Med Hx   Surg Hx   Fam Hx       Review of Systems:  No other skin or systemic complaints except as noted in HPI or Assessment and Plan.  Objective  Well appearing patient in no apparent distress; mood and affect are within normal limits.  A full examination was performed including scalp, head, eyes, ears, nose, lips, neck, chest, axillae, abdomen, back, buttocks, bilateral upper extremities, bilateral lower extremities, hands, feet, fingers, toes, fingernails, and toenails. All findings within normal limits unless otherwise noted below.  Objective  Chest - Medial [facility]): Thin scaly erythematous papules coalescing to plaques   Objective  Scalp: Pink patches with greasy scale.   Objective  Low back left of midline: 0.6cm erythematous verrucous papule R/o Verruca vs Verrucous keratosis over SCC or other        Assessment & Plan  Other eczema Chest - Medial ([facility])  Start TMC 0.1% cream twice daily up to 2 weeks at a time as needed for itch. Avoid applying to face, groin, and axilla. Use as directed. Risk of skin atrophy with long-term use reviewed.   Recommend [place] Rapid Relief Anti-Itch cream up to 3 times per day to areas that are itchy.  Topical steroids (such as triamcinolone, fluocinolone, fluocinonide, mometasone, clobetasol, halobetasol, betamethasone, hydrocortisone) can cause thinning and lightening of the skin if they are used for too long in the same area. Your physician has selected the right  strength medicine for your problem and area affected on the body. Please use your medication only as directed by your physician to prevent side effects.    Ordered Medications: triamcinolone ointment (KENALOG) 0.1 %  Seborrheic dermatitis Scalp  Chronic, flared Start ketoconazole 2% shampoo 2-3 times a week. Massage into scalp and let sit 10 minutes before washing out. Start fluocinolone solution 1-2 times daily as needed for itch. Avoid applying to face, groin, and axilla. Use as directed. Risk of skin atrophy with long-term use reviewed.    ketoconazole (NIZORAL) 2 % shampoo - Scalp  Fluocinolone Acetonide Body 0.01 % OIL - Scalp  Neoplasm of uncertain behavior of skin Low back left of midline  Epidermal / dermal shaving  Lesion diameter (cm):  0.6 Informed consent: discussed and consent obtained   Timeout: patient name, date of birth, surgical site, and procedure verified   Patient was prepped and draped in usual sterile fashion: area prepped with isopropyl alcohol. Anesthesia: the lesion was anesthetized in a standard fashion   Anesthetic:  1% lidocaine w/ epinephrine 1-100,000 buffered w/ 8.4% NaHCO3 Instrument used: flexible razor blade   Hemostasis achieved with: aluminum chloride   Outcome: patient tolerated procedure well   Post-procedure details: wound care instructions given   Additional details:  Mupirocin and a bandage applied  Specimen 1 - Surgical pathology Differential Diagnosis:  Check Margins: No 0.6cm erythematous verrucous papule R/o Verruca vs Verrucous keratosis over SCC or other  Cryotherapy to base after biopsy  Prior to procedure, discussed risks of blister formation, small wound, skin dyspigmentation, or rare scar following cryotherapy.     Lentigines - Scattered tan macules - Discussed due to sun exposure - Benign, observe - Call for any changes  Seborrheic Keratoses - Stuck-on, waxy, tan-brown papules and plaques  - Discussed benign  etiology and prognosis. - Observe - Call for any changes  Melanocytic Nevi - Tan-brown and/or pink-flesh-colored symmetric macules and papules - Benign appearing on exam today - Observation - Call clinic for new or changing moles - Recommend daily use of broad spectrum spf 30+ sunscreen to sun-exposed areas.   Hemangiomas - Red papules - Discussed benign nature - Observe - Call for any changes  Actinic Damage - diffuse scaly erythematous macules with underlying dyspigmentation - Recommend daily broad spectrum sunscreen SPF 30+ to sun-exposed areas, reapply every 2 hours as needed.  - Call for new or changing lesions.  Skin cancer screening performed today. Marland Kitchen History of Basal Cell Carcinoma of the Skin - No evidence of recurrence today - Recommend regular full body skin exams - Recommend daily broad spectrum sunscreen SPF 30+ to sun-exposed areas, reapply every 2 hours as needed.  - Call if any new or changing lesions are noted between office visits   Return in about 6 months (around 08/09/2020) for FBSE; 6-8 weeks for recheck seb derm and eczema.  Graciella Belton, RMA, am acting as scribe for Forest Gleason, MD .  Documentation: I have reviewed the above documentation for accuracy and completeness, and I agree with the above.  Forest Gleason, MD

## 2020-02-09 NOTE — Patient Instructions (Addendum)
Melanoma ABCDEs  Melanoma is the most dangerous type of skin cancer, and is the leading cause of death from skin disease.  You are more likely to develop melanoma if you:  Have light-colored skin, light-colored eyes, or red or blond hair  Spend a lot of time in the sun  Tan regularly, either outdoors or in a tanning bed  Have had blistering sunburns, especially during childhood  Have a close family member who has had a melanoma  Have atypical moles or large birthmarks  Early detection of melanoma is key since treatment is typically straightforward and cure rates are extremely high if we catch it early.   The first sign of melanoma is often a change in a mole or a new dark spot.  The ABCDE system is a way of remembering the signs of melanoma.  A for asymmetry:  The two halves do not match. B for border:  The edges of the growth are irregular. C for color:  A mixture of colors are present instead of an even brown color. D for diameter:  Melanomas are usually (but not always) greater than 24mm - the size of a pencil eraser. E for evolution:  The spot keeps changing in size, shape, and color.  Please check your skin once per month between visits. You can use a small mirror in front and a large mirror behind you to keep an eye on the back side or your body.   If you see any new or changing lesions before your next follow-up, please call to schedule a visit.  Please continue daily skin protection including broad spectrum sunscreen SPF 30+ to sun-exposed areas, reapplying every 2 hours as needed when you're outdoors.   Recommend Gold Bond Rapid Relief Anti-Itch cream up to 3 times per day to areas that are itchy.  Topical steroids (such as triamcinolone, fluocinolone, fluocinonide, mometasone, clobetasol, halobetasol, betamethasone, hydrocortisone) can cause thinning and lightening of the skin if they are used for too long in the same area. Your physician has selected the right strength  medicine for your problem and area affected on the body. Please use your medication only as directed by your physician to prevent side effects.   Wound Care Instructions  1. Cleanse wound gently with soap and water once a day then pat dry with clean gauze. Apply a thing coat of Petrolatum (petroleum jelly, "Vaseline") over the wound (unless you have an allergy to this). We recommend that you use a new, sterile tube of Vaseline. Do not pick or remove scabs. Do not remove the yellow or white "healing tissue" from the base of the wound.  2. Cover the wound with fresh, clean, nonstick gauze and secure with paper tape. You may use Band-Aids in place of gauze and tape if the would is small enough, but would recommend trimming much of the tape off as there is often too much. Sometimes Band-Aids can irritate the skin.  3. You should call the office for your biopsy report after 1 week if you have not already been contacted.  4. If you experience any problems, such as abnormal amounts of bleeding, swelling, significant bruising, significant pain, or evidence of infection, please call the office immediately.  5. FOR ADULT SURGERY PATIENTS: If you need something for pain relief you may take 1 extra strength Tylenol (acetaminophen) AND 2 Ibuprofen (200mg  each) together every 4 hours as needed for pain. (do not take these if you are allergic to them or if you have a  reason you should not take them.) Typically, you may only need pain medication for 1 to 3 days.

## 2020-02-10 ENCOUNTER — Encounter: Payer: Self-pay | Admitting: Dermatology

## 2020-02-15 NOTE — Progress Notes (Signed)
Skin , low back left of midline VERRUCA VULGARIS, IRRITATED  This is a WART caused by the human papilloma virus. It is not dangerous but is contagious and can spread to other areas of skin or other people if it is not completely gone. No additional treatment is needed. However, if it comes back, we can freeze it in clinic with liquid nitrogen (a quick in office procedure) or you can also treat it at home with an over the counter salicylic wart treatment (slower).  Please call the office at [phone] or message [place] if you have have any questions.

## 2020-03-14 ENCOUNTER — Other Ambulatory Visit: Payer: Self-pay

## 2020-03-14 DIAGNOSIS — E785 Hyperlipidemia, unspecified: Secondary | ICD-10-CM

## 2020-03-14 MED ORDER — ATORVASTATIN CALCIUM 10 MG PO TABS: 10.0000 mg | ORAL_TABLET | Freq: Every day | ORAL | 1 refills | Status: DC

## 2020-04-03 ENCOUNTER — Ambulatory Visit: Payer: PPO

## 2020-04-03 ENCOUNTER — Telehealth: Payer: Self-pay

## 2020-04-03 NOTE — Telephone Encounter (Signed)
Unable to reach patient for schedule awv. No answer. Left message to call the office back and reschedule as convenient.

## 2020-04-05 ENCOUNTER — Ambulatory Visit: Payer: PPO | Admitting: Dermatology

## 2020-05-04 ENCOUNTER — Encounter: Payer: Self-pay | Admitting: Internal Medicine

## 2020-05-04 ENCOUNTER — Telehealth (INDEPENDENT_AMBULATORY_CARE_PROVIDER_SITE_OTHER): Payer: PPO | Admitting: Family

## 2020-05-04 ENCOUNTER — Encounter: Payer: Self-pay | Admitting: Family

## 2020-05-04 VITALS — Ht 60.75 in | Wt 133.0 lb

## 2020-05-04 DIAGNOSIS — J40 Bronchitis, not specified as acute or chronic: Secondary | ICD-10-CM | POA: Diagnosis not present

## 2020-05-04 MED ORDER — BENZONATATE 100 MG PO CAPS: 100.0000 mg | ORAL_CAPSULE | Freq: Three times a day (TID) | ORAL | 1 refills | Status: DC | PRN

## 2020-05-04 NOTE — Telephone Encounter (Signed)
Documented

## 2020-05-04 NOTE — Patient Instructions (Signed)
Start tessalon perles May use claritin.

## 2020-05-04 NOTE — Progress Notes (Signed)
Virtual Visit via Video Note  I connected with@  on 05/05/20 at  4:00 PM EST by a video enabled telemedicine application and verified that I am speaking with the correct person using two identifiers.  Location patient: home Location provider:work  Persons participating in the virtual visit: patient, provider  I discussed the limitations of evaluation and management by telemedicine and the availability of in person appointments. The patient expressed understanding and agreed to proceed.   HPI: Acute visit  Complains of dry cough with occasional clear mucous x 4 days, improving. 'Feels much better than was 3 days ago. ' Coughing all day.  Taking benadryl at night with relief.  Fever, tmax 67F, since resolved 2 days ago. Endorses hoarseness. Covid home test negative 7 days ago.  No ear pain, sore throat, diarrhea, vomiting, loss of  Taste/ smell,sob, wheezing, cp, diarrhea.  covid vaccinated No known covid contact Never smoker  ROS: See pertinent positives and negatives per HPI.    EXAM:  VITALS per patient if applicable: Ht 5' 0.75" (1.543 m)   Wt 133 lb (60.3 kg)   BMI 25.34 kg/m  BP Readings from Last 3 Encounters:  11/25/19 124/70  08/19/19 135/72  07/13/18 112/80   Wt Readings from Last 3 Encounters:  05/04/20 133 lb (60.3 kg)  11/25/19 133 lb 9.6 oz (60.6 kg)  08/19/19 135 lb 9.6 oz (61.5 kg)    GENERAL: alert, oriented, appears well and in no acute distress  HEENT: atraumatic, conjunttiva clear, no obvious abnormalities on inspection of external nose and ears  NECK: normal movements of the head and neck  LUNGS: on inspection no signs of respiratory distress, breathing rate appears normal, no obvious gross SOB, gasping or wheezing  CV: no obvious cyanosis  MS: moves all visible extremities without noticeable abnormality  PSYCH/NEURO: pleasant and cooperative, no obvious depression or anxiety, speech and thought processing grossly intact  ASSESSMENT AND  PLAN:  Discussed the following assessment and plan:  Problem List Items Addressed This Visit      Respiratory   Bronchitis - Primary    Low grade fever. Cough improving. No respiratory distress. Negative covid 7 days ago and advised to consider being retested if symptoms were to worsen. She should however remain in quarantine for 10 days for presumptive viral URI. Advised claritin and tessalon perles for symptoms. She will let us know of any concerns.       Relevant Medications   benzonatate (TESSALON) 100 MG capsule      -we discussed possible serious and likely etiologies, options for evaluation and workup, limitations of telemedicine visit vs in person visit, treatment, treatment risks and precautions. Pt prefers to treat via telemedicine empirically rather then risking or undertaking an in person visit at this moment.  .   I discussed the assessment and treatment plan with the patient. The patient was provided an opportunity to ask questions and all were answered. The patient agreed with the plan and demonstrated an understanding of the instructions.   The patient was advised to call back or seek an in-person evaluation if the symptoms worsen or if the condition fails to improve as anticipated.   Rennie Plowman, FNP

## 2020-05-04 NOTE — Telephone Encounter (Signed)
Patient called to set up a virtual for cough, per office Mychart note. At the time of call  no appointments with any provider available for today, Friday, or the following Monday.

## 2020-05-05 NOTE — Assessment & Plan Note (Signed)
Low grade fever. Cough improving. No respiratory distress. Negative covid 7 days ago and advised to consider being retested if symptoms were to worsen. She should however remain in quarantine for 10 days for presumptive viral URI. Advised claritin and tessalon perles for symptoms. She will let us know of any concerns.

## 2020-05-12 ENCOUNTER — Other Ambulatory Visit: Payer: Self-pay | Admitting: Dermatology

## 2020-05-12 DIAGNOSIS — L219 Seborrheic dermatitis, unspecified: Secondary | ICD-10-CM

## 2020-05-30 ENCOUNTER — Other Ambulatory Visit: Payer: Self-pay

## 2020-05-30 ENCOUNTER — Encounter: Payer: Self-pay | Admitting: Internal Medicine

## 2020-05-30 ENCOUNTER — Ambulatory Visit (INDEPENDENT_AMBULATORY_CARE_PROVIDER_SITE_OTHER): Payer: PPO | Admitting: Internal Medicine

## 2020-05-30 VITALS — BP 120/80 | HR 79 | Temp 98.1°F | Ht 60.75 in | Wt 132.4 lb

## 2020-05-30 DIAGNOSIS — Z0000 Encounter for general adult medical examination without abnormal findings: Secondary | ICD-10-CM | POA: Insufficient documentation

## 2020-05-30 DIAGNOSIS — Z1329 Encounter for screening for other suspected endocrine disorder: Secondary | ICD-10-CM | POA: Diagnosis not present

## 2020-05-30 DIAGNOSIS — Z1389 Encounter for screening for other disorder: Secondary | ICD-10-CM

## 2020-05-30 DIAGNOSIS — E785 Hyperlipidemia, unspecified: Secondary | ICD-10-CM | POA: Diagnosis not present

## 2020-05-30 DIAGNOSIS — Z1231 Encounter for screening mammogram for malignant neoplasm of breast: Secondary | ICD-10-CM

## 2020-05-30 DIAGNOSIS — Z13220 Encounter for screening for lipoid disorders: Secondary | ICD-10-CM

## 2020-05-30 MED ORDER — ATORVASTATIN CALCIUM 10 MG PO TABS: 10.0000 mg | ORAL_TABLET | Freq: Every day | ORAL | 3 refills | Status: DC

## 2020-05-30 NOTE — Progress Notes (Signed)
Cologuard orders placed via online portal.

## 2020-05-30 NOTE — Progress Notes (Signed)
Patient having phlegm build up in the throat. No cough or nasal drainage. Ongoing since beginning of January. Patient had a cough then and was given tessalon Perles which helped the cough but she has since had clear phlegm build up.  She has to force a cough in order to clear her throat and spit this out.

## 2020-05-30 NOTE — Patient Instructions (Addendum)
  Nasal saline  Claritin, allegra, xyzal or zyrtec at night  mucinex dm green label   Message me in 2 weeks if not better with sensation in your throat will send to ENT    Postnasal Drip Postnasal drip is the feeling of mucus going down the back of your throat. Mucus is a slimy substance that moistens and cleans your nose and throat, as well as the air pockets in face bones near your forehead and cheeks (sinuses). Small amounts of mucus pass from your nose and sinuses down the back of your throat all the time. This is normal. When you produce too much mucus or the mucus gets too thick, you can feel it. Some common causes of postnasal drip include:  Having more mucus because of: ? A cold or the flu. ? Allergies. ? Cold air. ? Certain medicines.  Having more mucus that is thicker because of: ? A sinus or nasal infection. ? Dry air. ? A food allergy. Follow these instructions at home: Relieving discomfort  Gargle with a salt-water mixture 3-4 times a day or as needed. To make a salt-water mixture, completely dissolve -1 tsp of salt in 1 cup of warm water.  If the air in your home is dry, use a humidifier to add moisture to the air.  Use a saline spray or container (neti pot) to flush out the nose (nasal irrigation). These methods can help clear away mucus and keep the nasal passages moist.   General instructions  Take over-the-counter and prescription medicines only as told by your health care provider.  Follow instructions from your health care provider about eating or drinking restrictions. You may need to avoid caffeine.  Avoid things that you know you are allergic to (allergens), like dust, mold, pollen, pets, or certain foods.  Drink enough fluid to keep your urine pale yellow.  Keep all follow-up visits as told by your health care provider. This is important. Contact a health care provider if:  You have a fever.  You have a sore throat.  You have difficulty  swallowing.  You have headache.  You have sinus pain.  You have a cough that does not go away.  The mucus from your nose becomes thick and is green or yellow in color.  You have cold or flu symptoms that last more than 10 days. Summary  Postnasal drip is the feeling of mucus going down the back of your throat.  If your health care provider approves, use nasal irrigation or a nasal spray 2?4 times a day.  Avoid things that you know you are allergic to (allergens), like dust, mold, pollen, pets, or certain foods. This information is not intended to replace advice given to you by your health care provider. Make sure you discuss any questions you have with your health care provider. Document Revised: 01/25/2020 Document Reviewed: 01/25/2020 Elsevier Patient Education  2021 [employer].

## 2020-05-30 NOTE — Progress Notes (Signed)
Chief Complaint  Patient presents with  . Follow-up  . Nasal Congestion   Annual  1. Fasting labs due 09/08/20 and mammo 07/28/20  2. C/o URI seen Joycelyn Schmid arnett given tesslon perles but having clear phelgm having to get out of throat since beginning of jan 2022 she took home covid test and negative denies sensation of PND, allergies, cough she had fever 99.3 max for 3 days normal temp was 77 F dx bronchitis 04/2020 and around sick grandkids    Review of Systems  Constitutional: Negative for weight loss.  HENT: Negative for hearing loss, sinus pain and sore throat.        +throat clearing   Eyes: Negative for blurred vision.  Respiratory: Negative for shortness of breath.   Cardiovascular: Negative for chest pain.  Gastrointestinal: Negative for abdominal pain and vomiting.  Musculoskeletal: Negative for falls.  Skin: Negative for rash.  Neurological: Negative for headaches.  Psychiatric/Behavioral: Negative for depression.   Past Medical History:  Diagnosis Date  . Arthritis   . Basal cell carcinoma 12/10/2017   Left alar crease. Micronodular pattern  . Basal cell carcinoma 12/10/2017   Left upper back. Nodular.   . Basal cell carcinoma 12/10/2017   Right medial mid back. Superficial.  . Basal cell carcinoma 12/10/2017   Right lateral mid back. Nodular  . Basal cell carcinoma 12/24/2018   Right mid back. Nodular  . Basal cell carcinoma 12/24/2018   Left anterior shoulder. Nodular  . Breast mass    right  . Cancer ([place])    skin cancer SCC, BCC multiple   . Headache   . History of bronchitis   . History of pneumonia   . Hyperlipidemia    Past Surgical History:  Procedure Laterality Date  . BREAST LUMPECTOMY     right 2016 negative malignancy fibrocystic changes   . COLONOSCOPY    . DILATION AND CURETTAGE OF UTERUS    . JOINT REPLACEMENT     left   . MOHS SURGERY     x2 face   . MOHS SURGERY     L alar 02/04/18   . RADIOACTIVE SEED GUIDED EXCISIONAL BREAST BIOPSY  Right 04/06/2015   Procedure: RADIOACTIVE SEED GUIDED EXCISIONAL RIGHT BREAST BIOPSY;  Surgeon: Stark Klein, MD;  Location: [facility];  Service: General;  Laterality: Right;  . 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 WISDOM TOOTH EXTRACTION     Family History  Problem Relation Age of Onset  . Stroke Mother   . Alzheimer's disease Father    Social History   Socioeconomic History  . Marital status: Widowed    Spouse name: Not on file  . Number of children: Not on file  . Years of education: Not on file  . Highest education level: Not on file  Occupational History  . Not on file  Tobacco Use  . Smoking status: Never Smoker  . Smokeless tobacco: Never Used  Substance and Sexual Activity  . Alcohol use: Yes    Comment: social  . Drug use: No  . Sexual activity: Not Currently  Other Topics Concern  . 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 Acetaminophen (TYLENOL ARTHRITIS PAIN PO) Take by mouth.  . Apoaequorin (PREVAGEN EXTRA STRENGTH PO) Take by mouth.  Marland Kitchen aspirin 81 MG tablet Take 81 mg by mouth daily.  Marland Kitchen atorvastatin (LIPITOR) 10 MG tablet Take 1 tablet (10 mg total) by mouth daily at 6 PM.  . Biotin 10 MG CAPS Take 1,000 mg by mouth daily.   . Calcium Carb-Cholecalciferol ([employer] CALCIUM 600 &  VITAMIN D3 PO) Take by mouth 2 (two) times daily after a meal.  . cholecalciferol (VITAMIN D) 1000 UNITS tablet Take 1,000 Units by mouth daily.  Marland Kitchen ketoconazole (NIZORAL) 2 % shampoo MASSAGE INTO SCALP AND LET SIT 10 MINUTES BEFORE WASHING OUT. USE 2-3 TIMES A WEEK.  Javier Docker Oil 1000 MG CAPS Take 3 capsules by mouth daily.   . Multiple Vitamin (MULTIVITAMIN WITH MINERALS) TABS tablet Take 1 tablet by mouth daily.  Marland Kitchen triamcinolone ointment (KENALOG) 0.1 % Apply twice daily to affected areas up to 2 weeks as needed for itch. Avoid applying to face, groin, and axilla. Use as directed. Risk of skin atrophy with long-term use reviewed.  . TURMERIC PO Take by mouth daily.  . vitamin C (ASCORBIC ACID) 250 MG tablet Take 500 mg by mouth daily. Taking two tablets daily   Allergies  Allergen Reactions  . Aspartame And Phenylalanine   . Meloxicam Hives and Diarrhea  . Other Itching    Sunscreens Lotions and Tanning products   No results found for this or any previous visit (from the past 2160 hour(s)). Objective  Body mass index is 25.22 kg/m. Wt Readings from Last 3 Encounters:  05/30/20 132 lb 6.4 oz (60.1 kg)  05/04/20 133 lb (60.3 kg)  11/25/19 133 lb 9.6 oz (60.6 kg)   Temp Readings from Last 3 Encounters:  05/30/20 98.1 F (36.7 C) (Oral)  11/25/19 98.5 F (36.9 C) (Oral)  08/19/19 (!) 97.5 F (36.4 C) (Temporal)   BP Readings from Last 3 Encounters:  05/30/20 120/80  11/25/19 124/70  08/19/19 135/72   Pulse Readings from Last 3 Encounters:  05/30/20 79  11/25/19 72  08/19/19 70    Physical Exam Vitals and nursing note reviewed.  Constitutional:      Appearance: Normal appearance. She is well-developed and well-groomed.  HENT:     Head: Normocephalic and atraumatic.  Eyes:     Conjunctiva/sclera: Conjunctivae normal.     Pupils: Pupils are equal, round, and reactive to light.  Cardiovascular:     Rate and Rhythm: Normal rate and regular rhythm.     Heart sounds:  Normal heart sounds. No murmur heard.   Pulmonary:     Effort: Pulmonary effort is normal.     Breath sounds: Normal breath sounds.  Abdominal:     General: Abdomen is flat. Bowel sounds are normal.     Tenderness: There is no abdominal tenderness.  Skin:    General: Skin is warm and dry.  Neurological:     General: No focal deficit present.     Mental  Status: She is alert and oriented to person, place, and time. Mental status is at baseline.     Gait: Gait normal.  Psychiatric:        Attention and Perception: Attention and perception normal.        Mood and Affect: Mood and affect normal.        Speech: Speech normal.        Behavior: Behavior normal. Behavior is cooperative.        Thought Content: Thought content normal.        Cognition and Memory: Cognition and memory normal.        Judgment: Judgment normal.     Assessment  Plan  Annual physical exam - Plan: Lipid panel, Comprehensive metabolic panel, CBC with Differential/Platelet, TSH, Urinalysis, Routine w reflex microscopic Had flu shotutd shingrix2/2 utd Tdap utd prevnar and pna 25 covid 3/3  Out of age window pap 07/29/19 negmammo ordered DEXA 05/28/2018 osteopenia repeat in 2023  cologuard1/15/19 negate ordered   H/o skin SCC, BCC multiple f/u Dr. Lajuana Matte seen fall 2020 mohs 3rd to nose 01/2018 Dr. Elvera Bicker [facility]  [facility] left alar nose mohs on 02/04/18 Dr. Manley Mason, derm f/u 05/2018 appt derm 01/2020 f/u Q6 months   Cont work out [facility] 5x per week   Will refer ENT if still has sensation in throat upcoming in 2 weeks as of 05/30/20 Provider: Dr. Olivia Mackie McLean-Scocuzza-Internal Medicine

## 2020-06-08 DIAGNOSIS — Z1211 Encounter for screening for malignant neoplasm of colon: Secondary | ICD-10-CM | POA: Diagnosis not present

## 2020-06-08 DIAGNOSIS — Z1212 Encounter for screening for malignant neoplasm of rectum: Secondary | ICD-10-CM | POA: Diagnosis not present

## 2020-06-14 LAB — COLOGUARD
COLOGUARD: NEGATIVE
Cologuard: NEGATIVE

## 2020-06-20 ENCOUNTER — Telehealth: Payer: Self-pay | Admitting: Internal Medicine

## 2020-06-20 ENCOUNTER — Encounter: Payer: Self-pay | Admitting: Internal Medicine

## 2020-06-20 NOTE — Telephone Encounter (Signed)
Please advise pt cologuard 06/08/20 negative will repeat every 3 years until age 75

## 2020-06-21 ENCOUNTER — Telehealth: Payer: Self-pay | Admitting: Internal Medicine

## 2020-06-21 NOTE — Telephone Encounter (Signed)
Patient called in and was informed

## 2020-06-26 ENCOUNTER — Ambulatory Visit (INDEPENDENT_AMBULATORY_CARE_PROVIDER_SITE_OTHER): Payer: PPO

## 2020-06-26 VITALS — Ht 60.75 in | Wt 132.0 lb

## 2020-06-26 DIAGNOSIS — Z0000 Encounter for general adult medical examination without abnormal findings: Secondary | ICD-10-CM

## 2020-06-26 NOTE — Patient Instructions (Addendum)
Andrea Spencer , Thank you for taking time to come for your Medicare Wellness Visit. I appreciate your ongoing commitment to your health goals. Please review the following plan we discussed and let me know if I can assist you in the future.   These are the goals we discussed: Goals    . Increase physical activity     Line dancing       This is a list of the screening recommended for you and due dates:  Health Maintenance  Topic Date Due  . COVID-19 Vaccine (4 - Booster for Pfizer series) 07/26/2020  . Mammogram  07/28/2021  . Cologuard (Stool DNA test)  06/09/2023  . Tetanus Vaccine  12/30/2028  . Flu Shot  Completed  . DEXA scan (bone density measurement)  Completed  .  Hepatitis C: One time screening is recommended by Center for Disease Control  (CDC) for  adults born from [birth year] through 1963.   Completed  . Pneumonia vaccines  Completed     Immunizations Immunization History  Administered Date(s) Administered  . Fluad Quad(high Dose 65+) 12/29/2018  . Influenza, High Dose Seasonal PF 12/26/2017  . Influenza-Unspecified 12/17/2019  . PFIZER(Purple Top)SARS-COV-2 Vaccination 05/24/2019, 06/14/2019, 01/27/2020  . Pneumococcal Conjugate-13 06/18/2013  . Pneumococcal Polysaccharide-23 12/26/2017  . Tdap 12/31/2018  . Zoster Recombinat (Shingrix) 12/26/2017, 05/15/2018   Keep all routine maintenance appointments.  Next scheduled lab 09/11/20  Follow up 11/29/20   Advanced directives: End of life planning; Advance aging; Advanced directives discussed.  Copy of current HCPOA/Living Will requested.    Conditions/risks identified: none new.   Follow up in one year for your annual wellness visit.   Preventive Care 2 Years and Older, Female Preventive care refers to lifestyle choices and visits with your health care provider that can promote health and wellness. What does preventive care include?  A yearly physical exam. This is also called an annual well check.  Dental exams  once or twice a year.  Routine eye exams. Ask your health care provider how often you should have your eyes checked.  Personal lifestyle choices, including:  Daily care of your teeth and gums.  Regular physical activity.  Eating a healthy diet.  Avoiding tobacco and drug use.  Limiting alcohol use.  Practicing safe sex.  Taking low-dose aspirin every day.  Taking vitamin and mineral supplements as recommended by your health care provider. What happens during an annual well check? The services and screenings done by your health care provider during your annual well check will depend on your age, overall health, lifestyle risk factors, and family history of disease. Counseling  Your health care provider may ask you questions about your:  Alcohol use.  Tobacco use.  Drug use.  Emotional well-being.  Home and relationship well-being.  Sexual activity.  Eating habits.  History of falls.  Memory and ability to understand (cognition).  Work and work Statistician.  Reproductive health. Screening  You may have the following tests or measurements:  Height, weight, and BMI.  Blood pressure.  Lipid and cholesterol levels. These may be checked every 5 years, or more frequently if you are over 43 years old.  Skin check.  Lung cancer screening. You may have this screening every year starting at age 45 if you have a 30-pack-year history of smoking and currently smoke or have quit within the past 15 years.  Fecal occult blood test (FOBT) of the stool. You may have this test every year starting at age 78.  Flexible sigmoidoscopy or colonoscopy. You may have a sigmoidoscopy every 5 years or a colonoscopy every 10 years starting at age 57.  Hepatitis C blood test.  Hepatitis B blood test.  Sexually transmitted disease (STD) testing.  Diabetes screening. This is done by checking your blood sugar (glucose) after you have not eaten for a while (fasting). You may have  this done every 1-3 years.  Bone density scan. This is done to screen for osteoporosis. You may have this done starting at age 28.  Mammogram. This may be done every 1-2 years. Talk to your health care provider about how often you should have regular mammograms. Talk with your health care provider about your test results, treatment options, and if necessary, the need for more tests. Vaccines  Your health care provider may recommend certain vaccines, such as:  Influenza vaccine. This is recommended every year.  Tetanus, diphtheria, and acellular pertussis (Tdap, Td) vaccine. You may need a Td booster every 10 years.  Zoster vaccine. You may need this after age 66.  Pneumococcal 13-valent conjugate (PCV13) vaccine. One dose is recommended after age 60.  Pneumococcal polysaccharide (PPSV23) vaccine. One dose is recommended after age 90. Talk to your health care provider about which screenings and vaccines you need and how often you need them. This information is not intended to replace advice given to you by your health care provider. Make sure you discuss any questions you have with your health care provider. Document Released: 05/12/2015 Document Revised: 01/03/2016 Document Reviewed: 02/14/2015 Elsevier Interactive Patient Education  2017 [employer] Prevention in the Home Falls can cause injuries. They can happen to people of all ages. There are many things you can do to make your home safe and to help prevent falls. What can I do on the outside of my home?  Regularly fix the edges of walkways and driveways and fix any cracks.  Remove anything that might make you trip as you walk through a door, such as a raised step or threshold.  Trim any bushes or trees on the path to your home.  Use bright outdoor lighting.  Clear any walking paths of anything that might make someone trip, such as rocks or tools.  Regularly check to see if handrails are loose or broken. Make sure  that both sides of any steps have handrails.  Any raised decks and porches should have guardrails on the edges.  Have any leaves, snow, or ice cleared regularly.  Use sand or salt on walking paths during winter.  Clean up any spills in your garage right away. This includes oil or grease spills. What can I do in the bathroom?  Use night lights.  Install grab bars by the toilet and in the tub and shower. Do not use towel bars as grab bars.  Use non-skid mats or decals in the tub or shower.  If you need to sit down in the shower, use a plastic, non-slip stool.  Keep the floor dry. Clean up any water that spills on the floor as soon as it happens.  Remove soap buildup in the tub or shower regularly.  Attach bath mats securely with double-sided non-slip rug tape.  Do not have throw rugs and other things on the floor that can make you trip. What can I do in the bedroom?  Use night lights.  Make sure that you have a light by your bed that is easy to reach.  Do not use any sheets or blankets  that are too big for your bed. They should not hang down onto the floor.  Have a firm chair that has side arms. You can use this for support while you get dressed.  Do not have throw rugs and other things on the floor that can make you trip. What can I do in the kitchen?  Clean up any spills right away.  Avoid walking on wet floors.  Keep items that you use a lot in easy-to-reach places.  If you need to reach something above you, use a strong step stool that has a grab bar.  Keep electrical cords out of the way.  Do not use floor polish or wax that makes floors slippery. If you must use wax, use non-skid floor wax.  Do not have throw rugs and other things on the floor that can make you trip. What can I do with my stairs?  Do not leave any items on the stairs.  Make sure that there are handrails on both sides of the stairs and use them. Fix handrails that are broken or loose. Make  sure that handrails are as long as the stairways.  Check any carpeting to make sure that it is firmly attached to the stairs. Fix any carpet that is loose or worn.  Avoid having throw rugs at the top or bottom of the stairs. If you do have throw rugs, attach them to the floor with carpet tape.  Make sure that you have a light switch at the top of the stairs and the bottom of the stairs. If you do not have them, ask someone to add them for you. What else can I do to help prevent falls?  Wear shoes that:  Do not have high heels.  Have rubber bottoms.  Are comfortable and fit you well.  Are closed at the toe. Do not wear sandals.  If you use a stepladder:  Make sure that it is fully opened. Do not climb a closed stepladder.  Make sure that both sides of the stepladder are locked into place.  Ask someone to hold it for you, if possible.  Clearly mark and make sure that you can see:  Any grab bars or handrails.  First and last steps.  Where the edge of each step is.  Use tools that help you move around (mobility aids) if they are needed. These include:  Canes.  Walkers.  Scooters.  Crutches.  Turn on the lights when you go into a dark area. Replace any light bulbs as soon as they burn out.  Set up your furniture so you have a clear path. Avoid moving your furniture around.  If any of your floors are uneven, fix them.  If there are any pets around you, be aware of where they are.  Review your medicines with your doctor. Some medicines can make you feel dizzy. This can increase your chance of falling. Ask your doctor what other things that you can do to help prevent falls. This information is not intended to replace advice given to you by your health care provider. Make sure you discuss any questions you have with your health care provider. Document Released: 02/09/2009 Document Revised: 09/21/2015 Document Reviewed: 05/20/2014 Elsevier Interactive Patient Education   2017 [employer].

## 2020-06-26 NOTE — Progress Notes (Addendum)
Subjective:   Andrea Spencer is a 75 y.o. female who presents for Medicare Annual (Subsequent) preventive examination.  Review of Systems    No ROS.  Medicare Wellness Virtual Visit.     Cardiac Risk Factors include: advanced age (>32men, >62 women)     Objective:    Today's Vitals   06/26/20 1259  Weight: 132 lb (59.9 kg)  Height: 5' 0.75" (1.543 m)   Body mass index is 25.15 kg/m.  Advanced Directives 06/26/2020 04/01/2019 03/23/2018 01/18/2016 04/05/2015 03/17/2015  Does Patient Have a Medical Advance Directive? Yes No Yes No No No  Type of Paramedic of [place];Living will - [place];Living will - - -  Does patient want to make changes to medical advance directive? No - Patient declined - No - Patient declined - - -  Copy of [place] in Chart? No - copy requested - No - copy requested - - -  Would patient like information on creating a medical advance directive? - No - Patient declined - No - patient declined information No - patient declined information -    Current Medications (verified) Outpatient Encounter Medications as of 06/26/2020  Medication Sig  . Acetaminophen (TYLENOL ARTHRITIS PAIN PO) Take by mouth.  . Apoaequorin (PREVAGEN EXTRA STRENGTH PO) Take by mouth.  Marland Kitchen aspirin 81 MG tablet Take 81 mg by mouth daily.  Marland Kitchen atorvastatin (LIPITOR) 10 MG tablet Take 1 tablet (10 mg total) by mouth daily at 6 PM.  . benzonatate (TESSALON) 100 MG capsule Take 1 capsule (100 mg total) by mouth 3 (three) times daily as needed for cough. (Patient not taking: Reported on 05/30/2020)  . Biotin 10 MG CAPS Take 1,000 mg by mouth daily.   . Calcium Carb-Cholecalciferol ([employer] CALCIUM 600 & VITAMIN D3 PO) Take by mouth 2 (two) times daily after a meal.  . cholecalciferol (VITAMIN D) 1000 UNITS tablet Take 1,000 Units by mouth daily.  Marland Kitchen ketoconazole (NIZORAL) 2 % shampoo MASSAGE INTO SCALP AND LET SIT 10 MINUTES BEFORE  WASHING OUT. USE 2-3 TIMES A WEEK.  Javier Docker Oil 1000 MG CAPS Take 3 capsules by mouth daily.   . Multiple Vitamin (MULTIVITAMIN WITH MINERALS) TABS tablet Take 1 tablet by mouth daily.  Marland Kitchen triamcinolone ointment (KENALOG) 0.1 % Apply twice daily to affected areas up to 2 weeks as needed for itch. Avoid applying to face, groin, and axilla. Use as directed. Risk of skin atrophy with long-term use reviewed.  . TURMERIC PO Take by mouth daily.  . vitamin C (ASCORBIC ACID) 250 MG tablet Take 500 mg by mouth daily. Taking two tablets daily   No facility-administered encounter medications on file as of 06/26/2020.    Allergies (verified) Aspartame and phenylalanine, Meloxicam, and Other   History: Past Medical History:  Diagnosis Date  . Arthritis   . Basal cell carcinoma 12/10/2017   Left alar crease. Micronodular pattern  . Basal cell carcinoma 12/10/2017   Left upper back. Nodular.   . Basal cell carcinoma 12/10/2017   Right medial mid back. Superficial.  . Basal cell carcinoma 12/10/2017   Right lateral mid back. Nodular  . Basal cell carcinoma 12/24/2018   Right mid back. Nodular  . Basal cell carcinoma 12/24/2018   Left anterior shoulder. Nodular  . Breast mass    right  . Cancer ([place])    skin cancer SCC, BCC multiple   . Headache   . History of bronchitis   .  History of pneumonia   . Hyperlipidemia    Past Surgical History:  Procedure Laterality Date  . BREAST LUMPECTOMY     right 2016 negative malignancy fibrocystic changes   . COLONOSCOPY    . DILATION AND CURETTAGE OF UTERUS    . JOINT REPLACEMENT     left   . MOHS SURGERY     x2 face   . MOHS SURGERY     L alar 02/04/18   . RADIOACTIVE SEED GUIDED EXCISIONAL BREAST BIOPSY Right 04/06/2015   Procedure: RADIOACTIVE SEED GUIDED EXCISIONAL RIGHT BREAST BIOPSY;  Surgeon: Stark Klein, MD;  Location: [facility];  Service: General;  Laterality: Right;  . 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 WISDOM TOOTH EXTRACTION     Family History  Problem Relation Age of Onset  . Stroke Mother   . Alzheimer's disease Father    Social History   Socioeconomic History  . Marital status: Widowed    Spouse name: Not on file  . Number of children: Not on file  . Years of education: Not on file  . Highest education level: Not on file  Occupational History  . Not on file  Tobacco Use  . Smoking status: Never Smoker  . Smokeless tobacco: Never Used  Substance and Sexual Activity  . Alcohol use: Yes    Comment: social  . Drug use: No  . Sexual activity: Not Currently  Other Topics Concern  . 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 Difficulty of Paying Living Expenses: Not hard at all  Food Insecurity: No Food Insecurity  . Worried About Charity fundraiser in the Last Year: Never true  . Ran Out of Food in the Last Year: Never true  Transportation Needs: No Transportation Needs  . Lack of Transportation (Medical): No  . Lack of Transportation (Non-Medical): No  Physical Activity: Insufficiently Active  . Days of Exercise per Week: 4 days  . Minutes of Exercise per Session: 30 min  Stress: No Stress Concern Present  . Feeling of Stress : Not at all  Social Connections: Unknown  . Frequency of Communication with Friends and Family: More than three times a week  . Frequency of Social Gatherings with Friends and Family: More than three times a week  . Attends Religious Services: Not on file  . Active Member of Clubs or Organizations: Not  on file  . Attends Archivist Meetings: Not on file  . Marital Status: Not on file    Tobacco Counseling Counseling given: Not Answered   Clinical Intake:  Pre-visit preparation completed: Yes        Diabetes: No  How often do you need to have someone help you when you read instructions, pamphlets, or other written materials from your doctor or pharmacy?: 1 - Never    Interpreter Needed?: No      Activities of Daily Living In your present state of  health, do you have any difficulty performing the following activities: 06/26/2020  Hearing? N  Vision? N  Difficulty concentrating or making decisions? N  Walking or climbing stairs? N  Dressing or bathing? N  Doing errands, shopping? N  Preparing Food and eating ? N  Using the Toilet? N  In the past six months, have you accidently leaked urine? N  Do you have problems with loss of bowel control? N  Managing your Medications? N  Managing your Finances? N  Housekeeping or managing your Housekeeping? N  Some recent data might be hidden    Patient Care Team: McLean-Scocuzza, Nino Glow, MD as PCP - General (Internal Medicine)  Indicate any recent Medical Services you may have received from other than [facility] providers in the past year (date may be approximate).     Assessment:   This is a routine wellness examination for Andrea Spencer.  I connected with Andrea Spencer today by telephone and verified that I am speaking with the correct person using two identifiers. Location patient: home Location provider: work Persons participating in the virtual visit: patient, Marine scientist.    I discussed the limitations, risks, security and privacy concerns of performing an evaluation and management service by telephone and the availability of in person appointments. The patient expressed understanding and verbally consented to this telephonic visit.    Interactive audio and video telecommunications were attempted between this provider and  patient, however failed, due to patient having technical difficulties OR patient did not have access to video capability.  We continued and completed visit with audio only.  Some vital signs may be absent or patient reported.   Hearing/Vision screen  Hearing Screening   125Hz  250Hz  500Hz  1000Hz  2000Hz  3000Hz  4000Hz  6000Hz  8000Hz   Right ear:           Left ear:           Comments: Patient is able to hear conversational tones without difficulty.  No issues reported.  Vision Screening Comments: Followed by [facility] Wears corrective lenses Cataract extraction, bilateral Visual acuity not assessed, virtual visit.  They have seen their ophthalmologist in the last 12 months.     Dietary issues and exercise activities discussed: Current Exercise Habits: Home exercise routine, Intensity: Mild  Healthy diet Good water intake  Goals    . Increase physical activity     Continue to walk for exercise      Depression Screen PHQ 2/9 Scores 06/26/2020 11/25/2019 08/19/2019 04/01/2019 03/23/2018 01/01/2018  PHQ - 2 Score 0 0 0 0 0 0    Fall Risk Fall Risk  06/26/2020 05/30/2020 11/25/2019 08/19/2019 04/01/2019  Falls in the past year? 0 0 0 0 0  Comment - - - - -  Number falls in past yr: 0 0 0 0 -  Injury with Fall? 0 0 0 0 -  Risk for fall due to : - - No Fall Risks - -  Follow up Falls evaluation completed Falls evaluation completed Falls evaluation completed Falls evaluation completed Education provided;Falls prevention discussed    FALL RISK PREVENTION PERTAINING TO THE HOME: Handrails in use when climbing stairs? Yes Home free of loose throw rugs in walkways, pet beds, electrical cords, etc? Yes  Adequate lighting in your home to reduce risk of falls? Yes   ASSISTIVE DEVICES UTILIZED TO PREVENT FALLS: Life alert? No  Use of a cane, walker or w/c? No   TIMED UP AND GO: Was the test performed? No . Virtual visit.  Cognitive Function: Patient is alert and oriented x3.   Denies difficulty focusing, making decisions, memory loss. Enjoys reading and is part of a book club.  MMSE//6CIT deferred. Normal by direct communication/observation.   MMSE - Mini Mental State Exam 03/23/2018  Orientation to time 5  Orientation to Place 5  Registration 3  Attention/ Calculation 5  Recall 3  Language- name 2 objects 2  Language- repeat 1  Language- follow 3 step command 3  Language- read & follow direction 1  Write a sentence 1  Copy design 1  Total score 30     6CIT Screen 04/01/2019  What Year? 0 points  What month? 0 points  What time? 0 points  Count back from 20 0 points  Months in reverse 0 points  Repeat phrase 0 points  Total Score 0    Immunizations Immunization History  Administered Date(s) Administered  . Fluad Quad(high Dose 65+) 12/29/2018  . Influenza, High Dose Seasonal PF 12/26/2017  . Influenza-Unspecified 12/17/2019  . PFIZER(Purple Top)SARS-COV-2 Vaccination 05/24/2019, 06/14/2019, 01/27/2020  . Pneumococcal Conjugate-13 06/18/2013  . Pneumococcal Polysaccharide-23 12/26/2017  . Tdap 12/31/2018  . Zoster Recombinat (Shingrix) 12/26/2017, 05/15/2018     Health Maintenance There are no preventive care reminders to display for this patient.  Health Maintenance  Topic Date Due  . COVID-19 Vaccine (4 - Booster for Pfizer series) 07/26/2020  . MAMMOGRAM  07/28/2021  . Fecal DNA (Cologuard)  06/09/2023  . TETANUS/TDAP  12/30/2028  . INFLUENZA VACCINE  Completed  . DEXA SCAN  Completed  . Hepatitis C Screening  Completed  . PNA vac Low Risk Adult  Completed   Colorectal cancer screening: Type of screening: Cologuard. Completed 05/13/17. Repeat every 3 years . Due 06/2020. Deferred.   Mammogram status: Completed 07/29/19. Scheduled for 2022. Marland Kitchen Repeat every year   Lung Cancer Screening: (Low Dose CT Chest recommended if Age 92-80 years, 30 pack-year currently smoking OR have quit w/in 15years.) does not qualify.   Vision  Screening: Recommended annual ophthalmology exams for early detection of glaucoma and other disorders of the eye. Is the patient up to date with their annual eye exam?  Yes   Dental Screening: Recommended annual dental exams for proper oral hygiene.  Community Resource Referral / Chronic Care Management: CRR required this visit?  No   CCM required this visit?  No      Plan:   Keep all routine maintenance appointments.  Next scheduled lab 09/11/20  Follow up 11/29/20   I have personally reviewed and noted the following in the patient's chart:   . Medical and social history . Use of alcohol, tobacco or illicit drugs  . Current medications and supplements . Functional ability and status . Nutritional status . Physical activity . Advanced directives . List of other physicians . Hospitalizations, surgeries, and ER visits in previous 12 months . Vitals . Screenings to include cognitive, depression, and falls . Referrals and appointments  In addition, I have reviewed and discussed with patient certain preventive protocols, quality metrics, and best practice recommendations. A written personalized care plan for preventive services as well as general preventive health recommendations were provided to patient via mychart.     Varney Biles, LPN   06/20/9796     Agree with plan. Mable Paris, NP

## 2020-06-28 ENCOUNTER — Other Ambulatory Visit: Payer: Self-pay

## 2020-06-28 ENCOUNTER — Ambulatory Visit: Payer: PPO | Admitting: Dermatology

## 2020-06-28 DIAGNOSIS — L820 Inflamed seborrheic keratosis: Secondary | ICD-10-CM

## 2020-06-28 DIAGNOSIS — L570 Actinic keratosis: Secondary | ICD-10-CM

## 2020-06-28 DIAGNOSIS — L821 Other seborrheic keratosis: Secondary | ICD-10-CM | POA: Diagnosis not present

## 2020-06-28 DIAGNOSIS — L219 Seborrheic dermatitis, unspecified: Secondary | ICD-10-CM

## 2020-06-28 DIAGNOSIS — L309 Dermatitis, unspecified: Secondary | ICD-10-CM

## 2020-06-28 MED ORDER — KETOCONAZOLE 2 % EX SHAM: MEDICATED_SHAMPOO | CUTANEOUS | 11 refills | Status: DC

## 2020-06-28 NOTE — Patient Instructions (Signed)
Cryotherapy Aftercare  . Wash gently with soap and water everyday.   Marland Kitchen Apply Vaseline and Band-Aid daily until healed.  Prior to procedure, discussed risks of blister formation, small wound, skin dyspigmentation, or rare scar following cryotherapy.    Topical steroids (such as triamcinolone, fluocinolone, fluocinonide, mometasone, clobetasol, halobetasol, betamethasone, hydrocortisone) can cause thinning and lightening of the skin if they are used for too long in the same area. Your physician has selected the right strength medicine for your problem and area affected on the body. Please use your medication only as directed by your physician to prevent side effects.

## 2020-06-28 NOTE — Progress Notes (Signed)
Follow-Up Visit   Subjective  Andrea Spencer is a 75 y.o. female who presents for the following: Follow-up (Patient here today for seb derm and eczema follow up. Patient was seen in just over 4 months ago and was given ketoconazole 2% shampoo and fluocinolone oil. She is not using oil but is shampooing about 3 times a week and advises scalp is much improved. Patient was given TMC 0.1% cream for eczema at chest and advises that has improved as well. She is due for FBSE in April. ).  The following portions of the chart were reviewed this encounter and updated as appropriate:   Tobacco  Allergies  Meds  Problems  Med Hx  Surg Hx  Fam Hx      Review of Systems:  No other skin or systemic complaints except as noted in HPI or Assessment and Plan.  Objective  Well appearing patient in no apparent distress; mood and affect are within normal limits.  A focused examination was performed including face, scalp, chest, neck, legs. Relevant physical exam findings are noted in the Assessment and Plan.  Objective  Scalp: Mild erythema of scalp  Objective  chest: Clear today  Objective  Left Dorsal Hand x 1, left neck x 1 (2): Erythematous thin papules/macules with gritty scale.   Objective  left neck x 1, left medial calf x 1 (2): Erythematous keratotic or waxy stuck-on papule or plaque.    Assessment & Plan  Seborrheic dermatitis Scalp  Chronic condition with duration or expected duration over one year. Currently well-controlled.  No cure, only control.  Continue ketoconazole 2% shampoo 2-3 times a week. Massage into scalp and let sit 10 minutes before washing out.        Other Related Medications ketoconazole (NIZORAL) 2 % shampoo  Eczema, unspecified type chest  Chronic condition with duration or expected duration over one year. Currently well-controlled.  Continue gentle skin care.   Continue TMC 0.1% cream twice daily up to 2 weeks at a time as needed  for itch. Avoid applying to face, groin, and axilla. Use as directed. Risk of skin atrophy with long-term use reviewed.    Recommend Gold Bond Rapid Relief Anti-Itch cream up to 3 times per day to areas that are itchy.   Topical steroids (such as triamcinolone, fluocinolone, fluocinonide, mometasone, clobetasol, halobetasol, betamethasone, hydrocortisone) can cause thinning and lightening of the skin if they are used for too long in the same area. Your physician has selected the right strength medicine for your problem and area affected on the body. Please use your medication only as directed by your physician to prevent side effects.   AK (actinic keratosis) (2) Left Dorsal Hand x 1, left neck x 1  Prior to procedure, discussed risks of blister formation, small wound, skin dyspigmentation, or rare scar following cryotherapy.    Destruction of lesion - Left Dorsal Hand x 1, left neck x 1 Complexity: simple   Destruction method: cryotherapy   Informed consent: discussed and consent obtained   Lesion destroyed using liquid nitrogen: Yes   Cryotherapy cycles:  2 Outcome: patient tolerated procedure well with no complications   Post-procedure details: wound care instructions given    Inflamed seborrheic keratosis (2) left neck x 1, left medial calf x 1  Prior to procedure, discussed risks of blister formation, small wound, skin dyspigmentation, or rare scar following cryotherapy.    Destruction of lesion - left neck x 1, left medial calf x 1 Complexity: simple  Destruction method: cryotherapy   Informed consent: discussed and consent obtained   Lesion destroyed using liquid nitrogen: Yes   Cryotherapy cycles:  2 Outcome: patient tolerated procedure well with no complications   Post-procedure details: wound care instructions given    Seborrheic Keratoses - Stuck-on, waxy, tan-brown papules and plaques  - Discussed benign etiology and prognosis. - Observe - Call for any  changes   Return in about 1 month (around 07/29/2020) for TBSE, AK follow up.  Graciella Belton, RMA, am acting as scribe for Forest Gleason, MD .  Documentation: I have reviewed the above documentation for accuracy and completeness, and I agree with the above.  Forest Gleason, MD

## 2020-06-29 ENCOUNTER — Encounter: Payer: Self-pay | Admitting: Dermatology

## 2020-07-31 ENCOUNTER — Ambulatory Visit
Admission: RE | Admit: 2020-07-31 | Discharge: 2020-07-31 | Disposition: A | Discharge status: Home / Self Care | Payer: PPO | Source: Ambulatory Visit | Attending: Internal Medicine | Admitting: Internal Medicine

## 2020-07-31 ENCOUNTER — Other Ambulatory Visit: Payer: Self-pay

## 2020-07-31 DIAGNOSIS — Z1231 Encounter for screening mammogram for malignant neoplasm of breast: Secondary | ICD-10-CM

## 2020-09-06 ENCOUNTER — Ambulatory Visit: Payer: PPO | Admitting: Dermatology

## 2020-09-06 ENCOUNTER — Other Ambulatory Visit: Payer: Self-pay

## 2020-09-06 DIAGNOSIS — L814 Other melanin hyperpigmentation: Secondary | ICD-10-CM

## 2020-09-06 DIAGNOSIS — Z85828 Personal history of other malignant neoplasm of skin: Secondary | ICD-10-CM | POA: Diagnosis not present

## 2020-09-06 DIAGNOSIS — D1800 Hemangioma unspecified site: Secondary | ICD-10-CM | POA: Diagnosis not present

## 2020-09-06 DIAGNOSIS — Z872 Personal history of diseases of the skin and subcutaneous tissue: Secondary | ICD-10-CM | POA: Diagnosis not present

## 2020-09-06 DIAGNOSIS — Z1283 Encounter for screening for malignant neoplasm of skin: Secondary | ICD-10-CM | POA: Diagnosis not present

## 2020-09-06 DIAGNOSIS — L578 Other skin changes due to chronic exposure to nonionizing radiation: Secondary | ICD-10-CM

## 2020-09-06 DIAGNOSIS — L719 Rosacea, unspecified: Secondary | ICD-10-CM

## 2020-09-06 DIAGNOSIS — L821 Other seborrheic keratosis: Secondary | ICD-10-CM

## 2020-09-06 DIAGNOSIS — L570 Actinic keratosis: Secondary | ICD-10-CM

## 2020-09-06 DIAGNOSIS — D229 Melanocytic nevi, unspecified: Secondary | ICD-10-CM

## 2020-09-06 DIAGNOSIS — L820 Inflamed seborrheic keratosis: Secondary | ICD-10-CM

## 2020-09-06 NOTE — Patient Instructions (Addendum)
Recommend ketotifen eye drops  Cryotherapy Aftercare  . Wash gently with soap and water everyday.   Marland Kitchen Apply Vaseline and Band-Aid daily until healed.  Prior to procedure, discussed risks of blister formation, small wound, skin dyspigmentation, or rare scar following cryotherapy.    Rosacea  What is rosacea? Rosacea (say: ro-zay-sha) is a common skin disease that usually begins as a trend of flushing or blushing easily.  As rosacea progresses, a persistent redness in the center of the face will develop and may gradually spread beyond the nose and cheeks to the forehead and chin.  In some cases, the ears, chest, and back could be affected.  Rosacea may appear as tiny blood vessels or small red bumps that occur in crops.  Frequently they can contain pus, and are called "pustules".  If the bumps do not contain pus, they are referred to as "papules".  Rarely, in prolonged, untreated cases of rosacea, the oil glands of the nose and cheeks may become permanently enlarged.  This is called rhinophyma, and is seen more frequently in men.  Signs and Risks In its beginning stages, rosacea tends to come and go, which makes it difficult to recognize.  It can start as intermittent flushing of the face.  Eventually, blood vessels may become permanently visible.  Pustules and papules can appear, but can be mistaken for adult acne.  People of all races, ages, genders and ethnic groups are at risk of developing rosacea.  However, it is more common in women (especially around menopause) and adults with fair skin between the ages of 25 and 43.  Treatment Dermatologists typically recommend a combination of treatments to effectively manage rosacea.  Treatment can improve symptoms and may stop the progression of the rosacea.  Treatment may involve both topical and oral medications.  The tetracycline antibiotics are often used for their anti-inflammatory effect; however, because of the possibility of developing antibiotic  resistance, they should not be used long term at full dose.  For dilated blood vessels the options include electrodessication (uses electric current through a small needle), laser treatment, and cosmetics to hide the redness.   With all forms of treatment, improvement is a slow process, and patients may not see any results for the first 3-4 weeks.  It is very important to avoid the sun and other triggers.  Patients must wear sunscreen daily.  Skin Care Instructions: 1. Cleanse the skin with a mild soap such as CeraVe cleanser, Cetaphil cleanser, or Dove soap once or twice daily as needed. 2. Moisturize with Eucerin Redness Relief Daily Perfecting Lotion (has a subtle green tint), CeraVe Moisturizing Cream, or Oil of Olay Daily Moisturizer with sunscreen every morning and/or night as recommended. 3. Makeup should be "non-comedogenic" (won't clog pores) and be labeled "for sensitive skin". Good choices for cosmetics are: Neutrogena, Almay, and Physician's Formula.  Any product with a green tint tends to offset a red complexion. 4. If your eyes are dry and irritated, use artificial tears 2-3 times per day and cleanse the eyelids daily with baby shampoo.  Have your eyes examined at least every 2 years.  Be sure to tell your eye doctor that you have rosacea. 5. Alcoholic beverages tend to cause flushing of the skin, and may make rosacea worse. 6. Always wear sunscreen, protect your skin from extreme hot and cold temperatures, and avoid spicy foods, hot drinks, and mechanical irritation such as rubbing, scrubbing, or massaging the face.  Avoid harsh skin cleansers, cleansing masks, astringents, and  exfoliation. If a particular product burns or makes your face feel tight, then it is likely to flare your rosacea. 7. If you are having difficulty finding a sunscreen that you can tolerate, you may try switching to a chemical-free sunscreen.  These are ones whose active ingredient is zinc oxide or titanium dioxide  only.  They should also be fragrance free, non-comedogenic, and labeled for sensitive skin. 8. Rosacea triggers may vary from person to person.  There are a variety of foods that have been reported to trigger rosacea.  Some patients find that keeping a diary of what they were doing when they flared helps them avoid triggers.  Recommend daily broad spectrum sunscreen SPF 30+ to sun-exposed areas, reapply every 2 hours as needed. Call for new or changing lesions.  Staying in the shade or wearing long sleeves, sun glasses (UVA+UVB protection) and wide brim hats (4-inch brim around the entire circumference of the hat) are also recommended for sun protection.   Recommend taking Heliocare sun protection supplement daily in sunny weather for additional sun protection. For maximum protection on the sunniest days, you can take up to 2 capsules of regular Heliocare OR take 1 capsule of Heliocare Ultra. For prolonged exposure (such as a full day in the sun), you can repeat your dose of the supplement 4 hours after your first dose. Heliocare can be purchased at [facility] or at [email or web address].    Melanoma ABCDEs  Melanoma is the most dangerous type of skin cancer, and is the leading cause of death from skin disease.  You are more likely to develop melanoma if you:  Have light-colored skin, light-colored eyes, or red or blond hair  Spend a lot of time in the sun  Tan regularly, either outdoors or in a tanning bed  Have had blistering sunburns, especially during childhood  Have a close family member who has had a melanoma  Have atypical moles or large birthmarks  Early detection of melanoma is key since treatment is typically straightforward and cure rates are extremely high if we catch it early.   The first sign of melanoma is often a change in a mole or a new dark spot.  The ABCDE system is a way of remembering the signs of melanoma.  A for asymmetry:  The two halves do not match. B  for border:  The edges of the growth are irregular. C for color:  A mixture of colors are present instead of an even brown color. D for diameter:  Melanomas are usually (but not always) greater than 19mm - the size of a pencil eraser. E for evolution:  The spot keeps changing in size, shape, and color.  Please check your skin once per month between visits. You can use a small mirror in front and a large mirror behind you to keep an eye on the back side or your body.   If you see any new or changing lesions before your next follow-up, please call to schedule a visit.  Please continue daily skin protection including broad spectrum sunscreen SPF 30+ to sun-exposed areas, reapplying every 2 hours as needed when you're outdoors.    If you have any questions or concerns for your doctor, please call our main line at [phone] and press option 4 to reach your doctor's medical assistant. If no one answers, please leave a voicemail as directed and we will return your call as soon as possible. Messages left after 4 pm will be answered the following  business day.   You may also send [place] a message via [place]. We typically respond to MyChart messages within 1-2 business days.  For prescription refills, please ask your pharmacy to contact our office. Our fax number is [phone].  If you have an urgent issue when the clinic is closed that cannot wait until the next business day, you can page your doctor at the number below.    Please note that while we do our best to be available for urgent issues outside of office hours, we are not available 24/7.   If you have an urgent issue and are unable to reach [place], you may choose to seek medical care at your doctor's office, retail clinic, urgent care center, or emergency room.  If you have a medical emergency, please immediately call 911 or go to the emergency department.  Pager Numbers  - Dr. Nehemiah Massed: [phone]  - Dr. Laurence Ferrari: [phone]  - Dr.  Nicole Kindred: [phone]  In the event of inclement weather, please call our main line at [phone] for an update on the status of any delays or closures.  Dermatology Medication Tips: Please keep the boxes that topical medications come in in order to help keep track of the instructions about where and how to use these. Pharmacies typically print the medication instructions only on the boxes and not directly on the medication tubes.   If your medication is too expensive, please contact our office at [phone] option 4 or send [place] a message through [place].   We are unable to tell what your co-pay for medications will be in advance as this is different depending on your insurance coverage. However, we may be able to find a substitute medication at lower cost or fill out paperwork to get insurance to cover a needed medication.   If a prior authorization is required to get your medication covered by your insurance company, please allow [place] 1-2 business days to complete this process.  Drug prices often vary depending on where the prescription is filled and some pharmacies may offer cheaper prices.  The website www.goodrx.com contains coupons for medications through different pharmacies. The prices here do not account for what the cost may be with help from insurance (it may be cheaper with your insurance), but the website can give you the price if you did not use any insurance.  - You can print the associated coupon and take it with your prescription to the pharmacy.  - You may also stop by our office during regular business hours and pick up a GoodRx coupon card.  - If you need your prescription sent electronically to a different pharmacy, notify our office through [facility] or by phone at [phone] option 4.

## 2020-09-06 NOTE — Progress Notes (Signed)
Follow-Up Visit   Subjective  Andrea Spencer is a 75 y.o. female who presents for the following: FBSE (Patient here for full body skin exam and skin cancer screening. Patient with hx of BCC and AK's. Patient has 2 spots that have just come up at left axilla.) and Follow-up (Patient also here today to recheck AK's treated at last visit at left neck and left dorsal hand. ).  Spot at left shin sometimes itchy for patient.   Pt has redness at face.  The following portions of the chart were reviewed this encounter and updated as appropriate:   Tobacco  Allergies  Meds  Problems  Med Hx  Surg Hx  Fam Hx      Review of Systems:  No other skin or systemic complaints except as noted in HPI or Assessment and Plan.  Objective  Well appearing patient in no apparent distress; mood and affect are within normal limits.  A full examination was performed including scalp, head, eyes, ears, nose, lips, neck, chest, axillae, abdomen, back, buttocks, bilateral upper extremities, bilateral lower extremities, hands, feet, fingers, toes, fingernails, and toenails. All findings within normal limits unless otherwise noted below.  Objective  Left shin x 1, left axilla x 5 (6): Erythematous keratotic or waxy stuck-on papule or plaque.   Objective  Face: Mid face erythema  Objective  upper cutaneous lip x 3 (3): Erythematous thin papules/macules with gritty scale.    Assessment & Plan  Inflamed seborrheic keratosis (6) Left shin x 1, left axilla x 5  Prior to procedure, discussed risks of blister formation, small wound, skin dyspigmentation, or rare scar following cryotherapy.    Destruction of lesion - Left shin x 1, left axilla x 5  Destruction method: cryotherapy   Informed consent: discussed and consent obtained   Lesion destroyed using liquid nitrogen: Yes   Cryotherapy cycles:  2 Outcome: patient tolerated procedure well with no complications   Post-procedure details: wound  care instructions given    Rosacea Face  Rosacea is a chronic progressive skin condition usually affecting the face of adults, causing redness and/or acne bumps. It is treatable but not curable. It sometimes affects the eyes (ocular rosacea) as well. It may respond to topical and/or systemic medication and can flare with stress, sun exposure, alcohol, exercise and some foods.  Daily application of broad spectrum spf 30+ sunscreen to face is recommended to reduce flares.  Erythematotelangiectatic type - Recommend BBL to treat redness  For temporary redness reduction Mix 1 bottle of Afrin original in 1 bottle of Cerave PM, shake well, and apply 1 hour before you want redness to be improved in the morning  Discussed prescription Rhofade - patient defers.     AK (actinic keratosis) (3) upper cutaneous lip x 3  Prior to procedure, discussed risks of blister formation, small wound, skin dyspigmentation, or rare scar following cryotherapy.    Destruction of lesion - upper cutaneous lip x 3  Destruction method: cryotherapy   Informed consent: discussed and consent obtained   Lesion destroyed using liquid nitrogen: Yes   Cryotherapy cycles:  2 Outcome: patient tolerated procedure well with no complications   Post-procedure details: wound care instructions given     Lentigines - Scattered tan macules - Due to sun exposure - Benign-appering, observe - Recommend daily broad spectrum sunscreen SPF 30+ to sun-exposed areas, reapply every 2 hours as needed. - Call for any changes  Seborrheic Keratoses - Stuck-on, waxy, tan-brown papules and/or plaques  -  Benign-appearing - Discussed benign etiology and prognosis. - Observe - Call for any changes  Melanocytic Nevi - Tan-brown and/or pink-flesh-colored symmetric macules and papules - Benign appearing on exam today - Observation - Call clinic for new or changing moles - Recommend daily use of broad spectrum spf 30+ sunscreen to  sun-exposed areas.   Hemangiomas - Red papules - Discussed benign nature - Observe - Call for any changes  Actinic Damage - Chronic condition, secondary to cumulative UV/sun exposure - diffuse scaly erythematous macules with underlying dyspigmentation - Recommend daily broad spectrum sunscreen SPF 30+ to sun-exposed areas, reapply every 2 hours as needed.  - Staying in the shade or wearing long sleeves, sun glasses (UVA+UVB protection) and wide brim hats (4-inch brim around the entire circumference of the hat) are also recommended for sun protection.  - Call for new or changing lesions.  Skin cancer screening performed today.  History of Basal Cell Carcinoma of the Skin - No evidence of recurrence today - Recommend regular full body skin exams - Recommend daily broad spectrum sunscreen SPF 30+ to sun-exposed areas, reapply every 2 hours as needed.  - Call if any new or changing lesions are noted between office visits  History of PreCancerous Actinic Keratosis  - site(s) of PreCancerous Actinic Keratosis clear today. - these may recur and new lesions may form requiring treatment to prevent transformation into skin cancer - observe for new or changing spots and contact [place] for appointment if occur - photoprotection with sun protective clothing; sunglasses and broad spectrum sunscreen with SPF of at least 30 + and frequent self skin exams recommended - yearly exams by a dermatologist recommended for persons with history of PreCancerous Actinic Keratoses    Return in about 6 months (around 03/09/2021) for TBSE.  Graciella Belton, RMA, am acting as scribe for Forest Gleason, MD .  Documentation: I have reviewed the above documentation for accuracy and completeness, and I agree with the above.  Forest Gleason, MD

## 2020-09-11 ENCOUNTER — Other Ambulatory Visit (INDEPENDENT_AMBULATORY_CARE_PROVIDER_SITE_OTHER): Payer: PPO

## 2020-09-11 ENCOUNTER — Other Ambulatory Visit: Payer: Self-pay

## 2020-09-11 DIAGNOSIS — E785 Hyperlipidemia, unspecified: Secondary | ICD-10-CM | POA: Diagnosis not present

## 2020-09-11 DIAGNOSIS — Z1389 Encounter for screening for other disorder: Secondary | ICD-10-CM | POA: Diagnosis not present

## 2020-09-11 DIAGNOSIS — Z13220 Encounter for screening for lipoid disorders: Secondary | ICD-10-CM | POA: Diagnosis not present

## 2020-09-11 DIAGNOSIS — Z0000 Encounter for general adult medical examination without abnormal findings: Secondary | ICD-10-CM | POA: Diagnosis not present

## 2020-09-11 DIAGNOSIS — Z1329 Encounter for screening for other suspected endocrine disorder: Secondary | ICD-10-CM

## 2020-09-11 LAB — COMPREHENSIVE METABOLIC PANEL
ALT: 15 U/L (ref 0–35)
AST: 18 U/L (ref 0–37)
Albumin: 4.1 g/dL (ref 3.5–5.2)
Alkaline Phosphatase: 45 U/L (ref 39–117)
BUN: 10 mg/dL (ref 6–23)
CO2: 30 mEq/L (ref 19–32)
Calcium: 10 mg/dL (ref 8.4–10.5)
Chloride: 104 mEq/L (ref 96–112)
Creatinine, Ser: 0.68 mg/dL (ref 0.40–1.20)
GFR: 86.94 mL/min (ref >60.00)
Glucose, Bld: 91 mg/dL (ref 70–99)
Potassium: 4.1 mEq/L (ref 3.5–5.1)
Sodium: 142 mEq/L (ref 135–145)
Total Bilirubin: 1.8 mg/dL — ABNORMAL HIGH (ref 0.2–1.2)
Total Protein: 6.4 g/dL (ref 6.0–8.3)

## 2020-09-11 LAB — CBC WITH DIFFERENTIAL/PLATELET
Basophils Absolute: 0.1 10*3/uL (ref 0.0–0.1)
Basophils Relative: 1.1 % (ref 0.0–3.0)
Eosinophils Absolute: 0.4 10*3/uL (ref 0.0–0.7)
Eosinophils Relative: 5.6 % — ABNORMAL HIGH (ref 0.0–5.0)
HCT: 39.8 % (ref 36.0–46.0)
Hemoglobin: 13.6 g/dL (ref 12.0–15.0)
Lymphocytes Relative: 28.2 % (ref 12.0–46.0)
Lymphs Abs: 1.9 10*3/uL (ref 0.7–4.0)
MCHC: 34 g/dL (ref 30.0–36.0)
MCV: 89.8 fl (ref 78.0–100.0)
Monocytes Absolute: 0.6 10*3/uL (ref 0.1–1.0)
Monocytes Relative: 8.4 % (ref 3.0–12.0)
Neutro Abs: 3.8 10*3/uL (ref 1.4–7.7)
Neutrophils Relative %: 56.7 % (ref 43.0–77.0)
Platelets: 261 10*3/uL (ref 150.0–400.0)
RBC: 4.44 Mil/uL (ref 3.87–5.11)
RDW: 12.5 % (ref 11.5–15.5)
WBC: 6.6 10*3/uL (ref 4.0–10.5)

## 2020-09-11 LAB — LIPID PANEL
Cholesterol: 161 mg/dL (ref 0–200)
HDL: 67.2 mg/dL (ref >39.00)
LDL Cholesterol: 68 mg/dL (ref 0–99)
NonHDL: 93.35
Total CHOL/HDL Ratio: 2
Triglycerides: 127 mg/dL (ref 0.0–149.0)
VLDL: 25.4 mg/dL (ref 0.0–40.0)

## 2020-09-11 LAB — TSH: TSH: 1.12 u[IU]/mL (ref 0.35–4.50)

## 2020-09-12 LAB — URINALYSIS, ROUTINE W REFLEX MICROSCOPIC
Bilirubin, UA: NEGATIVE
Glucose, UA: NEGATIVE
Ketones, UA: NEGATIVE
Leukocytes,UA: NEGATIVE
Nitrite, UA: NEGATIVE
Protein,UA: NEGATIVE
RBC, UA: NEGATIVE
Specific Gravity, UA: 1.015 (ref 1.005–1.030)
Urobilinogen, Ur: 0.2 mg/dL (ref 0.2–1.0)
pH, UA: 8 — ABNORMAL HIGH (ref 5.0–7.5)

## 2020-09-17 ENCOUNTER — Encounter: Payer: Self-pay | Admitting: Dermatology

## 2020-11-06 ENCOUNTER — Telehealth: Payer: Self-pay | Admitting: Internal Medicine

## 2020-11-06 NOTE — Telephone Encounter (Signed)
Pt called and wanted to know if she could get motion sickness patches called in, she is going on a cruise Saturday

## 2020-11-06 NOTE — Telephone Encounter (Signed)
Patient last seen 05/30/20.   Okay for patches?

## 2020-11-08 ENCOUNTER — Other Ambulatory Visit: Payer: Self-pay | Admitting: Internal Medicine

## 2020-11-08 DIAGNOSIS — T753XXA Motion sickness, initial encounter: Secondary | ICD-10-CM

## 2020-11-08 MED ORDER — SCOPOLAMINE 1 MG/3DAYS TD PT72: 1.0000 | MEDICATED_PATCH | TRANSDERMAL | 0 refills | Status: DC

## 2020-11-08 NOTE — Telephone Encounter (Signed)
Sent in patch

## 2020-11-09 NOTE — Telephone Encounter (Signed)
Patient calling back in and informed by front desk

## 2020-11-09 NOTE — Telephone Encounter (Signed)
Left message to return call.  Okay to inform Patient that medication has been sent in if calling back in

## 2020-11-22 ENCOUNTER — Encounter: Payer: Self-pay | Admitting: Family

## 2020-11-22 ENCOUNTER — Encounter: Payer: Self-pay | Admitting: Internal Medicine

## 2020-11-22 ENCOUNTER — Telehealth (INDEPENDENT_AMBULATORY_CARE_PROVIDER_SITE_OTHER): Payer: PPO | Admitting: Family

## 2020-11-22 DIAGNOSIS — U071 COVID-19: Secondary | ICD-10-CM | POA: Diagnosis not present

## 2020-11-22 MED ORDER — NIRMATRELVIR/RITONAVIR (PAXLOVID)TABLET: 3.0000 | ORAL_TABLET | Freq: Two times a day (BID) | ORAL | 0 refills | Status: AC

## 2020-11-22 MED ORDER — BENZONATATE 100 MG PO CAPS: 100.0000 mg | ORAL_CAPSULE | Freq: Three times a day (TID) | ORAL | 1 refills | Status: DC | PRN

## 2020-11-22 NOTE — Progress Notes (Signed)
Virtual Visit via Video Note  I connected with@  on 11/22/20 at  4:00 PM EDT by a video enabled telemedicine application and verified that I am speaking with the correct person using two identifiers.  Location patient: home Location provider:work  Persons participating in the virtual visit: patient, provider  I discussed the limitations of evaluation and management by telemedicine and the availability of in person appointments. The patient expressed understanding and agreed to proceed.   HPI: Acute visit Chief complaint COVID-19 positive today with home test Symptoms started yesterday.   She endorses nasal congestion, cough, fever (T-max 100.7) , HA Drinking plenty water.   No sob, cp.   She has been taking Tylenol with temporary relief.   H/o HLD  Covid vaccines 4/4 Non smoker GFR 86, two months ago   ROS: See pertinent positives and negatives per HPI.    EXAM:  VITALS per patient if applicable: There were no vitals taken for this visit. BP Readings from Last 3 Encounters:  05/30/20 120/80  11/25/19 124/70  08/19/19 135/72   Wt Readings from Last 3 Encounters:  06/26/20 132 lb (59.9 kg)  05/30/20 132 lb 6.4 oz (60.1 kg)  05/04/20 133 lb (60.3 kg)    GENERAL: alert, oriented, appears well and in no acute distress  HEENT: atraumatic, conjunttiva clear, no obvious abnormalities on inspection of external nose and ears  NECK: normal movements of the head and neck  LUNGS: on inspection no signs of respiratory distress, breathing rate appears normal, no obvious gross SOB, gasping or wheezing  CV: no obvious cyanosis  MS: moves all visible extremities without noticeable abnormality  PSYCH/NEURO: pleasant and cooperative, no obvious depression or anxiety, speech and thought processing grossly intact  ASSESSMENT AND PLAN:  Discussed the following assessment and plan:  Problem List Items Addressed This Visit       Other   COVID-19 - Primary    Patient  well-appearing.  no acute respiratory distress.  Counseled on lacking long term safely and effectiveness data of medication, Paxlovid. Explained EUA for Paxlovid. Criteria met for consideration of Paxlovid,  patient older than 12 years and weight > 40kg, started within 5 days of symptom onset and risk factor for severe disease include:  Age > 65, HLD.   GFR > 60.  Counseled on adverse effects including altered taste, diarrhea, HTN, and myalgia.    Patient is most comfortable and desires to start Paxlovid and understands to call me with concerns or new symptoms.         Relevant Medications   nirmatrelvir/ritonavir EUA (PAXLOVID) TABS   benzonatate (TESSALON) 100 MG capsule    -we discussed possible serious and likely etiologies, options for evaluation and workup, limitations of telemedicine visit vs in person visit, treatment, treatment risks and precautions. Pt prefers to treat via telemedicine empirically rather then risking or undertaking an in person visit at this moment.  .   I discussed the assessment and treatment plan with the patient. The patient was provided an opportunity to ask questions and all were answered. The patient agreed with the plan and demonstrated an understanding of the instructions.   The patient was advised to call back or seek an in-person evaluation if the symptoms worsen or if the condition fails to improve as anticipated.   Mable Paris, FNP

## 2020-11-22 NOTE — Assessment & Plan Note (Signed)
Patient well-appearing.  no acute respiratory distress.  Counseled on lacking long term safely and effectiveness data of medication, Paxlovid. Explained EUA for Paxlovid. Criteria met for consideration of Paxlovid,  patient older than 12 years and weight > 40kg, started within 5 days of symptom onset and risk factor for severe disease include:  Age > 65, HLD.   GFR > 60.  Counseled on adverse effects including altered taste, diarrhea, HTN, and myalgia.    Patient is most comfortable and desires to start Paxlovid and understands to call me with concerns or new symptoms.

## 2020-11-27 ENCOUNTER — Telehealth: Payer: Self-pay | Admitting: Internal Medicine

## 2020-11-27 NOTE — Telephone Encounter (Signed)
Noted  

## 2020-11-27 NOTE — Telephone Encounter (Signed)
Pt called she wanted to let Joycelyn Schmid know that she discontinued the  nirmatrelvir/ritonavir EUA (PAXLOVID) TABS she was having sever diarrhea.

## 2020-11-27 NOTE — Telephone Encounter (Signed)
FYI patient d/c Paxlovid yesterday & stated that the diarrhea had been worse than Covid itself. She was feeling better & was not SOB. She was feeling much better today & she is out of quarantine today. She asked if okay to go out in public with mask. I advised according to CDC, yes. She confirmed that she hadn't had a fever in 48 hours without antipyretic use.

## 2020-11-29 ENCOUNTER — Telehealth (INDEPENDENT_AMBULATORY_CARE_PROVIDER_SITE_OTHER): Payer: PPO | Admitting: Internal Medicine

## 2020-11-29 ENCOUNTER — Other Ambulatory Visit: Payer: Self-pay

## 2020-11-29 ENCOUNTER — Encounter: Payer: Self-pay | Admitting: Internal Medicine

## 2020-11-29 VITALS — Ht 60.75 in | Wt 122.7 lb

## 2020-11-29 DIAGNOSIS — R197 Diarrhea, unspecified: Secondary | ICD-10-CM | POA: Diagnosis not present

## 2020-11-29 DIAGNOSIS — E860 Dehydration: Secondary | ICD-10-CM | POA: Diagnosis not present

## 2020-11-29 DIAGNOSIS — U071 COVID-19: Secondary | ICD-10-CM

## 2020-11-29 NOTE — Progress Notes (Signed)
Virtual Visit via Video Note  I connected with Andrea Spencer  on 11/29/20 at  9:00 AM EDT by a video enabled telemedicine application and verified that I am speaking with the correct person using two identifiers.  Location patient: home, [place] Location provider:work or home office Persons participating in the virtual visit: patient, provider  I discussed the limitations of evaluation and management by telemedicine and the availability of in person appointments. The patient expressed understanding and agreed to proceed.   HPI:  Acute telemedicine visit for : Went on riverboat cruise [place]. Roommate had covid and + 11/22/20 with fever and diarrhea ? Max # episodes but improving she was also week tried paxlovid 4/5 days and caused worsening diarrhea. Also had dry cough. She is back to eating and diarrhea episodes less lost 7 lbs in 1 day she was dizzy but resolved  -COVID-19 vaccine status:4/4  ROS: See pertinent positives and negatives per HPI.  Past Medical History:  Diagnosis Date   Arthritis    Basal cell carcinoma 12/10/2017   Left alar crease. Micronodular pattern   Basal cell carcinoma 12/10/2017   Left upper back. Nodular.    Basal cell carcinoma 12/10/2017   Right medial mid back. Superficial.   Basal cell carcinoma 12/10/2017   Right lateral mid back. Nodular   Basal cell carcinoma 12/24/2018   Right mid back. Nodular   Basal cell carcinoma 12/24/2018   Left anterior shoulder. Nodular   Breast mass    right   Cancer (HCC)    skin cancer SCC, BCC multiple    COVID-19    11/22/20   Headache    History of bronchitis    History of pneumonia    Hyperlipidemia     Past Surgical History:  Procedure Laterality Date   BREAST LUMPECTOMY     right 2016 negative malignancy fibrocystic changes    COLONOSCOPY     DILATION AND CURETTAGE OF UTERUS     JOINT REPLACEMENT     left    MOHS SURGERY     x2 face    MOHS SURGERY     L alar 02/04/18    RADIOACTIVE  SEED GUIDED EXCISIONAL BREAST BIOPSY Right 04/06/2015   Procedure: RADIOACTIVE SEED GUIDED EXCISIONAL RIGHT BREAST BIOPSY;  Surgeon: Stark Klein, MD;  Location: [facility];  Service: General;  Laterality: Right;   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   Procedure: TOTAL HIP ARTHROPLASTY ANTERIOR APPROACH;  Surgeon: Renette Butters, MD;  Location: [facility];  Service: Orthopedics;  Laterality: Left;   WISDOM TOOTH EXTRACTION       Current Outpatient Medications:    Apoaequorin (PREVAGEN EXTRA STRENGTH PO), Take by mouth., Disp: , Rfl:    aspirin 81 MG tablet, Take 81 mg by mouth daily., Disp: , Rfl:    atorvastatin (LIPITOR) 10 MG tablet, Take 1 tablet (10 mg total) by mouth daily at 6 PM., Disp: 90 tablet, Rfl: 3   Biotin 10 MG CAPS, Take 1,000 mg by mouth daily. , Disp: , Rfl:    Calcium Carb-Cholecalciferol ([employer] CALCIUM 600 & VITAMIN D3 PO), Take by mouth 2 (two) times daily after a meal., Disp: , Rfl:    cholecalciferol (VITAMIN D) 1000 UNITS tablet, Take 1,000 Units by mouth daily., Disp: , Rfl:    ketoconazole (NIZORAL) 2 % shampoo, apply three times per week, massage into scalp and leave in for 10 minutes before rinsing out, Disp: 120 mL, Rfl: [place]  Oil 1000 MG CAPS, Take 3 capsules by mouth daily. , Disp: , Rfl:    Multiple Vitamin (MULTIVITAMIN WITH MINERALS) TABS tablet, Take 1 tablet by mouth daily., Disp: , Rfl:    triamcinolone ointment (KENALOG) 0.1 %, Apply twice daily to affected areas up to 2 weeks as needed for itch. Avoid applying to face, groin, and axilla. Use as directed. Risk of skin atrophy with long-term use reviewed., Disp: 60 g, Rfl: 2   TURMERIC PO, Take by mouth daily., Disp: , Rfl:    vitamin C (ASCORBIC ACID) 250 MG tablet, Take 500 mg by mouth daily. Taking two tablets daily, Disp: , Rfl:    Acetaminophen (TYLENOL ARTHRITIS PAIN PO), Take by mouth. (Patient not taking: Reported on 11/29/2020), Disp: , Rfl:    benzonatate (TESSALON) 100 MG capsule, Take 1 capsule (100  mg total) by mouth 3 (three) times daily as needed for cough. (Patient not taking: Reported on 11/29/2020), Disp: 20 capsule, Rfl: 1   scopolamine (TRANSDERM-SCOP) 1 MG/3DAYS, Place 1 patch (1.5 mg total) onto the skin every 3 (three) days. Motion sickness (Patient not taking: No sig reported), Disp: 3 patch, Rfl: 0  EXAM:  VITALS per patient if applicable:  GENERAL: alert, oriented, appears well and in no acute distress  HEENT: atraumatic, conjunttiva clear, no obvious abnormalities on inspection of external nose and ears  NECK: normal movements of the head and neck  LUNGS: on inspection no signs of respiratory distress, breathing rate appears normal, no obvious gross SOB, gasping or wheezing  CV: no obvious cyanosis  MS: moves all visible extremities without noticeable abnormality  PSYCH/NEURO: pleasant and cooperative, no obvious depression or anxiety, speech and thought processing grossly intact  ASSESSMENT AND PLAN:  Discussed the following assessment and plan:  Diarrhea, due to covid Dehydration COVID-19 Supportive care feeling better brat, peptobismol and prn immodium  -we discussed possible serious and likely etiologies, options for evaluation and workup, limitations of telemedicine visit vs in person visit, treatment, treatment risks and precautions. Pt prefers to treat via telemedicine empirically rather than in person at this moment.    I discussed the assessment and treatment plan with the patient. The patient was provided an opportunity to ask questions and all were answered. The patient agreed with the plan and demonstrated an understanding of the instructions.    Time spent 20 min Delorise Jackson, MD

## 2020-11-29 NOTE — Progress Notes (Signed)
Patient presenting with Diarrhea ongoing since 7/27, tested positive for Covid at the same time.  Diarrhea is a muddy color.

## 2021-01-10 DIAGNOSIS — M25511 Pain in right shoulder: Secondary | ICD-10-CM | POA: Diagnosis not present

## 2021-01-10 DIAGNOSIS — M545 Low back pain, unspecified: Secondary | ICD-10-CM | POA: Diagnosis not present

## 2021-01-10 DIAGNOSIS — M25552 Pain in left hip: Secondary | ICD-10-CM | POA: Diagnosis not present

## 2021-01-11 DIAGNOSIS — M25511 Pain in right shoulder: Secondary | ICD-10-CM | POA: Diagnosis not present

## 2021-01-11 DIAGNOSIS — M5442 Lumbago with sciatica, left side: Secondary | ICD-10-CM | POA: Diagnosis not present

## 2021-01-11 DIAGNOSIS — M6281 Muscle weakness (generalized): Secondary | ICD-10-CM | POA: Diagnosis not present

## 2021-01-17 DIAGNOSIS — M6281 Muscle weakness (generalized): Secondary | ICD-10-CM | POA: Diagnosis not present

## 2021-01-17 DIAGNOSIS — M25511 Pain in right shoulder: Secondary | ICD-10-CM | POA: Diagnosis not present

## 2021-01-17 DIAGNOSIS — M5442 Lumbago with sciatica, left side: Secondary | ICD-10-CM | POA: Diagnosis not present

## 2021-01-19 DIAGNOSIS — M25511 Pain in right shoulder: Secondary | ICD-10-CM | POA: Diagnosis not present

## 2021-01-19 DIAGNOSIS — M6281 Muscle weakness (generalized): Secondary | ICD-10-CM | POA: Diagnosis not present

## 2021-01-19 DIAGNOSIS — M5442 Lumbago with sciatica, left side: Secondary | ICD-10-CM | POA: Diagnosis not present

## 2021-01-23 ENCOUNTER — Encounter: Payer: Self-pay | Admitting: Family

## 2021-01-24 DIAGNOSIS — M5442 Lumbago with sciatica, left side: Secondary | ICD-10-CM | POA: Diagnosis not present

## 2021-01-24 DIAGNOSIS — M25511 Pain in right shoulder: Secondary | ICD-10-CM | POA: Diagnosis not present

## 2021-01-24 DIAGNOSIS — M6281 Muscle weakness (generalized): Secondary | ICD-10-CM | POA: Diagnosis not present

## 2021-01-26 DIAGNOSIS — M6281 Muscle weakness (generalized): Secondary | ICD-10-CM | POA: Diagnosis not present

## 2021-01-26 DIAGNOSIS — M5442 Lumbago with sciatica, left side: Secondary | ICD-10-CM | POA: Diagnosis not present

## 2021-01-26 DIAGNOSIS — M25511 Pain in right shoulder: Secondary | ICD-10-CM | POA: Diagnosis not present

## 2021-02-14 ENCOUNTER — Encounter: Payer: Self-pay | Admitting: Internal Medicine

## 2021-02-14 DIAGNOSIS — M545 Low back pain, unspecified: Secondary | ICD-10-CM | POA: Insufficient documentation

## 2021-02-14 DIAGNOSIS — M7541 Impingement syndrome of right shoulder: Secondary | ICD-10-CM | POA: Insufficient documentation

## 2021-02-21 DIAGNOSIS — M25511 Pain in right shoulder: Secondary | ICD-10-CM | POA: Diagnosis not present

## 2021-02-26 ENCOUNTER — Telehealth: Payer: Self-pay

## 2021-02-26 NOTE — Telephone Encounter (Signed)
Patient left a nurse voicemail that she has been having a significant amount of hair loss since starting the Ketoconazole. Have you heard of this happening possibly?

## 2021-02-27 NOTE — Telephone Encounter (Signed)
This is unlikely to be related to the ketoconazole unless she notes her hair is very dry and she is getting some breakage. Significant hair loss can be caused by recent illness, infection, weight loss, surgery as well as from underlying medical issues like vitamin deficiency, thyroid disease, or iron deficiency. She is welcome to switch to Selsun blue (selenium sulfide) or Head and Shoulders (pyrithione zinc) for her dandruff to see if this makes any difference, but I would otherwise recommend getting evaluated for the hair loss in case it is related to one of the other issues. Thank you!

## 2021-02-27 NOTE — Telephone Encounter (Signed)
Patient advised of information per Dr. Laurence Ferrari. She recently had COVID along with the viral medication that made her very sick with weight loss at the time. Patient is scheduled to come see you in two weeks for a 6 month TBSE. aw

## 2021-03-14 ENCOUNTER — Encounter: Payer: PPO | Admitting: Dermatology

## 2021-04-03 ENCOUNTER — Ambulatory Visit (INDEPENDENT_AMBULATORY_CARE_PROVIDER_SITE_OTHER): Payer: PPO | Admitting: Internal Medicine

## 2021-04-03 ENCOUNTER — Encounter: Payer: Self-pay | Admitting: Internal Medicine

## 2021-04-03 ENCOUNTER — Other Ambulatory Visit: Payer: Self-pay

## 2021-04-03 VITALS — BP 110/70 | HR 70 | Temp 98.9°F | Ht 61.5 in | Wt 122.2 lb

## 2021-04-03 DIAGNOSIS — E785 Hyperlipidemia, unspecified: Secondary | ICD-10-CM

## 2021-04-03 DIAGNOSIS — S60452A Superficial foreign body of right middle finger, initial encounter: Secondary | ICD-10-CM | POA: Diagnosis not present

## 2021-04-03 DIAGNOSIS — Z1231 Encounter for screening mammogram for malignant neoplasm of breast: Secondary | ICD-10-CM

## 2021-04-03 DIAGNOSIS — Z1211 Encounter for screening for malignant neoplasm of colon: Secondary | ICD-10-CM

## 2021-04-03 MED ORDER — ATORVASTATIN CALCIUM 10 MG PO TABS: 10.0000 mg | ORAL_TABLET | Freq: Every day | ORAL | 3 refills | Status: DC

## 2021-04-03 NOTE — Progress Notes (Signed)
Chief Complaint  Patient presents with   Follow-up    Slinter in finger   F/u  1. Right hand middle finger splinter x 2 weeks a friends daughter who used to be a doctor tried to get it out by squeezing but finger was swollen and painful and now has hard area and feels like retained wood in finger from going up banister at her home  2. Hld on lipitor 10 mg qhs    Review of Systems  Constitutional:  Negative for weight loss.  HENT:  Negative for hearing loss.   Eyes:  Negative for blurred vision.  Respiratory:  Negative for shortness of breath.   Cardiovascular:  Negative for chest pain.  Gastrointestinal:  Negative for abdominal pain and blood in stool.  Genitourinary:  Negative for dysuria.  Musculoskeletal:  Negative for falls and joint pain.  Skin:  Negative for rash.  Neurological:  Negative for headaches.  Psychiatric/Behavioral:  Negative for depression.   Past Medical History:  Diagnosis Date   Arthritis    Basal cell carcinoma 12/10/2017   Left alar crease. Micronodular pattern   Basal cell carcinoma 12/10/2017   Left upper back. Nodular.    Basal cell carcinoma 12/10/2017   Right medial mid back. Superficial.   Basal cell carcinoma 12/10/2017   Right lateral mid back. Nodular   Basal cell carcinoma 12/24/2018   Right mid back. Nodular   Basal cell carcinoma 12/24/2018   Left anterior shoulder. Nodular   Breast mass    right   Cancer (HCC)    skin cancer SCC, BCC multiple    COVID-19    11/22/20   Headache    History of bronchitis    History of pneumonia    Hyperlipidemia    Past Surgical History:  Procedure Laterality Date   BREAST LUMPECTOMY     right 2016 negative malignancy fibrocystic changes    COLONOSCOPY     DILATION AND CURETTAGE OF UTERUS     JOINT REPLACEMENT     left    MOHS SURGERY     x2 face    MOHS SURGERY     L alar 02/04/18    RADIOACTIVE SEED GUIDED EXCISIONAL BREAST BIOPSY Right 04/06/2015   Procedure: RADIOACTIVE SEED GUIDED  EXCISIONAL RIGHT BREAST BIOPSY;  Surgeon: Stark Klein, MD;  Location: [facility];  Service: General;  Laterality: Right;   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   Procedure: TOTAL HIP ARTHROPLASTY ANTERIOR APPROACH;  Surgeon: Renette Butters, MD;  Location: [facility];  Service: Orthopedics;  Laterality: Left;   WISDOM TOOTH EXTRACTION     Family History  Problem Relation Age of Onset   Stroke Mother    Alzheimer's disease Father    Social History   Socioeconomic History   Marital status: Widowed    Spouse name: Not on file   Number of children: Not on file   Years of education: Not on file   Highest education level: Not on file  Occupational History   Not on file  Tobacco Use   Smoking status: Never   Smokeless tobacco: Never  Substance and Sexual Activity   Alcohol use: Yes    Comment: social   Drug use: No   Sexual activity: Not Currently  Other Topics Concern   Not on file  Social History Narrative   Widowed husband died 2018-11-18    1 son lives in [place]; also has step kids (son and daughter though deceased husband [place])   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    Administrative asst    Owns guns, wear seat belt    Safe in relationship    1 dog Viszla named [employer]   Social Determinants of Radio broadcast assistant Strain: Low Risk    Difficulty of Paying Living Expenses: Not hard at all  Food Insecurity: No Food Insecurity   Worried About Charity fundraiser in the Last Year: Never true   Arboriculturist in the Last Year: Never true  Transportation Needs: No Transportation Needs   Lack of Transportation (Medical): No   Lack of Transportation (Non-Medical): No  Physical Activity: Insufficiently Active   Days of Exercise per Week: 4 days   Minutes of Exercise per Session: 30 min  Stress: No Stress Concern Present   Feeling of Stress : Not at all  Social Connections: Unknown   Frequency of Communication with Friends and Family: More than three times a week   Frequency of Social Gatherings  with Friends and Family: More than three times a week   Attends Religious Services: Not on Electrical engineer or Organizations: Not on file   Attends Archivist Meetings: Not on file   Marital Status: Not on file  Intimate Partner Violence: Not At Risk   Fear of Current or Ex-Partner: No   Emotionally Abused: No   Physically Abused: No   Sexually Abused: No   Current Meds  Medication Sig   Acetaminophen (TYLENOL ARTHRITIS PAIN PO) Take by mouth.   Apoaequorin (PREVAGEN EXTRA STRENGTH PO) Take by mouth.   aspirin 81 MG tablet Take 81 mg by mouth daily.   atorvastatin (LIPITOR) 10 MG tablet Take 1 tablet (10 mg total) by mouth daily at 6 PM.   Biotin 10 MG CAPS Take 1,000 mg by mouth daily.    Calcium Carb-Cholecalciferol ([employer] CALCIUM 600 & VITAMIN D3 PO) Take by mouth 2 (two) times daily after a meal.   cholecalciferol (VITAMIN D) 1000 UNITS tablet Take 1,000 Units by mouth daily.   ketoconazole (NIZORAL) 2 % shampoo apply three times per week, massage into scalp and leave in for 10 minutes before rinsing out   Krill Oil 1000 MG CAPS Take 3 capsules by mouth daily.    Multiple Vitamin (MULTIVITAMIN WITH MINERALS) TABS tablet Take 1 tablet by mouth daily.   scopolamine (TRANSDERM-SCOP) 1 MG/3DAYS Place 1 patch (1.5 mg total) onto the skin every 3 (three) days. Motion sickness   triamcinolone ointment (KENALOG) 0.1 % Apply twice daily to affected areas up to 2 weeks as needed for itch. Avoid applying to face, groin, and axilla. Use as directed. Risk of skin atrophy with long-term use reviewed.   TURMERIC PO Take by mouth daily.   vitamin C (ASCORBIC ACID) 250 MG tablet Take 500 mg by mouth daily. Taking two tablets daily   Allergies  Allergen Reactions   Aspartame And Phenylalanine    Meloxicam Hives and Diarrhea   Other Itching    Sunscreens Lotions and Tanning products   No results found for this or any previous visit (from the past 2160 hour(s)). Objective   Body mass index is 22.72 kg/m. Wt Readings from Last 3 Encounters:  04/03/21 122 lb 3.2 oz (55.4 kg)  11/29/20 122 lb 11.2 oz (55.7 kg)  06/26/20 132 lb (59.9 kg)   Temp Readings from Last 3 Encounters:  04/03/21 98.9 F (37.2 C) (Oral)  05/30/20 98.1 F (36.7 C) (Oral)  11/25/19 98.5  F (36.9 C) (Oral)   BP Readings from Last 3 Encounters:  04/03/21 110/70  05/30/20 120/80  11/25/19 124/70   Pulse Readings from Last 3 Encounters:  04/03/21 70  05/30/20 79  11/25/19 72    Physical Exam Vitals and nursing note reviewed.  Constitutional:      Appearance: Normal appearance. She is well-developed and well-groomed.  HENT:     Head: Normocephalic and atraumatic.  Eyes:     Conjunctiva/sclera: Conjunctivae normal.     Pupils: Pupils are equal, round, and reactive to light.  Cardiovascular:     Rate and Rhythm: Normal rate and regular rhythm.     Heart sounds: Normal heart sounds. No murmur heard. Pulmonary:     Effort: Pulmonary effort is normal.     Breath sounds: Normal breath sounds.  Abdominal:     General: Abdomen is flat. Bowel sounds are normal.     Tenderness: There is no abdominal tenderness.  Musculoskeletal:        General: No tenderness.  Skin:    General: Skin is warm and dry.  Neurological:     General: No focal deficit present.     Mental Status: She is alert and oriented to person, place, and time. Mental status is at baseline.     Cranial Nerves: Cranial nerves 2-12 are intact.     Gait: Gait is intact.  Psychiatric:        Attention and Perception: Attention and perception normal.        Mood and Affect: Mood and affect normal.        Speech: Speech normal.        Behavior: Behavior normal. Behavior is cooperative.        Thought Content: Thought content normal.        Cognition and Memory: Cognition and memory normal.        Judgment: Judgment normal.    Assessment  Plan  Foreign body in skin of right middle finger - Plan: Ambulatory  referral to Orthopedic Surgery  Hyperlipidemia, unspecified hyperlipidemia type  On lipitor 10 mg qhs   HM Had flu shot utd shingrix 2/2  utd Tdap  utd prevnar and pna 25  covid 5/5   Out of age window pap  07/31/20 neg mammo ordered 2023 DEXA 05/28/2018 osteopenia repeat in 2023  cologuard 06/08/20 negative ordered gi referral [facility] [place]   H/o skin SCC, BCC multiple f/u Dr. Laurence Ferrari last seen fall 2020  mohs 3rd to nose 01/2018 Dr. Elvera [facility]    [facility] left alar nose mohs on 02/04/18 Dr. Manley Mason, derm f/u 05/2018  appt derm 01/2020 f/u Q6 months    Cont work out [facility] 5x per week    Provider: Dr. Olivia Mackie McLean-Scocuzza-Internal Medicine

## 2021-04-03 NOTE — Patient Instructions (Addendum)
  Mammogram due 07/31/21    [phone] Not available [place] [id number]      Specialties     Orthopedic Surgery      Dr. Fredda Hammed colonoscopy  [phone] Not available [place] [id number]      Specialties     Gastroenterology

## 2021-04-03 NOTE — Addendum Note (Signed)
Addended by: Orland Mustard on: 04/03/2021 12:52 PM   Modules accepted: Orders

## 2021-04-06 ENCOUNTER — Telehealth: Payer: Self-pay | Admitting: Internal Medicine

## 2021-04-06 NOTE — Telephone Encounter (Signed)
Rejection Reason - Patient Declined - Patient is feeling better." Andrea Spencer said on Apr 06, 2021 10:27 AM  Msg from [facility]

## 2021-05-04 ENCOUNTER — Telehealth: Payer: Self-pay | Admitting: Internal Medicine

## 2021-05-04 NOTE — Telephone Encounter (Signed)
Patient is going on a cruise next month and would like the patch form for motion sickness.

## 2021-05-07 ENCOUNTER — Other Ambulatory Visit: Payer: Self-pay | Admitting: Internal Medicine

## 2021-05-07 DIAGNOSIS — T753XXD Motion sickness, subsequent encounter: Secondary | ICD-10-CM

## 2021-05-07 DIAGNOSIS — T753XXA Motion sickness, initial encounter: Secondary | ICD-10-CM

## 2021-05-07 MED ORDER — SCOPOLAMINE 1 MG/3DAYS TD PT72: 1.0000 | MEDICATED_PATCH | TRANSDERMAL | 0 refills | Status: DC

## 2021-05-07 NOTE — Telephone Encounter (Signed)
Please advise 

## 2021-05-07 NOTE — Telephone Encounter (Signed)
Resent again had sent in 10/2020 so this is ok

## 2021-05-08 NOTE — Telephone Encounter (Signed)
Patient informed and verbalized understanding

## 2021-05-08 NOTE — Telephone Encounter (Signed)
Patient state she has already picked up the medication

## 2021-05-17 ENCOUNTER — Encounter: Payer: PPO | Admitting: Dermatology

## 2021-05-17 ENCOUNTER — Other Ambulatory Visit: Payer: Self-pay

## 2021-05-23 ENCOUNTER — Ambulatory Visit: Payer: PPO | Admitting: Dermatology

## 2021-05-23 ENCOUNTER — Encounter: Payer: Self-pay | Admitting: Dermatology

## 2021-05-23 ENCOUNTER — Other Ambulatory Visit: Payer: Self-pay

## 2021-05-23 DIAGNOSIS — Z872 Personal history of diseases of the skin and subcutaneous tissue: Secondary | ICD-10-CM

## 2021-05-23 DIAGNOSIS — L570 Actinic keratosis: Secondary | ICD-10-CM | POA: Diagnosis not present

## 2021-05-23 DIAGNOSIS — Z85828 Personal history of other malignant neoplasm of skin: Secondary | ICD-10-CM | POA: Diagnosis not present

## 2021-05-23 DIAGNOSIS — C4491 Basal cell carcinoma of skin, unspecified: Secondary | ICD-10-CM

## 2021-05-23 DIAGNOSIS — L821 Other seborrheic keratosis: Secondary | ICD-10-CM

## 2021-05-23 DIAGNOSIS — D1800 Hemangioma unspecified site: Secondary | ICD-10-CM | POA: Diagnosis not present

## 2021-05-23 DIAGNOSIS — C44319 Basal cell carcinoma of skin of other parts of face: Secondary | ICD-10-CM

## 2021-05-23 DIAGNOSIS — L814 Other melanin hyperpigmentation: Secondary | ICD-10-CM | POA: Diagnosis not present

## 2021-05-23 DIAGNOSIS — C441192 Basal cell carcinoma of skin of left lower eyelid, including canthus: Secondary | ICD-10-CM | POA: Diagnosis not present

## 2021-05-23 DIAGNOSIS — L578 Other skin changes due to chronic exposure to nonionizing radiation: Secondary | ICD-10-CM | POA: Diagnosis not present

## 2021-05-23 DIAGNOSIS — D485 Neoplasm of uncertain behavior of skin: Secondary | ICD-10-CM

## 2021-05-23 DIAGNOSIS — L820 Inflamed seborrheic keratosis: Secondary | ICD-10-CM

## 2021-05-23 DIAGNOSIS — Z1283 Encounter for screening for malignant neoplasm of skin: Secondary | ICD-10-CM

## 2021-05-23 DIAGNOSIS — D229 Melanocytic nevi, unspecified: Secondary | ICD-10-CM

## 2021-05-23 HISTORY — DX: Basal cell carcinoma of skin, unspecified: C44.91

## 2021-05-23 MED ORDER — FLUOROURACIL 5 % EX CREA: TOPICAL_CREAM | Freq: Two times a day (BID) | CUTANEOUS | 0 refills | Status: DC

## 2021-05-23 NOTE — Patient Instructions (Addendum)
Recommend Niacinamide or Nicotinamide 500mg  twice per day to lower risk of non-melanoma skin cancer by approximately 25%. This is usually available at [employer].  Recommend taking Heliocare sun protection supplement daily in sunny weather for additional sun protection. For maximum protection on the sunniest days, you can take up to 2 capsules of regular Heliocare OR take 1 capsule of Heliocare Ultra. For prolonged exposure (such as a full day in the sun), you can repeat your dose of the supplement 4 hours after your first dose. Heliocare can be purchased at [employer], at some [facility] or at [email or web address].    Cryotherapy Aftercare  Wash gently with soap and water everyday.   Apply Vaseline and Band-Aid daily until healed.   Prior to procedure, discussed risks of blister formation, small wound, skin dyspigmentation, or rare scar following cryotherapy. Recommend Vaseline ointment to treated areas while healing.  Start 5-fluorouracil/calcipotriene cream twice a day for 4 days to affected areas including right cheek. Prescription sent to [facility]. Patient provided with contact information for pharmacy and advised the pharmacy will mail the prescription to their home. Patient provided with handout reviewing treatment course and side effects and advised to call or message [place] on MyChart with any concerns. May repeat in 1 month.    Wound Care Instructions  Cleanse wound gently with soap and water once a day then pat dry with clean gauze. Apply a thing coat of Petrolatum (petroleum jelly, "Vaseline") over the wound (unless you have an allergy to this). We recommend that you use a new, sterile tube of Vaseline. Do not pick or remove scabs. Do not remove the yellow or white "healing tissue" from the base of the wound.  Cover the wound with fresh, clean, nonstick gauze and secure with paper tape. You may use Band-Aids in place of gauze and tape if the would is small enough, but  would recommend trimming much of the tape off as there is often too much. Sometimes Band-Aids can irritate the skin.  You should call the office for your biopsy report after 1 week if you have not already been contacted.  If you experience any problems, such as abnormal amounts of bleeding, swelling, significant bruising, significant pain, or evidence of infection, please call the office immediately.   Recommend taking Heliocare sun protection supplement daily in sunny weather for additional sun protection. For maximum protection on the sunniest days, you can take up to 2 capsules of regular Heliocare OR take 1 capsule of Heliocare Ultra. For prolonged exposure (such as a full day in the sun), you can repeat your dose of the supplement 4 hours after your first dose. Heliocare can be purchased at [employer], at some [facility] or at [email or web address].    Melanoma ABCDEs  Melanoma is the most dangerous type of skin cancer, and is the leading cause of death from skin disease.  You are more likely to develop melanoma if you: Have light-colored skin, light-colored eyes, or red or blond hair Spend a lot of time in the sun Tan regularly, either outdoors or in a tanning bed Have had blistering sunburns, especially during childhood Have a close family member who has had a melanoma Have atypical moles or large birthmarks  Early detection of melanoma is key since treatment is typically straightforward and cure rates are extremely high if we catch it early.   The first sign of melanoma is often a change in a mole or a new dark spot.  The ABCDE system  is a way of remembering the signs of melanoma.  A for asymmetry:  The two halves do not match. B for border:  The edges of the growth are irregular. C for color:  A mixture of colors are present instead of an even brown color. D for diameter:  Melanomas are usually (but not always) greater than 73mm - the size of a pencil eraser. E for evolution:   The spot keeps changing in size, shape, and color.  Please check your skin once per month between visits. You can use a small mirror in front and a large mirror behind you to keep an eye on the back side or your body.   If you see any new or changing lesions before your next follow-up, please call to schedule a visit.  Please continue daily skin protection including broad spectrum sunscreen SPF 30+ to sun-exposed areas, reapplying every 2 hours as needed when you're outdoors.    If You Need Anything After Your Visit  If you have any questions or concerns for your doctor, please call our main line at [phone] and press option 4 to reach your doctor's medical assistant. If no one answers, please leave a voicemail as directed and we will return your call as soon as possible. Messages left after 4 pm will be answered the following business day.   You may also send [place] a message via [place]. We typically respond to MyChart messages within 1-2 business days.  For prescription refills, please ask your pharmacy to contact our office. Our fax number is [phone].  If you have an urgent issue when the clinic is closed that cannot wait until the next business day, you can page your doctor at the number below.    Please note that while we do our best to be available for urgent issues outside of office hours, we are not available 24/7.   If you have an urgent issue and are unable to reach [place], you may choose to seek medical care at your doctor's office, retail clinic, urgent care center, or emergency room.  If you have a medical emergency, please immediately call 911 or go to the emergency department.  Pager Numbers  - Dr. Nehemiah Massed: [phone]  - Dr. Laurence Ferrari: [phone]  - Dr. Nicole Kindred: [phone]  In the event of inclement weather, please call our main line at [phone] for an update on the status of any delays or closures.  Dermatology Medication Tips: Please keep the boxes that  topical medications come in in order to help keep track of the instructions about where and how to use these. Pharmacies typically print the medication instructions only on the boxes and not directly on the medication tubes.   If your medication is too expensive, please contact our office at [phone] option 4 or send [place] a message through [place].   We are unable to tell what your co-pay for medications will be in advance as this is different depending on your insurance coverage. However, we may be able to find a substitute medication at lower cost or fill out paperwork to get insurance to cover a needed medication.   If a prior authorization is required to get your medication covered by your insurance company, please allow [place] 1-2 business days to complete this process.  Drug prices often vary depending on where the prescription is filled and some pharmacies may offer cheaper prices.  The website www.goodrx.com contains coupons for medications through different pharmacies. The prices here do not account for  what the cost may be with help from insurance (it may be cheaper with your insurance), but the website can give you the price if you did not use any insurance.  - You can print the associated coupon and take it with your prescription to the pharmacy.  - You may also stop by our office during regular business hours and pick up a GoodRx coupon card.  - If you need your prescription sent electronically to a different pharmacy, notify our office through [facility] or by phone at [phone] option 4.     Si Usted Necesita Algo Despus de Su Visita  Tambin puede enviarnos un mensaje a travs de Pharmacist, community. Por lo general respondemos a los mensajes de MyChart en el transcurso de 1 a 2 das hbiles.  Para renovar recetas, por favor pida a su farmacia que se ponga en contacto con nuestra oficina. Harland Dingwall de fax es [place] [phone].  Si tiene un asunto urgente cuando la clnica  est cerrada y que no puede esperar hasta el siguiente da hbil, puede llamar/localizar a su doctor(a) al nmero que aparece a continuacin.   Por favor, tenga en cuenta que aunque hacemos todo lo posible para estar disponibles para asuntos urgentes fuera del horario de [place], no estamos disponibles las 24 horas del da, los 7 das de la [place].   Si tiene un problema urgente y no puede comunicarse con nosotros, puede optar por buscar atencin mdica  en el consultorio de su doctor(a), en una clnica privada, en un centro de atencin urgente o en una sala de emergencias.  Si tiene Engineering geologist, por favor llame inmediatamente al 911 o vaya a la sala de emergencias.  Nmeros de bper  - Dr. Nehemiah Massed: [phone]  - Dra. Moye: [phone]  - Dra. Nicole Kindred: [phone]  En caso de inclemencias del [place], por favor llame a Johnsie Kindred principal al [phone] para una actualizacin sobre el [place] de cualquier retraso o cierre.  Consejos para la medicacin en dermatologa: Por favor, guarde las cajas en las que vienen los medicamentos de uso tpico para ayudarle a seguir las instrucciones sobre dnde y cmo usarlos. Las farmacias generalmente imprimen las instrucciones del medicamento slo en las cajas y no directamente en los tubos del [place].   Si su medicamento es muy caro, por favor, pngase en contacto con Zigmund Daniel llamando al [phone] y presione la opcin 4 o envenos un mensaje a travs de Pharmacist, community.   No podemos decirle cul ser su copago por los medicamentos por adelantado ya que esto es diferente dependiendo de la cobertura de su seguro. Sin embargo, es posible que podamos encontrar un medicamento sustituto a Electrical engineer un formulario para que el seguro cubra el medicamento que se considera necesario.   Si se requiere una autorizacin previa para que su compaa de seguros [place] su medicamento, por favor permtanos de 1 a 2 das hbiles para  completar este proceso.  Los precios de los medicamentos varan con frecuencia dependiendo del Environmental consultant de dnde se surte la receta y alguna farmacias pueden ofrecer precios ms baratos.  El sitio web www.goodrx.com tiene cupones para medicamentos de Airline pilot. Los precios aqu no tienen en cuenta lo que podra costar con la ayuda del seguro (puede ser ms barato con su seguro), pero el sitio web puede darle el precio si no utiliz Research scientist (physical sciences).  - Puede imprimir el cupn correspondiente y llevarlo con su receta a la farmacia.  - Tambin puede  pasar por nuestra oficina durante el horario de atencin regular y Charity fundraiser una tarjeta de cupones de GoodRx.  - Si necesita que su receta se enve electrnicamente a una farmacia diferente, informe a nuestra oficina a travs de MyChart de [place] o por telfono llamando al [phone] y presione la opcin 4.

## 2021-05-23 NOTE — Progress Notes (Signed)
Follow-Up Visit   Subjective  Andrea Spencer is a 76 y.o. female who presents for the following: FBSE (Patient here for full body skin exam and skin cancer screening. Patient with hx of BCC and SCC. She has a new spot at chest and a spot at back and left chest that itches. ).  The following portions of the chart were reviewed this encounter and updated as appropriate:   Tobacco   Allergies   Meds   Problems   Med Hx   Surg Hx   Fam Hx       Review of Systems:  No other skin or systemic complaints except as noted in HPI or Assessment and Plan.  Objective  Well appearing patient in no apparent distress; mood and affect are within normal limits.  A full examination was performed including scalp, head, eyes, ears, nose, lips, neck, chest, axillae, abdomen, back, buttocks, bilateral upper extremities, bilateral lower extremities, hands, feet, fingers, toes, fingernails, and toenails. All findings within normal limits unless otherwise noted below.  Left Mid Back x 1, right chest x 1 Erythematous stuck-on, waxy papule or plaque  right cheek Erythematous thin papules/macules with gritty scale.   left lower eyelid 0.15 cm pink papule       Assessment & Plan  Inflamed seborrheic keratosis Left Mid Back x 1, right chest x 1  Symptomatic  Prior to procedure, discussed risks of blister formation, small wound, skin dyspigmentation, or rare scar following cryotherapy. Recommend Vaseline ointment to treated areas while healing.   Destruction of lesion - Left Mid Back x 1, right chest x 1  Destruction method: cryotherapy   Informed consent: discussed and consent obtained   Lesion destroyed using liquid nitrogen: Yes   Cryotherapy cycles:  2 Outcome: patient tolerated procedure well with no complications   Post-procedure details: wound care instructions given    AK (actinic keratosis) right cheek  Start 5-fluorouracil/calcipotriene cream twice a day for 4 days to affected  areas including right cheek. Prescription sent to [facility]. Patient provided with contact information for pharmacy and advised the pharmacy will mail the prescription to their home. Patient provided with handout reviewing treatment course and side effects and advised to call or message [place] on MyChart with any concerns.  Actinic keratoses are precancerous spots that appear secondary to cumulative UV radiation exposure/sun exposure over time. They are chronic with expected duration over 1 year. A portion of actinic keratoses will progress to squamous cell carcinoma of the skin. It is not possible to reliably predict which spots will progress to skin cancer and so treatment is recommended to prevent development of skin cancer.  Recommend daily broad spectrum sunscreen SPF 30+ to sun-exposed areas, reapply every 2 hours as needed.  Recommend staying in the shade or wearing long sleeves, sun glasses (UVA+UVB protection) and wide brim hats (4-inch brim around the entire circumference of the hat). Call for new or changing lesions.   fluorouracil (EFUDEX) 5 % cream - right cheek Apply topically 2 (two) times daily. Apply twice daily for 4 days, may repeat in 1 month.  Neoplasm of uncertain behavior of skin left lower eyelid  Skin / nail biopsy Type of biopsy: tangential   Informed consent: discussed and consent obtained   Timeout: patient name, date of birth, surgical site, and procedure verified   Patient was prepped and draped in usual sterile fashion: Area prepped with isopropyl alcohol. Anesthesia: the lesion was anesthetized in a standard fashion   Anesthetic:  1% lidocaine w/ epinephrine 1-100,000 buffered w/ 8.4% NaHCO3 Instrument used: flexible razor blade   Hemostasis achieved with: aluminum chloride   Outcome: patient tolerated procedure well   Post-procedure details: wound care instructions given   Additional details:  Mupirocin and a bandage applied  Specimen 1 - Surgical  pathology Differential Diagnosis: r/o BCC  Check Margins: No 0.15 cm pink papule Small specimen   If + will plan Mohs with Dr. Manley Mason.    Lentigines - Scattered tan macules - Due to sun exposure - Benign-appearing, observe - Recommend daily broad spectrum sunscreen SPF 30+ to sun-exposed areas, reapply every 2 hours as needed. - Call for any changes  Seborrheic Keratoses - Stuck-on, waxy, tan-brown papules and/or plaques  - Benign-appearing - Discussed benign etiology and prognosis. - Observe - Call for any changes  Melanocytic Nevi - Tan-brown and/or pink-flesh-colored symmetric macules and papules - Benign appearing on exam today - Observation - Call clinic for new or changing moles - Recommend daily use of broad spectrum spf 30+ sunscreen to sun-exposed areas.   Hemangiomas - Red papules - Discussed benign nature - Observe - Call for any changes  Actinic Damage - Chronic condition, secondary to cumulative UV/sun exposure - diffuse scaly erythematous macules with underlying dyspigmentation - Recommend daily broad spectrum sunscreen SPF 30+ to sun-exposed areas, reapply every 2 hours as needed.  - Staying in the shade or wearing long sleeves, sun glasses (UVA+UVB protection) and wide brim hats (4-inch brim around the entire circumference of the hat) are also recommended for sun protection.  - Call for new or changing lesions.  Skin cancer screening performed today.  History of Basal Cell Carcinoma of the Skin - No evidence of recurrence today - Recommend regular full body skin exams - Recommend daily broad spectrum sunscreen SPF 30+ to sun-exposed areas, reapply every 2 hours as needed.  - Call if any new or changing lesions are noted between office visits  History of PreCancerous Actinic Keratosis  - site(s) of PreCancerous Actinic Keratosis clear today. - these may recur and new lesions may form requiring treatment to prevent transformation into skin  cancer - observe for new or changing spots and contact [facility] for appointment if occur - photoprotection with sun protective clothing; sunglasses and broad spectrum sunscreen with SPF of at least 30 + and frequent self skin exams recommended - yearly exams by a dermatologist recommended for persons with history of PreCancerous Actinic Keratoses  Return in about 6 months (around 11/20/2021) for TBSE.  Graciella Belton, RMA, am acting as scribe for Forest Gleason, MD .  Documentation: I have reviewed the above documentation for accuracy and completeness, and I agree with the above.  Forest Gleason, MD

## 2021-05-24 ENCOUNTER — Telehealth: Payer: Self-pay

## 2021-05-24 DIAGNOSIS — C441192 Basal cell carcinoma of skin of left lower eyelid, including canthus: Secondary | ICD-10-CM

## 2021-05-24 NOTE — Telephone Encounter (Signed)
-----   Message from Alfonso Patten, MD sent at 05/24/2021  4:02 PM EST ----- Skin , left lower eyelid BASAL CELL CARCINOMA, NODULAR PATTERN, BASE INVOLVED --> Mohs surgery  MAs please call and let me know if patient has questions for me or wants to discuss with me. Thank you!

## 2021-05-24 NOTE — Telephone Encounter (Signed)
Patient advised bx showed BCC, will refer for Mohs to [facility] Dr. Merritt./js

## 2021-06-26 DIAGNOSIS — K5909 Other constipation: Secondary | ICD-10-CM | POA: Diagnosis not present

## 2021-06-26 DIAGNOSIS — Z1211 Encounter for screening for malignant neoplasm of colon: Secondary | ICD-10-CM | POA: Diagnosis not present

## 2021-06-27 ENCOUNTER — Ambulatory Visit: Payer: PPO

## 2021-06-28 ENCOUNTER — Ambulatory Visit (INDEPENDENT_AMBULATORY_CARE_PROVIDER_SITE_OTHER): Payer: PPO

## 2021-06-28 ENCOUNTER — Telehealth: Payer: Self-pay

## 2021-06-28 VITALS — Ht 61.5 in | Wt 122.0 lb

## 2021-06-28 DIAGNOSIS — Z0000 Encounter for general adult medical examination without abnormal findings: Secondary | ICD-10-CM | POA: Diagnosis not present

## 2021-06-28 NOTE — Patient Instructions (Signed)
Andrea Spencer , Thank you for taking time to come for your Medicare Wellness Visit. I appreciate your ongoing commitment to your health goals. Please review the following plan we discussed and let me know if I can assist you in the future.   These are the goals we discussed:  Goals      Maintain Healthy Lifestyle     Stay active Healthy diet        This is a list of the screening recommended for you and due dates:  Health Maintenance  Topic Date Due   Mammogram  08/01/2022   Cologuard (Stool DNA test)  06/09/2023   Tetanus Vaccine  12/30/2028   Pneumonia Vaccine  Completed   Flu Shot  Completed   DEXA scan (bone density measurement)  Completed   COVID-19 Vaccine  Completed   Hepatitis C Screening: USPSTF Recommendation to screen - Ages 68-79 yo.  Completed   Zoster (Shingles) Vaccine  Completed   HPV Vaccine  Aged Out    Advanced directives: not yet completed  Conditions/risks identified: none new  Next appointment: Follow up in one year for your annual wellness visit    Preventive Care 65 Years and Older, Female Preventive care refers to lifestyle choices and visits with your health care provider that can promote health and wellness. What does preventive care include? A yearly physical exam. This is also called an annual well check. Dental exams once or twice a year. Routine eye exams. Ask your health care provider how often you should have your eyes checked. Personal lifestyle choices, including: Daily care of your teeth and gums. Regular physical activity. Eating a healthy diet. Avoiding tobacco and drug use. Limiting alcohol use. Practicing safe sex. Taking low-dose aspirin every day. Taking vitamin and mineral supplements as recommended by your health care provider. What happens during an annual well check? The services and screenings done by your health care provider during your annual well check will depend on your age, overall health, lifestyle risk factors,  and family history of disease. Counseling  Your health care provider may ask you questions about your: Alcohol use. Tobacco use. Drug use. Emotional well-being. Home and relationship well-being. Sexual activity. Eating habits. History of falls. Memory and ability to understand (cognition). Work and work Astronomer. Reproductive health. Screening  You may have the following tests or measurements: Height, weight, and BMI. Blood pressure. Lipid and cholesterol levels. These may be checked every 5 years, or more frequently if you are over 51 years old. Skin check. Lung cancer screening. You may have this screening every year starting at age 38 if you have a 30-pack-year history of smoking and currently smoke or have quit within the past 15 years. Fecal occult blood test (FOBT) of the stool. You may have this test every year starting at age 107. Flexible sigmoidoscopy or colonoscopy. You may have a sigmoidoscopy every 5 years or a colonoscopy every 10 years starting at age 2. Hepatitis C blood test. Hepatitis B blood test. Sexually transmitted disease (STD) testing. Diabetes screening. This is done by checking your blood sugar (glucose) after you have not eaten for a while (fasting). You may have this done every 1-3 years. Bone density scan. This is done to screen for osteoporosis. You may have this done starting at age 54. Mammogram. This may be done every 1-2 years. Talk to your health care provider about how often you should have regular mammograms. Talk with your health care provider about your test results, treatment options,  and if necessary, the need for more tests. Vaccines  Your health care provider may recommend certain vaccines, such as: Influenza vaccine. This is recommended every year. Tetanus, diphtheria, and acellular pertussis (Tdap, Td) vaccine. You may need a Td booster every 10 years. Zoster vaccine. You may need this after age 42. Pneumococcal 13-valent conjugate  (PCV13) vaccine. One dose is recommended after age 39. Pneumococcal polysaccharide (PPSV23) vaccine. One dose is recommended after age 22. Talk to your health care provider about which screenings and vaccines you need and how often you need them. This information is not intended to replace advice given to you by your health care provider. Make sure you discuss any questions you have with your health care provider. Document Released: 05/12/2015 Document Revised: 01/03/2016 Document Reviewed: 02/14/2015 Elsevier Interactive Patient Education  2017 [employer].  Fall Prevention in the Home Falls can cause injuries. They can happen to people of all ages. There are many things you can do to make your home safe and to help prevent falls. What can I do on the outside of my home? Regularly fix the edges of walkways and driveways and fix any cracks. Remove anything that might make you trip as you walk through a door, such as a raised step or threshold. Trim any bushes or trees on the path to your home. Use bright outdoor lighting. Clear any walking paths of anything that might make someone trip, such as rocks or tools. Regularly check to see if handrails are loose or broken. Make sure that both sides of any steps have handrails. Any raised decks and porches should have guardrails on the edges. Have any leaves, snow, or ice cleared regularly. Use sand or salt on walking paths during winter. Clean up any spills in your garage right away. This includes oil or grease spills. What can I do in the bathroom? Use night lights. Install grab bars by the toilet and in the tub and shower. Do not use towel bars as grab bars. Use non-skid mats or decals in the tub or shower. If you need to sit down in the shower, use a plastic, non-slip stool. Keep the floor dry. Clean up any water that spills on the floor as soon as it happens. Remove soap buildup in the tub or shower regularly. Attach bath mats securely with  double-sided non-slip rug tape. Do not have throw rugs and other things on the floor that can make you trip. What can I do in the bedroom? Use night lights. Make sure that you have a light by your bed that is easy to reach. Do not use any sheets or blankets that are too big for your bed. They should not hang down onto the floor. Have a firm chair that has side arms. You can use this for support while you get dressed. Do not have throw rugs and other things on the floor that can make you trip. What can I do in the kitchen? Clean up any spills right away. Avoid walking on wet floors. Keep items that you use a lot in easy-to-reach places. If you need to reach something above you, use a strong step stool that has a grab bar. Keep electrical cords out of the way. Do not use floor polish or wax that makes floors slippery. If you must use wax, use non-skid floor wax. Do not have throw rugs and other things on the floor that can make you trip. What can I do with my stairs? Do not leave  any items on the stairs. Make sure that there are handrails on both sides of the stairs and use them. Fix handrails that are broken or loose. Make sure that handrails are as long as the stairways. Check any carpeting to make sure that it is firmly attached to the stairs. Fix any carpet that is loose or worn. Avoid having throw rugs at the top or bottom of the stairs. If you do have throw rugs, attach them to the floor with carpet tape. Make sure that you have a light switch at the top of the stairs and the bottom of the stairs. If you do not have them, ask someone to add them for you. What else can I do to help prevent falls? Wear shoes that: Do not have high heels. Have rubber bottoms. Are comfortable and fit you well. Are closed at the toe. Do not wear sandals. If you use a stepladder: Make sure that it is fully opened. Do not climb a closed stepladder. Make sure that both sides of the stepladder are locked  into place. Ask someone to hold it for you, if possible. Clearly mark and make sure that you can see: Any grab bars or handrails. First and last steps. Where the edge of each step is. Use tools that help you move around (mobility aids) if they are needed. These include: Canes. Walkers. Scooters. Crutches. Turn on the lights when you go into a dark area. Replace any light bulbs as soon as they burn out. Set up your furniture so you have a clear path. Avoid moving your furniture around. If any of your floors are uneven, fix them. If there are any pets around you, be aware of where they are. Review your medicines with your doctor. Some medicines can make you feel dizzy. This can increase your chance of falling. Ask your doctor what other things that you can do to help prevent falls. This information is not intended to replace advice given to you by your health care provider. Make sure you discuss any questions you have with your health care provider. Document Released: 02/09/2009 Document Revised: 09/21/2015 Document Reviewed: 05/20/2014 Elsevier Interactive Patient Education  2017 [employer].

## 2021-06-28 NOTE — Telephone Encounter (Signed)
No answer when called for scheduled AWV. Left message. Reschedule. ?

## 2021-06-28 NOTE — Progress Notes (Signed)
Subjective:   Andrea Spencer is a 76 y.o. female who presents for Medicare Annual (Subsequent) preventive examination.  Review of Systems    No ROS.  Medicare Wellness Virtual Visit.  Visual/audio telehealth visit, UTA vital signs.   See social history for additional risk factors.   Cardiac Risk Factors include: advanced age (>14men, >84 women)     Objective:    Today's Vitals   06/28/21 1040  Weight: 122 lb (55.3 kg)  Height: 5' 1.5" (1.562 m)   Body mass index is 22.68 kg/m.  Advanced Directives 06/28/2021 06/26/2020 04/01/2019 03/23/2018 01/18/2016 04/05/2015 03/17/2015  Does Patient Have a Medical Advance Directive? No Yes No Yes No No No  Type of Advance Directive - [place];Living will - [place];Living will - - -  Does patient want to make changes to medical advance directive? - No - Patient declined - No - Patient declined - - -  Copy of [place] in Chart? - No - copy requested - No - copy requested - - -  Would patient like information on creating a medical advance directive? No - Patient declined - No - Patient declined - No - patient declined information No - patient declined information -   Current Medications (verified) Outpatient Encounter Medications as of 06/28/2021  Medication Sig   Alpha Lipoic Acid-Vitamin E (LIPO-GEL PO) Take 2 capsules by mouth.   Acetaminophen (TYLENOL ARTHRITIS PAIN PO) Take by mouth.   Apoaequorin (PREVAGEN EXTRA STRENGTH PO) Take by mouth.   aspirin 81 MG tablet Take 81 mg by mouth daily.   atorvastatin (LIPITOR) 10 MG tablet Take 1 tablet (10 mg total) by mouth daily at 6 PM.   Biotin 10 MG CAPS Take 1,000 mg by mouth daily.    Calcium Carb-Cholecalciferol ([employer] CALCIUM 600 & VITAMIN D3 PO) Take by mouth 2 (two) times daily after a meal.   cholecalciferol (VITAMIN D) 1000 UNITS tablet Take 1,000 Units by mouth daily.   fluorouracil (EFUDEX) 5 % cream Apply topically 2 (two)  times daily. Apply twice daily for 4 days, may repeat in 1 month.   ketoconazole (NIZORAL) 2 % shampoo apply three times per week, massage into scalp and leave in for 10 minutes before rinsing out   Krill Oil 1000 MG CAPS Take 3 capsules by mouth daily.    Multiple Vitamin (MULTIVITAMIN WITH MINERALS) TABS tablet Take 1 tablet by mouth daily.   scopolamine (TRANSDERM-SCOP) 1 MG/3DAYS Place 1 patch (1.5 mg total) onto the skin every 3 (three) days. Motion sickness   triamcinolone ointment (KENALOG) 0.1 % Apply twice daily to affected areas up to 2 weeks as needed for itch. Avoid applying to face, groin, and axilla. Use as directed. Risk of skin atrophy with long-term use reviewed.   TURMERIC PO Take by mouth daily.   vitamin C (ASCORBIC ACID) 250 MG tablet Take 500 mg by mouth daily. Taking two tablets daily   No facility-administered encounter medications on file as of 06/28/2021.    Allergies (verified) Aspartame and phenylalanine, Meloxicam, and Other   History: Past Medical History:  Diagnosis Date   Arthritis    Basal cell carcinoma 12/10/2017   Left alar crease. Micronodular pattern   Basal cell carcinoma 12/10/2017   Left upper back. Nodular.    Basal cell carcinoma 12/10/2017   Right medial mid back. Superficial.   Basal cell carcinoma 12/10/2017   Right lateral mid back. Nodular   Basal cell  carcinoma 12/24/2018   Right mid back. Nodular   Basal cell carcinoma 12/24/2018   Left anterior shoulder. Nodular   BCC (basal cell carcinoma) 05/23/2021   left lower eyelid, Mohs   Breast mass    right   Cancer (HCC)    skin cancer SCC, BCC multiple    COVID-19    11/22/20   Headache    History of bronchitis    History of pneumonia    Hyperlipidemia    Past Surgical History:  Procedure Laterality Date   BREAST LUMPECTOMY     right 2016 negative malignancy fibrocystic changes    COLONOSCOPY     DILATION AND CURETTAGE OF UTERUS     JOINT REPLACEMENT     left    MOHS  SURGERY     x2 face    MOHS SURGERY     L alar 02/04/18    RADIOACTIVE SEED GUIDED EXCISIONAL BREAST BIOPSY Right 04/06/2015   Procedure: RADIOACTIVE SEED GUIDED EXCISIONAL RIGHT BREAST BIOPSY;  Surgeon: Stark Klein, MD;  Location: [facility];  Service: General;  Laterality: Right;   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   Procedure: TOTAL HIP ARTHROPLASTY ANTERIOR APPROACH;  Surgeon: Renette Butters, MD;  Location: [facility];  Service: Orthopedics;  Laterality: Left;   WISDOM TOOTH EXTRACTION     Family History  Problem Relation Age of Onset   Stroke Mother    Alzheimer's disease Father    Social History   Socioeconomic History   Marital status: Widowed    Spouse name: Not on file   Number of children: Not on file   Years of education: Not on file   Highest education level: Not on file  Occupational History   Not on file  Tobacco Use   Smoking status: Never   Smokeless tobacco: Never  Substance and Sexual Activity   Alcohol use: Yes    Comment: social   Drug use: No   Sexual activity: Not Currently  Other Topics Concern   Not on file  Social History Narrative   Widowed husband died November 04, 2018    1 son lives in [place]; also has step kids (son and daughter though deceased husband [place])   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    Owns guns, wear seat belt    Safe in relationship    1 dog Viszla named Biochemist, clinical   Social Determinants of Radio broadcast assistant Strain: Low Risk    Difficulty of Paying Living Expenses: Not hard at all  Food Insecurity: No Food Insecurity   Worried About Charity fundraiser in the Last Year: Never true   Arboriculturist in the Last Year: Never true  Transportation Needs: No Transportation Needs   Lack of Transportation (Medical): No   Lack of Transportation (Non-Medical): No  Physical Activity: Sufficiently Active   Days of Exercise per Week: 5 days   Minutes of Exercise per Session: 60 min  Stress: No Stress Concern Present   Feeling of Stress  : Not at all  Social Connections: Unknown   Frequency of Communication with Friends and Family: More than three times a week   Frequency of Social Gatherings with Friends and Family: More than three times a week   Attends Religious Services: Not on file   Active Member of Clubs or Organizations: Not on file   Attends Archivist Meetings: Not on file   Marital Status: Not on file    Tobacco  Counseling Counseling given: Not Answered   Clinical Intake:  Pre-visit preparation completed: Yes        Diabetes: No  How often do you need to have someone help you when you read instructions, pamphlets, or other written materials from your doctor or pharmacy?: 1 - Never    Interpreter Needed?: No      Activities of Daily Living In your present state of health, do you have any difficulty performing the following activities: 06/28/2021  Hearing? N  Vision? N  Difficulty concentrating or making decisions? N  Walking or climbing stairs? N  Dressing or bathing? N  Doing errands, shopping? N  Preparing Food and eating ? N  Using the Toilet? N  In the past six months, have you accidently leaked urine? N  Do you have problems with loss of bowel control? N  Managing your Medications? N  Managing your Finances? N  Housekeeping or managing your Housekeeping? N  Some recent data might be hidden    Patient Care Team: McLean-Scocuzza, Nino Glow, MD as PCP - General (Internal Medicine)  Indicate any recent Medical Services you may have received from other than [facility] providers in the past year (date may be approximate).     Assessment:   This is a routine wellness examination for Andrea Spencer.  Virtual Visit via Telephone Note  I connected with  Andrea Spencer on 06/28/21 at 10:30 AM EST by telephone and verified that I am speaking with the correct person using two identifiers.  Persons participating in the virtual visit: patient/Nurse Health Advisor   I discussed the  limitations, risks, security and privacy concerns of performing an evaluation and management service by telephone and the availability of in person appointments. The patient expressed understanding and agreed to proceed.  Interactive audio and video telecommunications were attempted between this nurse and patient, however failed, due to patient having technical difficulties OR patient did not have access to video capability.  We continued and completed visit with audio only.  Some vital signs may be absent or patient reported.   Hearing/Vision screen Hearing Screening - Comments:: Patient is able to hear conversational tones without difficulty. Notes ringing in ears. Taking OTC medication. Declines further follow up at this time.  Vision Screening - Comments:: Followed by [facility] Wears corrective lenses  They have seen their ophthalmologist in the last 12 months.   Dietary issues and exercise activities discussed: Current Exercise Habits: Home exercise routine, Type of exercise: strength training/weights;calisthenics, Time (Minutes): 60, Frequency (Times/Week): 5, Weekly Exercise (Minutes/Week): 300, Intensity: Mild Regular diet Good water intake   Goals Addressed             This Visit's Progress    Maintain Healthy Lifestyle       Stay active Healthy diet       Depression Screen PHQ 2/9 Scores 06/28/2021 04/03/2021 06/26/2020 11/25/2019 08/19/2019 04/01/2019 03/23/2018  PHQ - 2 Score 0 0 0 0 0 0 0    Fall Risk Fall Risk  06/28/2021 04/03/2021 11/29/2020 06/26/2020 05/30/2020  Falls in the past year? 0 0 0 0 0  Comment - - - - -  Number falls in past yr: 0 0 0 0 0  Injury with Fall? - 0 0 0 0  Risk for fall due to : - No Fall Risks No Fall Risks - -  Follow up Falls evaluation completed Falls evaluation completed Falls evaluation completed Falls evaluation completed Falls evaluation completed   FALL RISK PREVENTION  PERTAINING TO THE HOME: Home free of loose throw rugs in  walkways, pet beds, electrical cords, etc? Yes  Adequate lighting in your home to reduce risk of falls? Yes   ASSISTIVE DEVICES UTILIZED TO PREVENT FALLS: Use of a cane, walker or w/c? No   TIMED UP AND GO: Was the test performed? No .   Cognitive Function: Patient is alert and oriented x3.  Enjoys reading, participates with [facility] [place] and other brain health stimulating activities.  Lives alone. MMSE - Mini Mental State Exam 03/23/2018  Orientation to time 5  Orientation to Place 5  Registration 3  Attention/ Calculation 5  Recall 3  Language- name 2 objects 2  Language- repeat 1  Language- follow 3 step command 3  Language- read & follow direction 1  Write a sentence 1  Copy design 1  Total score 30     6CIT Screen 04/01/2019  What Year? 0 points  What month? 0 points  What time? 0 points  Count back from 20 0 points  Months in reverse 0 points  Repeat phrase 0 points  Total Score 0   Immunizations Immunization History  Administered Date(s) Administered   Fluad Quad(high Dose 65+) 12/29/2018   Influenza, High Dose Seasonal PF 12/26/2017, 01/08/2021   Influenza-Unspecified 12/17/2019   PFIZER Comirnaty(Gray Top)Covid-19 Tri-Sucrose Vaccine 08/16/2020   PFIZER(Purple Top)SARS-COV-2 Vaccination 05/24/2019, 06/14/2019, 01/27/2020   Pfizer Covid-19 Vaccine Bivalent Booster 70yrs & up 02/22/2021   Pneumococcal Conjugate-13 06/18/2013   Pneumococcal Polysaccharide-23 12/26/2017   Tdap 12/31/2018   Zoster Recombinat (Shingrix) 12/26/2017, 05/15/2018   Screening Tests Health Maintenance  Topic Date Due   MAMMOGRAM  08/01/2022   Fecal DNA (Cologuard)  06/09/2023   TETANUS/TDAP  12/30/2028   Pneumonia Vaccine 61+ Years old  Completed   INFLUENZA VACCINE  Completed   DEXA SCAN  Completed   COVID-19 Vaccine  Completed   Hepatitis C Screening  Completed   Zoster Vaccines- Shingrix  Completed   HPV VACCINES  Aged Out   Health Maintenance There are no preventive  care reminders to display for this patient.  Mammogram- scheduled 08/02/21.  Lung Cancer Screening: (Low Dose CT Chest recommended if Age 64-80 years, 30 pack-year currently smoking OR have quit w/in 15years.) does not qualify.   Vision Screening: Recommended annual ophthalmology exams for early detection of glaucoma and other disorders of the eye.  Dental Screening: Recommended annual dental exams for proper oral hygiene  Community Resource Referral / Chronic Care Management: CRR required this visit?  No   CCM required this visit?  No      Plan:   Keep all routine maintenance appointments.   I have personally reviewed and noted the following in the patients chart:   Medical and social history Use of alcohol, tobacco or illicit drugs  Current medications and supplements including opioid prescriptions.  Functional ability and status Nutritional status Physical activity Advanced directives List of other physicians Hospitalizations, surgeries, and ER visits in previous 12 months Vitals Screenings to include cognitive, depression, and falls Referrals and appointments  In addition, I have reviewed and discussed with patient certain preventive protocols, quality metrics, and best practice recommendations. A written personalized care plan for preventive services as well as general preventive health recommendations were provided to patient via mychart.     Varney Biles, LPN   06/04/6387

## 2021-07-02 DIAGNOSIS — C441192 Basal cell carcinoma of skin of left lower eyelid, including canthus: Secondary | ICD-10-CM | POA: Diagnosis not present

## 2021-07-03 ENCOUNTER — Encounter: Payer: Self-pay | Admitting: Internal Medicine

## 2021-08-02 ENCOUNTER — Ambulatory Visit
Admission: RE | Admit: 2021-08-02 | Discharge: 2021-08-02 | Disposition: A | Discharge status: Home / Self Care | Payer: PPO | Source: Ambulatory Visit | Attending: Internal Medicine | Admitting: Internal Medicine

## 2021-08-02 DIAGNOSIS — Z1231 Encounter for screening mammogram for malignant neoplasm of breast: Secondary | ICD-10-CM

## 2021-09-04 ENCOUNTER — Telehealth: Payer: Self-pay

## 2021-09-04 NOTE — Telephone Encounter (Signed)
Updating specimen tracking and history from [facility] progress notes and photos. aw ?

## 2021-09-14 ENCOUNTER — Encounter: Payer: Self-pay | Admitting: Gastroenterology

## 2021-09-17 ENCOUNTER — Other Ambulatory Visit: Payer: Self-pay

## 2021-09-17 ENCOUNTER — Encounter: Payer: Self-pay | Admitting: Gastroenterology

## 2021-09-17 ENCOUNTER — Ambulatory Visit: Payer: PPO | Admitting: Anesthesiology

## 2021-09-17 ENCOUNTER — Encounter
Admission: RE | Disposition: A | Discharge status: Home / Self Care | Payer: Self-pay | Source: Home / Self Care | Attending: Gastroenterology

## 2021-09-17 ENCOUNTER — Ambulatory Visit
Admission: RE | Admit: 2021-09-17 | Discharge: 2021-09-17 | Disposition: A | Discharge status: Home / Self Care | Payer: PPO | Attending: Gastroenterology | Admitting: Gastroenterology

## 2021-09-17 DIAGNOSIS — K640 First degree hemorrhoids: Secondary | ICD-10-CM | POA: Insufficient documentation

## 2021-09-17 DIAGNOSIS — D124 Benign neoplasm of descending colon: Secondary | ICD-10-CM | POA: Diagnosis not present

## 2021-09-17 DIAGNOSIS — K635 Polyp of colon: Secondary | ICD-10-CM | POA: Diagnosis not present

## 2021-09-17 DIAGNOSIS — K621 Rectal polyp: Secondary | ICD-10-CM | POA: Insufficient documentation

## 2021-09-17 DIAGNOSIS — Z1211 Encounter for screening for malignant neoplasm of colon: Secondary | ICD-10-CM | POA: Insufficient documentation

## 2021-09-17 DIAGNOSIS — R519 Headache, unspecified: Secondary | ICD-10-CM | POA: Insufficient documentation

## 2021-09-17 DIAGNOSIS — K5730 Diverticulosis of large intestine without perforation or abscess without bleeding: Secondary | ICD-10-CM | POA: Diagnosis not present

## 2021-09-17 DIAGNOSIS — M1990 Unspecified osteoarthritis, unspecified site: Secondary | ICD-10-CM | POA: Insufficient documentation

## 2021-09-17 DIAGNOSIS — K648 Other hemorrhoids: Secondary | ICD-10-CM | POA: Diagnosis not present

## 2021-09-17 HISTORY — PX: COLONOSCOPY WITH PROPOFOL: SHX5780

## 2021-09-17 LAB — HM COLONOSCOPY

## 2021-09-17 SURGERY — COLONOSCOPY WITH PROPOFOL
Anesthesia: General

## 2021-09-17 MED ORDER — SODIUM CHLORIDE 0.9 % IV SOLN: INTRAVENOUS | Status: DC

## 2021-09-17 MED ORDER — PROPOFOL 500 MG/50ML IV EMUL
INTRAVENOUS | Status: DC | PRN
  Administered 2021-09-17: 120 ug/kg/min via INTRAVENOUS

## 2021-09-17 MED ORDER — PROPOFOL 500 MG/50ML IV EMUL
INTRAVENOUS | Status: AC
  Filled 2021-09-17: qty 50

## 2021-09-17 NOTE — Op Note (Signed)
[facility] Gastroenterology Patient Name: Andrea Spencer Procedure Date: 09/17/2021 8:23 AM MRN: [id number] Account #: [id number] Date of Birth: 22-May-1945 Admit Type: Outpatient Age: 76 Room: [facility] ENDO ROOM 2 Gender: Female Note Status: [place] Instrument Name: Peds Colonoscope [id number] Procedure:             Colonoscopy Indications:           Screening for colorectal malignant neoplasm Providers:             [name] DO, DO Medicines:             Monitored Anesthesia Care Complications:         No immediate complications. Estimated blood loss:                         Minimal. Procedure:             Pre-Anesthesia Assessment:                        - Prior to the procedure, a History and Physical was                         performed, and patient medications and allergies were                         reviewed. The patient is competent. The risks and                         benefits of the procedure and the sedation options and                         risks were discussed with the patient. All questions                         were answered and informed consent was obtained.                         Patient identification and proposed procedure were                         verified by the physician, the nurse, the anesthetist                         and the technician in the endoscopy suite. Mental                         Status Examination: alert and oriented. Airway                         Examination: normal oropharyngeal airway and neck                         mobility. Respiratory Examination: clear to                         auscultation. CV Examination: RRR, no murmurs, no S3                         or S4. Prophylactic Antibiotics: The patient does  not                         require prophylactic antibiotics. Prior                         Anticoagulants: The patient has taken no previous                         anticoagulant or antiplatelet  agents. ASA Grade                         Assessment: II - A patient with mild systemic disease.                         After reviewing the risks and benefits, the patient                         was deemed in satisfactory condition to undergo the                         procedure. The anesthesia plan was to use monitored                         anesthesia care (MAC). Immediately prior to                         administration of medications, the patient was                         re-assessed for adequacy to receive sedatives. The                         heart rate, respiratory rate, oxygen saturations,                         blood pressure, adequacy of pulmonary ventilation, and                         response to care were monitored throughout the                         procedure. The physical status of the patient was                         re-assessed after the procedure.                        After obtaining informed consent, the colonoscope was                         passed under direct vision. Throughout the procedure,                         the patient's blood pressure, pulse, and oxygen                         saturations were monitored continuously. The  Colonoscope was introduced through the anus and                         advanced to the the cecum, identified by appendiceal                         orifice and ileocecal valve. The colonoscopy was                         performed without difficulty. The patient tolerated                         the procedure well. The quality of the bowel                         preparation was evaluated using the BBPS [facility] Bowel                         Preparation Scale) with scores of: Right Colon = 3,                         Transverse Colon = 3 and Left Colon = 3 (entire mucosa                         seen well with no residual staining, small fragments                         of stool or opaque liquid). The  total BBPS score                         equals 9. The ileocecal valve, appendiceal orifice,                         and rectum were photographed. Findings:      The perianal and digital rectal examinations were normal. Pertinent       negatives include normal sphincter tone.      Multiple small-mouthed diverticula were found in the recto-sigmoid colon       and sigmoid colon. Estimated blood loss: none.      Non-bleeding internal hemorrhoids were found during retroflexion. The       hemorrhoids were Grade I (internal hemorrhoids that do not prolapse).       Estimated blood loss: none.      A 4 to 6 mm polyp was found in the descending colon. The polyp was       sessile. The polyp was removed with a cold snare. Resection and       retrieval were complete. Estimated blood loss was minimal.      A 1 to 2 mm polyp was found in the rectum. The polyp was sessile. The       polyp was removed with a jumbo cold forceps. Resection and retrieval       were complete. Estimated blood loss was minimal.      The exam was otherwise without abnormality on direct and retroflexion       views. Impression:            - Diverticulosis in the recto-sigmoid colon and in the  sigmoid colon.                        - Non-bleeding internal hemorrhoids.                        - One 4 to 6 mm polyp in the descending colon, removed                         with a cold snare. Resected and retrieved.                        - One 1 to 2 mm polyp in the rectum, removed with a                         jumbo cold forceps. Resected and retrieved.                        - The examination was otherwise normal on direct and                         retroflexion views. Recommendation:        - Discharge patient to home.                        - Resume previous diet.                        - Continue present medications.                        - No aspirin, ibuprofen, naproxen, or other                          non-steroidal anti-inflammatory drugs for 5 days after                         polyp removal.                        - Await pathology results.                        - Repeat colonoscopy for surveillance based on                         pathology results.                        - Return to referring physician as previously                         scheduled.                        - The findings and recommendations were discussed with                         the patient. Procedure Code(s):     --- Professional ---  45385, Colonoscopy, flexible; with removal of                         tumor(s), polyp(s), or other lesion(s) by snare                         technique                        45380, 59, Colonoscopy, flexible; with biopsy, single                         or multiple Diagnosis Code(s):     --- Professional ---                        Z12.11, Encounter for screening for malignant neoplasm                         of colon                        K64.0, First degree hemorrhoids                        K63.5, Polyp of colon                        K62.1, Rectal polyp                        K57.30, Diverticulosis of large intestine without                         perforation or abscess without bleeding CPT copyright 2019 American Medical Association. All rights reserved. The codes documented in this report are preliminary and upon coder review may  be revised to meet current compliance requirements. Attending Participation:      I personally performed the entire procedure. Volney American, DO [name] DO, DO 09/17/2021 9:14:21 AM This report has been signed electronically. Number of Addenda: 0 Note Initiated On: 09/17/2021 8:23 AM Scope Withdrawal Time: 0 hours 18 minutes 52 seconds  Total Procedure Duration: 0 hours 33 minutes 41 seconds  Estimated Blood Loss:  Estimated blood loss was minimal.      [facility]

## 2021-09-17 NOTE — Anesthesia Preprocedure Evaluation (Signed)
Anesthesia Evaluation  Patient identified by MRN, date of birth, ID band Patient awake    Reviewed: Allergy & Precautions, NPO status , Patient's Chart, lab work & pertinent test results  Airway Mallampati: III  TM Distance: >3 FB Neck ROM: full    Dental  (+) Teeth Intact, Dental Advisory Given   Pulmonary neg pulmonary ROS,    Pulmonary exam normal breath sounds clear to auscultation       Cardiovascular Exercise Tolerance: Good negative cardio ROS Normal cardiovascular exam Rhythm:Regular     Neuro/Psych  Headaches, negative neurological ROS  negative psych ROS   GI/Hepatic negative GI ROS, Neg liver ROS,   Endo/Other  negative endocrine ROS  Renal/GU negative Renal ROS  negative genitourinary   Musculoskeletal  (+) Arthritis ,   Abdominal Normal abdominal exam  (+)   Peds negative pediatric ROS (+)  Hematology negative hematology ROS (+)   Anesthesia Other Findings Past Medical History: No date: Arthritis 12/10/2017: Basal cell carcinoma     Comment:  Left alar crease. Micronodular pattern 12/10/2017: Basal cell carcinoma     Comment:  Left upper back. Nodular.  12/10/2017: Basal cell carcinoma     Comment:  Right medial mid back. Superficial. 12/10/2017: Basal cell carcinoma     Comment:  Right lateral mid back. Nodular 12/24/2018: Basal cell carcinoma     Comment:  Right mid back. Nodular 12/24/2018: Basal cell carcinoma     Comment:  Left anterior shoulder. Nodular No date: BCC (basal cell carcinoma of skin)     Comment:  07/02/21 left lower eyelid mohs Dr Shelda Jakes 05/23/2021: BCC (basal cell carcinoma)     Comment:  left lower eyelid, Mohs completed 07/02/2021 No date: Breast mass     Comment:  right No date: Cancer [facility])     Comment:  skin cancer SCC, BCC multiple  No date: COVID-19     Comment:  11/22/20 No date: Headache No date: History of bronchitis No date: History of pneumonia No date:  Hyperlipidemia  Past Surgical History: 2016: BREAST LUMPECTOMY; Right     Comment:  right 2016 negative malignancy fibrocystic changes  No date: COLONOSCOPY No date: DILATION AND CURETTAGE OF UTERUS No date: JOINT REPLACEMENT     Comment:  left  No date: MASTECTOMY PARTIAL / LUMPECTOMY     Comment:  10-15 years ago No date: MOHS SURGERY     Comment:  x2 face  No date: MOHS SURGERY     Comment:  L alar 02/04/18  04/06/2015: RADIOACTIVE SEED GUIDED EXCISIONAL BREAST BIOPSY; Right     Comment:  Procedure: RADIOACTIVE SEED GUIDED EXCISIONAL RIGHT               BREAST BIOPSY;  Surgeon: Stark Klein, MD;  Location: [place];  Service: General;  Laterality: Right; 01/30/2016: TOTAL HIP ARTHROPLASTY; Left     Comment:  Procedure: TOTAL HIP ARTHROPLASTY ANTERIOR APPROACH;                Surgeon: Renette Butters, MD;  Location: [place];                Service: Orthopedics;  Laterality: Left; No date: WISDOM TOOTH EXTRACTION     Reproductive/Obstetrics negative OB ROS                             Anesthesia Physical  Anesthesia Plan  ASA: 2  Anesthesia Plan: General   Post-op Pain Management:    Induction: Intravenous  PONV Risk Score and Plan: Propofol infusion and TIVA  Airway Management Planned: Natural Airway and Nasal Cannula  Additional Equipment:   Intra-op Plan:   Post-operative Plan:   Informed Consent: I have reviewed the patients History and Physical, chart, labs and discussed the procedure including the risks, benefits and alternatives for the proposed anesthesia with the patient or authorized representative who has indicated his/her understanding and acceptance.     Dental Advisory Given  Plan Discussed with: Anesthesiologist, CRNA and Surgeon  Anesthesia Plan Comments:         Anesthesia Quick Evaluation

## 2021-09-17 NOTE — Transfer of Care (Signed)
Immediate Anesthesia Transfer of Care Note  Patient: Andrea Spencer  Procedure(s) Performed: COLONOSCOPY WITH PROPOFOL  Patient Location: PACU  Anesthesia Type:General  Level of Consciousness: awake and sedated  Airway & Oxygen Therapy: Patient Spontanous Breathing and Patient connected to nasal cannula oxygen  Post-op Assessment: Report given to RN and Post -op Vital signs reviewed and stable  Post vital signs: Reviewed and stable  Last Vitals:  Vitals Value Taken Time  BP    Temp    Pulse    Resp    SpO2      Last Pain:  Vitals:   09/17/21 0805  TempSrc: Temporal  PainSc: 0-No pain         Complications: No notable events documented.

## 2021-09-17 NOTE — Anesthesia Postprocedure Evaluation (Signed)
Anesthesia Post Note  Patient: Andrea Spencer  Procedure(s) Performed: COLONOSCOPY WITH PROPOFOL  Patient location during evaluation: PACU Anesthesia Type: General Level of consciousness: awake and oriented Pain management: pain level controlled Vital Signs Assessment: post-procedure vital signs reviewed and stable Respiratory status: respiratory function stable Cardiovascular status: stable Anesthetic complications: no   No notable events documented.   Last Vitals:  Vitals:   09/17/21 0922 09/17/21 0932  BP: 101/65 109/88  Pulse: 73 68  Resp: 17 16  Temp:    SpO2: 100% 100%    Last Pain:  Vitals:   09/17/21 0932  TempSrc:   PainSc: 0-No pain                 VAN STAVEREN,Ashaun Gaughan

## 2021-09-17 NOTE — Anesthesia Procedure Notes (Signed)
Date/Time: 09/17/2021 8:36 AM Performed by: Vaughan Sine Pre-anesthesia Checklist: Patient identified, Emergency Drugs available, Suction available, Patient being monitored and Timeout performed Patient Re-evaluated:Patient Re-evaluated prior to induction Oxygen Delivery Method: Nasal cannula Preoxygenation: Pre-oxygenation with 100% oxygen Induction Type: IV induction Placement Confirmation: positive ETCO2 and CO2 detector

## 2021-09-17 NOTE — Interval H&P Note (Signed)
History and Physical Interval Note: Preprocedure H&P from 09/17/21  was reviewed and there was no interval change after seeing and examining the patient.  Written consent was obtained from the patient after discussion of risks, benefits, and alternatives. Patient has consented to proceed with Colonoscopy with possible intervention   09/17/2021 8:27 AM  Andrea Spencer  has presented today for surgery, with the diagnosis of Z12.11  - Colon cancer screening.  The various methods of treatment have been discussed with the patient and family. After consideration of risks, benefits and other options for treatment, the patient has consented to  Procedure(s): COLONOSCOPY WITH PROPOFOL (N/A) as a surgical intervention.  The patient's history has been reviewed, patient examined, no change in status, stable for surgery.  I have reviewed the patient's chart and labs.  Questions were answered to the patient's satisfaction.     [name]

## 2021-09-17 NOTE — H&P (Signed)
Pre-Procedure H&P   Patient ID: Andrea Spencer is a 76 y.o. female.  Gastroenterology Provider: [name], DO  Referring Provider: Laurine Blazer, PA PCP: McLean-Scocuzza, Nino Glow, MD  Date: 09/17/2021  HPI Ms. Andrea Spencer is a 76 y.o. female who presents today for Colonoscopy for Screening colonoscopy. Patient last underwent colonoscopy in [place] approximately 10 to 12 years ago-reportedly normal Cologuard last year was negative Has had normal bowel movements without melena hematochezia diarrhea or constipation Last reported lab values hemoglobin 13.6 platelets 261,000   Past Medical History:  Diagnosis Date   Arthritis    Basal cell carcinoma 12/10/2017   Left alar crease. Micronodular pattern   Basal cell carcinoma 12/10/2017   Left upper back. Nodular.    Basal cell carcinoma 12/10/2017   Right medial mid back. Superficial.   Basal cell carcinoma 12/10/2017   Right lateral mid back. Nodular   Basal cell carcinoma 12/24/2018   Right mid back. Nodular   Basal cell carcinoma 12/24/2018   Left anterior shoulder. Nodular   BCC (basal cell carcinoma of skin)    07/02/21 left lower eyelid mohs Dr Shelda Jakes   [facility] (basal cell carcinoma) 05/23/2021   left lower eyelid, Mohs completed 07/02/2021   Breast mass    right   Cancer ([place])    skin cancer SCC, BCC multiple    COVID-19    11/22/20   Headache    History of bronchitis    History of pneumonia    Hyperlipidemia     Past Surgical History:  Procedure Laterality Date   BREAST LUMPECTOMY Right 2016   right 2016 negative malignancy fibrocystic changes    COLONOSCOPY     DILATION AND CURETTAGE OF UTERUS     JOINT REPLACEMENT     left    MASTECTOMY PARTIAL / LUMPECTOMY     10-15 years ago   MOHS SURGERY     x2 face    MOHS SURGERY     L alar 02/04/18    RADIOACTIVE SEED GUIDED EXCISIONAL BREAST BIOPSY Right 04/06/2015   Procedure: RADIOACTIVE SEED GUIDED EXCISIONAL RIGHT  BREAST BIOPSY;  Surgeon: Stark Klein, MD;  Location: [place];  Service: General;  Laterality: Right;   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   Procedure: TOTAL HIP ARTHROPLASTY ANTERIOR APPROACH;  Surgeon: Renette Butters, MD;  Location: [place];  Service: Orthopedics;  Laterality: Left;   WISDOM TOOTH EXTRACTION      Family History No h/o GI disease or malignancy  Review of Systems  Constitutional:  Negative for activity change, appetite change, chills, diaphoresis, fatigue, fever and unexpected weight change.  HENT:  Negative for trouble swallowing and voice change.   Respiratory:  Negative for shortness of breath and wheezing.   Cardiovascular:  Negative for chest pain, palpitations and leg swelling.  Gastrointestinal:  Negative for abdominal distention, abdominal pain, anal bleeding, blood in stool, constipation, diarrhea, nausea, rectal pain and vomiting.  Musculoskeletal:  Negative for arthralgias and myalgias.  Skin:  Negative for color change and pallor.  Neurological:  Negative for dizziness, syncope and weakness.  Psychiatric/Behavioral:  Negative for confusion.   All other systems reviewed and are negative.   Medications No current facility-administered medications on file prior to encounter.   Current Outpatient Medications on File Prior to Encounter  Medication Sig Dispense Refill   atorvastatin (LIPITOR) 10 MG tablet Take 1 tablet (10 mg total) by mouth daily at 6 PM. 90 tablet 3  Acetaminophen (TYLENOL ARTHRITIS PAIN PO) Take by mouth.     Alpha Lipoic Acid-Vitamin E (LIPO-GEL PO) Take 2 capsules by mouth.     Apoaequorin (PREVAGEN EXTRA STRENGTH PO) Take by mouth.     aspirin 81 MG tablet Take 81 mg by mouth daily.     Biotin 10 MG CAPS Take 1,000 mg by mouth daily.      Calcium Carb-Cholecalciferol ([employer] CALCIUM 600 & VITAMIN D3 PO) Take by mouth 2 (two) times daily after a meal.     cholecalciferol (VITAMIN D) 1000 UNITS tablet Take 1,000 Units by mouth daily.      fluorouracil (EFUDEX) 5 % cream Apply topically 2 (two) times daily. Apply twice daily for 4 days, may repeat in 1 month. 15 g 0   ketoconazole (NIZORAL) 2 % shampoo apply three times per week, massage into scalp and leave in for 10 minutes before rinsing out 120 mL 11   Krill Oil 1000 MG CAPS Take 3 capsules by mouth daily.      Multiple Vitamin (MULTIVITAMIN WITH MINERALS) TABS tablet Take 1 tablet by mouth daily.     scopolamine (TRANSDERM-SCOP) 1 MG/3DAYS Place 1 patch (1.5 mg total) onto the skin every 3 (three) days. Motion sickness 3 patch 0   triamcinolone ointment (KENALOG) 0.1 % Apply twice daily to affected areas up to 2 weeks as needed for itch. Avoid applying to face, groin, and axilla. Use as directed. Risk of skin atrophy with long-term use reviewed. 60 g 2   TURMERIC PO Take by mouth daily.     vitamin C (ASCORBIC ACID) 250 MG tablet Take 500 mg by mouth daily. Taking two tablets daily      Pertinent medications related to GI and procedure were reviewed by me with the patient prior to the procedure   Current Facility-Administered Medications:    0.9 %  sodium chloride infusion, , Intravenous, Continuous, [name], DO, Last Rate: 20 mL/hr at 09/17/21 0804, New Bag at 09/17/21 0804      Allergies  Allergen Reactions   Aspartame And Phenylalanine    Meloxicam Hives and Diarrhea   Other Itching    Sunscreens Lotions and Tanning products   Paxlovid [Nirmatrelvir-Ritonavir] Diarrhea   Allergies were reviewed by me prior to the procedure  Objective   Body mass index is 22.48 kg/m. Vitals:   09/17/21 0805  BP: 138/68  Pulse: 83  Resp: 16  Temp: (!) 97.3 F (36.3 C)  TempSrc: Temporal  SpO2: 99%  Weight: 54 kg  Height: '5\' 1"'$  (1.549 m)     Physical Exam Vitals and nursing note reviewed.  Constitutional:      General: She is not in acute distress.    Appearance: Normal appearance. She is not ill-appearing, toxic-appearing or diaphoretic.  HENT:      Head: Normocephalic and atraumatic.     Nose: Nose normal.     Mouth/Throat:     Mouth: Mucous membranes are moist.     Pharynx: Oropharynx is clear.  Eyes:     General: No scleral icterus.    Extraocular Movements: Extraocular movements intact.  Cardiovascular:     Rate and Rhythm: Normal rate and regular rhythm.     Heart sounds: Normal heart sounds. No murmur heard.   No friction rub. No gallop.  Pulmonary:     Effort: Pulmonary effort is normal. No respiratory distress.     Breath sounds: Normal breath sounds. No wheezing, rhonchi or rales.  Abdominal:  General: Bowel sounds are normal. There is no distension.     Palpations: Abdomen is soft.     Tenderness: There is no abdominal tenderness. There is no guarding or rebound.  Musculoskeletal:     Cervical back: Neck supple.     Right lower leg: No edema.     Left lower leg: No edema.  Skin:    General: Skin is warm and dry.     Coloration: Skin is not jaundiced or pale.  Neurological:     General: No focal deficit present.     Mental Status: She is alert and oriented to person, place, and time. Mental status is at baseline.  Psychiatric:        Mood and Affect: Mood normal.        Behavior: Behavior normal.        Thought Content: Thought content normal.        Judgment: Judgment normal.     Assessment:  Ms. Andrea Spencer is a 76 y.o. female  who presents today for Colonoscopy for Screening colonoscopy.  Plan:  Colonoscopy with possible intervention today  Colonoscopy with possible biopsy, control of bleeding, polypectomy, and interventions as necessary has been discussed with the patient/patient representative. Informed consent was obtained from the patient/patient representative after explaining the indication, nature, and risks of the procedure including but not limited to death, bleeding, perforation, missed neoplasm/lesions, cardiorespiratory compromise, and reaction to medications. Opportunity for  questions was given and appropriate answers were provided. Patient/patient representative has verbalized understanding is amenable to undergoing the procedure.   [name], DO  [facility] Gastroenterology  Portions of the record may have been created with voice recognition software. Occasional wrong-word or 'sound-a-like' substitutions may have occurred due to the inherent limitations of voice recognition software.  Read the chart carefully and recognize, using context, where substitutions may have occurred.

## 2021-09-18 ENCOUNTER — Encounter: Payer: Self-pay | Admitting: Gastroenterology

## 2021-09-18 LAB — SURGICAL PATHOLOGY

## 2021-09-22 ENCOUNTER — Other Ambulatory Visit: Payer: Self-pay | Admitting: Dermatology

## 2021-09-22 DIAGNOSIS — L219 Seborrheic dermatitis, unspecified: Secondary | ICD-10-CM

## 2021-10-02 ENCOUNTER — Other Ambulatory Visit (INDEPENDENT_AMBULATORY_CARE_PROVIDER_SITE_OTHER): Payer: PPO

## 2021-10-02 DIAGNOSIS — S60452A Superficial foreign body of right middle finger, initial encounter: Secondary | ICD-10-CM

## 2021-10-02 DIAGNOSIS — E785 Hyperlipidemia, unspecified: Secondary | ICD-10-CM

## 2021-10-02 LAB — COMPREHENSIVE METABOLIC PANEL
ALT: 18 U/L (ref 0–35)
AST: 20 U/L (ref 0–37)
Albumin: 3.9 g/dL (ref 3.5–5.2)
Alkaline Phosphatase: 41 U/L (ref 39–117)
BUN: 9 mg/dL (ref 6–23)
CO2: 31 mEq/L (ref 19–32)
Calcium: 9.9 mg/dL (ref 8.4–10.5)
Chloride: 103 mEq/L (ref 96–112)
Creatinine, Ser: 0.62 mg/dL (ref 0.40–1.20)
GFR: 88.24 mL/min (ref >60.00)
Glucose, Bld: 89 mg/dL (ref 70–99)
Potassium: 3.9 mEq/L (ref 3.5–5.1)
Sodium: 141 mEq/L (ref 135–145)
Total Bilirubin: 1.6 mg/dL — ABNORMAL HIGH (ref 0.2–1.2)
Total Protein: 6.6 g/dL (ref 6.0–8.3)

## 2021-10-02 LAB — CBC WITH DIFFERENTIAL/PLATELET
Basophils Absolute: 0.1 10*3/uL (ref 0.0–0.1)
Basophils Relative: 0.9 % (ref 0.0–3.0)
Eosinophils Absolute: 0.3 10*3/uL (ref 0.0–0.7)
Eosinophils Relative: 4.2 % (ref 0.0–5.0)
HCT: 39.4 % (ref 36.0–46.0)
Hemoglobin: 13.3 g/dL (ref 12.0–15.0)
Lymphocytes Relative: 25.3 % (ref 12.0–46.0)
Lymphs Abs: 1.6 10*3/uL (ref 0.7–4.0)
MCHC: 33.8 g/dL (ref 30.0–36.0)
MCV: 91.2 fl (ref 78.0–100.0)
Monocytes Absolute: 0.6 10*3/uL (ref 0.1–1.0)
Monocytes Relative: 8.8 % (ref 3.0–12.0)
Neutro Abs: 3.8 10*3/uL (ref 1.4–7.7)
Neutrophils Relative %: 60.8 % (ref 43.0–77.0)
Platelets: 264 10*3/uL (ref 150.0–400.0)
RBC: 4.33 Mil/uL (ref 3.87–5.11)
RDW: 12.1 % (ref 11.5–15.5)
WBC: 6.3 10*3/uL (ref 4.0–10.5)

## 2021-10-02 LAB — LIPID PANEL
Cholesterol: 148 mg/dL (ref 0–200)
HDL: 66.6 mg/dL (ref >39.00)
LDL Cholesterol: 59 mg/dL (ref 0–99)
NonHDL: 81.89
Total CHOL/HDL Ratio: 2
Triglycerides: 112 mg/dL (ref 0.0–149.0)
VLDL: 22.4 mg/dL (ref 0.0–40.0)

## 2021-10-04 ENCOUNTER — Encounter: Payer: Self-pay | Admitting: Internal Medicine

## 2021-10-04 ENCOUNTER — Ambulatory Visit: Payer: PPO

## 2021-10-04 ENCOUNTER — Ambulatory Visit (INDEPENDENT_AMBULATORY_CARE_PROVIDER_SITE_OTHER): Payer: PPO | Admitting: Internal Medicine

## 2021-10-04 ENCOUNTER — Ambulatory Visit (INDEPENDENT_AMBULATORY_CARE_PROVIDER_SITE_OTHER): Payer: PPO

## 2021-10-04 VITALS — BP 124/70 | HR 68 | Temp 98.3°F | Resp 14 | Ht 61.0 in | Wt 124.4 lb

## 2021-10-04 DIAGNOSIS — M419 Scoliosis, unspecified: Secondary | ICD-10-CM | POA: Diagnosis not present

## 2021-10-04 DIAGNOSIS — M25552 Pain in left hip: Secondary | ICD-10-CM | POA: Diagnosis not present

## 2021-10-04 DIAGNOSIS — H6993 Unspecified Eustachian tube disorder, bilateral: Secondary | ICD-10-CM

## 2021-10-04 DIAGNOSIS — M25562 Pain in left knee: Secondary | ICD-10-CM | POA: Diagnosis not present

## 2021-10-04 DIAGNOSIS — M5136 Other intervertebral disc degeneration, lumbar region: Secondary | ICD-10-CM

## 2021-10-04 DIAGNOSIS — H9313 Tinnitus, bilateral: Secondary | ICD-10-CM

## 2021-10-04 DIAGNOSIS — G8929 Other chronic pain: Secondary | ICD-10-CM | POA: Diagnosis not present

## 2021-10-04 DIAGNOSIS — Z1231 Encounter for screening mammogram for malignant neoplasm of breast: Secondary | ICD-10-CM

## 2021-10-04 DIAGNOSIS — M545 Low back pain, unspecified: Secondary | ICD-10-CM | POA: Diagnosis not present

## 2021-10-04 DIAGNOSIS — M4126 Other idiopathic scoliosis, lumbar region: Secondary | ICD-10-CM | POA: Diagnosis not present

## 2021-10-04 MED ORDER — LORATADINE 10 MG PO TABS: 10.0000 mg | ORAL_TABLET | Freq: Every evening | ORAL | 3 refills | Status: AC | PRN

## 2021-10-04 MED ORDER — SALINE SPRAY 0.65 % NA SOLN: 2.0000 | Freq: Every day | NASAL | 11 refills | Status: AC | PRN

## 2021-10-04 MED ORDER — FLUTICASONE PROPIONATE 50 MCG/ACT NA SUSP: 2.0000 | Freq: Every day | NASAL | 11 refills | Status: DC

## 2021-10-04 NOTE — Patient Instructions (Addendum)
Allergy pills at night claritin or allegra or zyrtec or xyzal  Nasal saline in nose 2 sprays followed by flonase as needed 1-2 sprays   [facility] place  [address], [place], [place] [address] Hours:  Open ? Closes 5?PM Phone: [phone]  Dr. Fredonia Highland  Phone Fax E-mail Address  [phone] Not available [address]   [place] [address]     Specialties     Orthopedic Surgery            Dr. Redmond Baseman [facility]  Phone Fax E-mail Address  [phone] Not available [address]   [address]   [place] [address]     Specialties     Otolaryngology              Sound machine   Tinnitus Tinnitus refers to hearing a sound when there is no actual source for that sound. This is often described as ringing in the ears. However, people with this condition may hear a variety of noises, in one ear or in both ears. The sounds of tinnitus can be soft, loud, or somewhere in between. Tinnitus can last for a few seconds or can be constant for days. It may go away without treatment and come back at various times. When tinnitus is constant or happens often, it can lead to other problems, such as trouble sleeping and trouble concentrating. Almost everyone experiences tinnitus at some point. Tinnitus is not the same as hearing loss. Tinnitus that is long-lasting (chronic) or comes back often (recurs) may require medical attention. What are the causes? The cause of tinnitus is often not known. In some cases, it can result from: Exposure to loud noises from machinery, music, or other sources. An object (foreign body) stuck in the ear. Earwax buildup. Drinking alcohol or caffeine. Taking certain medicines. Age-related hearing loss. It may also be caused by medical conditions such as: Ear or sinus infections. Heart diseases or high blood pressure. Allergies. Mnire's disease. Thyroid problems. Tumors. A weak, bulging blood vessel  (aneurysm) near the ear. What increases the risk? The following factors may make you more likely to develop this condition: Exposure to loud noises. Age. Tinnitus is more likely in older individuals. Using alcohol or tobacco. What are the signs or symptoms? The main symptom of tinnitus is hearing a sound when there is no source for that sound. It may sound like: Buzzing. Sizzling. Ringing. Blowing air. Hissing. Whistling. Other sounds may include: Roaring. Running water. A musical note. Tapping. Humming. Symptoms may affect only one ear (unilateral) or both ears (bilateral). How is this diagnosed? Tinnitus is diagnosed based on your symptoms, your medical history, and a physical exam. Your health care provider may do a thorough hearing test (audiologic exam) if your tinnitus: Is unilateral. Causes hearing difficulties. Lasts 6 months or longer. You may work with a health care provider who specializes in hearing disorders (audiologist). You may be asked questions about your symptoms and how they affect your daily life. You may have other tests done, such as: CT scan. MRI. An imaging test of how blood flows through your blood vessels (angiogram). How is this treated? Treating an underlying medical condition can sometimes make tinnitus go away. If your tinnitus continues, other treatments may include: Therapy and counseling to help you manage the stress of living with tinnitus. Sound generators to mask the tinnitus. These include: Tabletop sound machines that play relaxing sounds to help you  fall asleep. Wearable devices that fit in your ear and play sounds or music. Acoustic neural stimulation. This involves using headphones to listen to music that contains an auditory signal. Over time, listening to this signal may change some pathways in your brain and make you less sensitive to tinnitus. This treatment is used for very severe cases when no other treatment is working. Using  hearing aids or cochlear implants if your tinnitus is related to hearing loss. Hearing aids are worn in the outer ear. Cochlear implants are surgically placed in the inner ear. Follow these instructions at home: Managing symptoms     When possible, avoid being in loud places and being exposed to loud sounds. Wear hearing protection, such as earplugs, when you are exposed to loud noises. Use a white noise machine, a humidifier, or other devices to mask the sound of tinnitus. Practice techniques for reducing stress, such as meditation, yoga, or deep breathing. Work with your health care provider if you need help with managing stress. Sleep with your head slightly raised. This may reduce the impact of tinnitus. General instructions Do not use stimulants, such as nicotine, alcohol, or caffeine. Talk with your health care provider about other stimulants to avoid. Stimulants are substances that can make you feel alert and attentive by increasing certain activities in the body (such as heart rate and blood pressure). These substances may make tinnitus worse. Take over-the-counter and prescription medicines only as told by your health care provider. Try to get plenty of sleep each night. Keep all follow-up visits. This is important. Contact a health care provider if: Your tinnitus continues for 3 weeks or longer without stopping. You develop sudden hearing loss. Your symptoms get worse or do not get better with home care. You feel you are not able to manage the stress of living with tinnitus. Get help right away if: You develop tinnitus after a head injury. You have tinnitus along with any of the following: Dizziness. Nausea and vomiting. Loss of balance. Sudden, severe headache. Vision changes. Facial weakness or weakness of arms or legs. These symptoms may represent a serious problem that is an emergency. Do not wait to see if the symptoms will go away. Get medical help right away. Call your  local emergency services (911 in the [place] Do not drive yourself to the hospital. Summary Tinnitus refers to hearing a sound when there is no actual source for that sound. This is often described as ringing in the ears. Symptoms may affect only one ear (unilateral) or both ears (bilateral). Use a white noise machine, a humidifier, or other devices to mask the sound of tinnitus. Do not use stimulants, such as nicotine, alcohol, or caffeine. These substances may make tinnitus worse. This information is not intended to replace advice given to you by your health care provider. Make sure you discuss any questions you have with your health care provider. Document Revised: 03/20/2020 Document Reviewed: 03/20/2020 Elsevier Patient Education  [place].

## 2021-10-04 NOTE — Progress Notes (Addendum)
Chief Complaint  Patient presents with   Follow-up    6 mon, disc about tendonitis, and issue with her L leg, she fell 2 wks ago at the gym, she didn't touch the floor but ever since she has not been able to lift L leg up at times. Denies any pain currently.   6 month f/u  1. Tinnitis no hearing loss tried claritin and helped and tried otc pills which are not helping refer to ent  2. Fall 2 week ago at the gym due to left leg giving out and unable to bend at the knee/hip. At times having lower back pain and hip pain left groin pain s/p total left hip in 2017  At times cant lift leg and hard to get out of the car  -->given handicap placard today  IMPRESSION: 1. Mild free edge blunting in the mid body region of the lateral meniscus probably due to small free edge tears. 2. Intact ligamentous structures and no acute bony findings. 3. No joint effusion or Baker's cyst. 4. Mild proximal and distal patellar tendinopathy. 5. Mild tricompartmental chondromalacia of but no cartilage defects or significant degenerative change.     Electronically Signed   By: Marijo Sanes M.D.   On: 07/07/2014 08:44  EXAM: LUMBAR SPINE - COMPLETE 4+ VIEW   COMPARISON:  None.   FINDINGS: There is no evidence of an acute lumbar spine fracture. There is marked severity levoscoliosis. Moderate severity multilevel endplate sclerosis is seen. This is most prominent at the levels of L2-L3 and L3-L4. Mild to moderate severity multilevel intervertebral disc space narrowing is noted. A total left hip replacement is seen without evidence of surrounding lucency to suggest the presence of hardware loosening or infection. Radiopaque surgical clips are seen within the right upper quadrant.   IMPRESSION: 1. Marked severity levoscoliosis with moderate severity multilevel degenerative disc disease. 2. Total left hip replacement without evidence of hardware loosening or infection.   Review of Systems   Constitutional:  Negative for weight loss.  HENT:  Negative for hearing loss.   Eyes:  Negative for blurred vision.  Respiratory:  Negative for shortness of breath.   Cardiovascular:  Negative for chest pain.  Gastrointestinal:  Negative for abdominal pain and blood in stool.  Genitourinary:  Negative for dysuria.  Musculoskeletal:  Negative for falls and joint pain.  Skin:  Negative for rash.  Neurological:  Negative for headaches.  Psychiatric/Behavioral:  Negative for depression.    Past Medical History:  Diagnosis Date   Arthritis    Basal cell carcinoma 12/10/2017   Left alar crease. Micronodular pattern   Basal cell carcinoma 12/10/2017   Left upper back. Nodular.    Basal cell carcinoma 12/10/2017   Right medial mid back. Superficial.   Basal cell carcinoma 12/10/2017   Right lateral mid back. Nodular   Basal cell carcinoma 12/24/2018   Right mid back. Nodular   Basal cell carcinoma 12/24/2018   Left anterior shoulder. Nodular   BCC (basal cell carcinoma of skin)    07/02/21 left lower eyelid mohs Dr Shelda Jakes   [facility] (basal cell carcinoma) 05/23/2021   left lower eyelid, Mohs completed 07/02/2021   Breast mass    right   Cancer ([place])    skin cancer SCC, BCC multiple    COVID-19    11/22/20   Headache    History of bronchitis    History of pneumonia    Hyperlipidemia    Past Surgical History:  Procedure Laterality  Date   BREAST LUMPECTOMY Right 2016   right 2016 negative malignancy fibrocystic changes    COLONOSCOPY     COLONOSCOPY WITH PROPOFOL N/A 09/17/2021   Procedure: COLONOSCOPY WITH PROPOFOL;  Surgeon: [name], DO;  Location: [facility] ENDOSCOPY;  Service: Gastroenterology;  Laterality: N/A;   DILATION AND CURETTAGE OF UTERUS     JOINT REPLACEMENT     left    MASTECTOMY PARTIAL / LUMPECTOMY     10-15 years ago   MOHS SURGERY     x2 face    MOHS SURGERY     L alar 02/04/18    RADIOACTIVE SEED GUIDED EXCISIONAL BREAST BIOPSY Right 04/06/2015    Procedure: RADIOACTIVE SEED GUIDED EXCISIONAL RIGHT BREAST BIOPSY;  Surgeon: Stark Klein, MD;  Location: [facility];  Service: General;  Laterality: Right;   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   Procedure: TOTAL HIP ARTHROPLASTY ANTERIOR APPROACH;  Surgeon: Renette Butters, MD;  Location: [facility];  Service: Orthopedics;  Laterality: Left;   WISDOM TOOTH EXTRACTION     Family History  Problem Relation Age of Onset   Stroke Mother    Alzheimer's disease Father    Social History   Socioeconomic History   Marital status: Widowed    Spouse name: Not on file   Number of children: Not on file   Years of education: Not on file   Highest education level: Not on file  Occupational History   Not on file  Tobacco Use   Smoking status: Never   Smokeless tobacco: Never  Vaping Use   Vaping Use: Never used  Substance and Sexual Activity   Alcohol use: Yes    Comment: 2-3 drinks/week,none last 24rs   Drug use: No   Sexual activity: Not Currently  Other Topics Concern   Not on file  Social History Narrative   Widowed husband died 11-09-2018    1 son lives in [place]; also has step kids (son and daughter though deceased husband [place])   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    Owns guns, wear seat belt    Safe in relationship    1 dog Viszla named Biochemist, clinical   Social Determinants of Health   Financial Resource Strain: Low Risk  (06/28/2021)   Overall Financial Resource Strain (CARDIA)    Difficulty of Paying Living Expenses: Not hard at all  Food Insecurity: No Food Insecurity (06/28/2021)   Hunger Vital Sign    Worried About Running Out of Food in the Last Year: Never true    [place] in the Last Year: Never true  Transportation Needs: No Transportation Needs (06/28/2021)   PRAPARE - Hydrologist (Medical): No    Lack of Transportation (Non-Medical): No  Physical Activity: Sufficiently Active (06/28/2021)   Exercise Vital Sign    Days of Exercise per Week: 5  days    Minutes of Exercise per Session: 60 min  Stress: No Stress Concern Present (06/28/2021)   [place]    Feeling of Stress : Not at all  Social Connections: Unknown (06/28/2021)   Social Connection and Isolation Panel [NHANES]    Frequency of Communication with Friends and Family: More than three times a week    Frequency of Social Gatherings with Friends and Family: More than three times a week    Attends Religious Services: Not on Diplomatic Services operational officer of Clubs or Organizations:  Not on file    Attends Club or Organization Meetings: Not on file    Marital Status: Not on file  Intimate Partner Violence: Not At Risk (06/28/2021)   Humiliation, Afraid, Rape, and Kick questionnaire    Fear of Current or Ex-Partner: No    Emotionally Abused: No    Physically Abused: No    Sexually Abused: No   Current Meds  Medication Sig   Acetaminophen (TYLENOL ARTHRITIS PAIN PO) Take by mouth.   Apoaequorin (PREVAGEN EXTRA STRENGTH PO) Take by mouth.   aspirin 81 MG tablet Take 81 mg by mouth daily.   atorvastatin (LIPITOR) 10 MG tablet Take 1 tablet (10 mg total) by mouth daily at 6 PM.   Biotin 10 MG CAPS Take 1,000 mg by mouth daily.    cholecalciferol (VITAMIN D) 1000 UNITS tablet Take 1,000 Units by mouth daily.   fluticasone (FLONASE) 50 MCG/ACT nasal spray Place 2 sprays into both nostrils daily.   ketoconazole (NIZORAL) 2 % shampoo APPLY THREE TIMES PER WEEK, MASSAGE INTO SCALP AND LEAVE IN FOR 10 MINUTES BEFORE RINSING OUT   loratadine (CLARITIN) 10 MG tablet Take 1 tablet (10 mg total) by mouth at bedtime as needed for allergies.   Multiple Vitamin (MULTIVITAMIN WITH MINERALS) TABS tablet Take 1 tablet by mouth daily.   scopolamine (TRANSDERM-SCOP) 1 MG/3DAYS Place 1 patch (1.5 mg total) onto the skin every 3 (three) days. Motion sickness   sodium chloride (OCEAN) 0.65 % SOLN nasal spray Place 2 sprays into both nostrils  daily as needed for congestion.   triamcinolone ointment (KENALOG) 0.1 % Apply twice daily to affected areas up to 2 weeks as needed for itch. Avoid applying to face, groin, and axilla. Use as directed. Risk of skin atrophy with long-term use reviewed.   TURMERIC PO Take by mouth daily.   vitamin C (ASCORBIC ACID) 250 MG tablet Take 500 mg by mouth daily. Taking two tablets daily   Allergies  Allergen Reactions   Aspartame And Phenylalanine    Meloxicam Hives and Diarrhea   Other Itching    Sunscreens Lotions and Tanning products   Paxlovid [Nirmatrelvir-Ritonavir] Diarrhea   Recent Results (from the past 2160 hour(s))  Surgical pathology     Status: None   Collection Time: 09/17/21  8:59 AM  Result Value Ref Range   SURGICAL PATHOLOGY      SURGICAL PATHOLOGY CASE: [id number] PATIENT: Mirian Capuchin Surgical Pathology Report     Specimen Submitted: A. Colon polyp, descending; cold snare B. Rectum polyp; cbx  Clinical History: Z12.11 - colon cancer screening.  Colon polyps, diverticulosis    DIAGNOSIS: A. COLON POLYP, DESCENDING; COLD SNARE: - TUBULAR ADENOMA. - NEGATIVE FOR HIGH-GRADE DYSPLASIA AND MALIGNANCY.  B. RECTAL POLYP; COLD BIOPSY: - MINUTE SUPERFICIAL FRAGMENTS OF BENIGN RECTAL MUCOSA WITH NO SIGNIFICANT HISTOPATHOLOGIC CHANGE. - NEGATIVE FOR DYSPLASIA AND MALIGNANCY.  GROSS DESCRIPTION: A. Labeled: Cold snare polyp descending colon Received: Formalin Collection time: 8:59 AM on 09/17/2021 Placed into formalin time: 8:59 AM on 09/17/2021 Tissue fragment(s): Multiple Size: Aggregate, 1.6 x 0.4 x 0.1 cm Description: Tan soft tissue fragments Entirely submitted in 1 cassette.  B. Labeled: Cbx polyp rectum Received: Formalin Collection time: 9:07 AM on 09/17/2021 Placed into  formalin time: 9:07 AM on 09/17/2021 Tissue fragment(s): None Description: Tissue fragments are not grossly identified in the specimen container.  The formalin is filtered and  entirely submitted in a mesh biopsy bag Entirely submitted in 1 cassette.  CM 09/17/2021  Final  Diagnosis performed by Allena Napoleon, MD.   Electronically signed 09/18/2021 8:47:22AM The electronic signature indicates that the named Attending Pathologist has evaluated the specimen Technical component performed at [facility], [address] [place], [place] [address] Lab: [phone] Dir: Rush Farmer, MD, MMM  Professional component performed at [facility], [facility], [place], [place], [place] [address] Lab: [phone] Dir: Kathi Simpers, MD   CBC with Differential/Platelet     Status: None   Collection Time: 10/02/21  7:32 AM  Result Value Ref Range   WBC 6.3 4.0 - 10.5 K/uL   RBC 4.33 3.87 - 5.11 Mil/uL   Hemoglobin 13.3 12.0 - 15.0 g/dL   HCT 39.4 36.0 - 46.0 %   MCV 91.2 78.0 - 100.0 fl   MCHC 33.8 30.0 - 36.0 g/dL   RDW 12.1 11.5 - 15.5 %   Platelets 264.0 150.0 - 400.0 K/uL   Neutrophils Relative % 60.8 43.0 - 77.0 %   Lymphocytes Relative 25.3 12.0 - 46.0 %   Monocytes Relative 8.8 3.0 - 12.0 %   Eosinophils Relative 4.2 0.0 - 5.0 %   Basophils Relative 0.9 0.0 - 3.0 %   Neutro Abs 3.8 1.4 - 7.7 K/uL   Lymphs Abs 1.6 0.7 - 4.0 K/uL   Monocytes Absolute 0.6 0.1 - 1.0 K/uL   Eosinophils Absolute 0.3 0.0 - 0.7 K/uL   Basophils Absolute 0.1 0.0 - 0.1 K/uL  Lipid panel     Status: None   Collection Time: 10/02/21  7:32 AM  Result Value Ref Range   Cholesterol 148 0 - 200 mg/dL    Comment: ATP III Classification       Desirable:  < 200 mg/dL               Borderline High:  200 - 239 mg/dL          High:  > = 240 mg/dL   Triglycerides 112.0 0.0 - 149.0 mg/dL    Comment: Normal:  <150 mg/dLBorderline High:  150 - 199 mg/dL   HDL 66.60 >39.00 mg/dL   VLDL 22.4 0.0 - 40.0 mg/dL   LDL Cholesterol 59 0 - 99 mg/dL   Total CHOL/HDL Ratio 2     Comment:                Men          Women1/2 Average Risk     3.4          3.3Average Risk          5.0           4.42X Average Risk          9.6          7.13X Average Risk          15.0          11.0                       NonHDL 81.89     Comment: NOTE:  Non-HDL goal should be 30 mg/dL higher than patient's LDL goal (i.e. LDL goal of < 70 mg/dL, would have non-HDL goal of < 100 mg/dL)  Comprehensive metabolic panel     Status: Abnormal   Collection Time: 10/02/21  7:32 AM  Result Value Ref Range   Sodium 141 135 - 145 mEq/L   Potassium 3.9 3.5 - 5.1 mEq/L   Chloride 103 96 - 112 mEq/L  CO2 31 19 - 32 mEq/L   Glucose, Bld 89 70 - 99 mg/dL   BUN 9 6 - 23 mg/dL   Creatinine, Ser 0.62 0.40 - 1.20 mg/dL   Total Bilirubin 1.6 (H) 0.2 - 1.2 mg/dL   Alkaline Phosphatase 41 39 - 117 U/L   AST 20 0 - 37 U/L   ALT 18 0 - 35 U/L   Total Protein 6.6 6.0 - 8.3 g/dL   Albumin 3.9 3.5 - 5.2 g/dL   GFR 88.24 >60.00 mL/min    Comment: Calculated using the CKD-EPI Creatinine Equation (2021)   Calcium 9.9 8.4 - 10.5 mg/dL   Objective  Body mass index is 23.51 kg/m. Wt Readings from Last 3 Encounters:  10/04/21 124 lb 6.4 oz (56.4 kg)  09/17/21 119 lb (54 kg)  06/28/21 122 lb (55.3 kg)   Temp Readings from Last 3 Encounters:  10/04/21 98.3 F (36.8 C) (Oral)  09/17/21 (!) 96.5 F (35.8 C) (Temporal)  04/03/21 98.9 F (37.2 C) (Oral)   BP Readings from Last 3 Encounters:  10/04/21 124/70  09/17/21 109/88  04/03/21 110/70   Pulse Readings from Last 3 Encounters:  10/04/21 68  09/17/21 68  04/03/21 70    Physical Exam Vitals and nursing note reviewed.  Constitutional:      Appearance: Normal appearance. She is well-developed and well-groomed.  HENT:     Head: Normocephalic and atraumatic.  Eyes:     Conjunctiva/sclera: Conjunctivae normal.     Pupils: Pupils are equal, round, and reactive to light.  Cardiovascular:     Rate and Rhythm: Normal rate and regular rhythm.     Heart sounds: Normal heart sounds. No murmur heard. Pulmonary:     Effort: Pulmonary effort is normal.     Breath  sounds: Normal breath sounds.  Abdominal:     General: Abdomen is flat. Bowel sounds are normal.     Tenderness: There is no abdominal tenderness.  Musculoskeletal:        General: No tenderness.  Skin:    General: Skin is warm and dry.  Neurological:     General: No focal deficit present.     Mental Status: She is alert and oriented to person, place, and time. Mental status is at baseline.     Cranial Nerves: Cranial nerves 2-12 are intact.     Motor: Motor function is intact.     Coordination: Coordination is intact.     Gait: Gait is intact.  Psychiatric:        Attention and Perception: Attention and perception normal.        Mood and Affect: Mood and affect normal.        Speech: Speech normal.        Behavior: Behavior normal. Behavior is cooperative.        Thought Content: Thought content normal.        Cognition and Memory: Cognition and memory normal.        Judgment: Judgment normal.     Assessment  Plan  Left hip pain s/p left total hip - Plan: DG Hip Unilat W OR W/O Pelvis 2-3 Views Left, Ambulatory referral to Orthopedic Surgery, Ambulatory referral to Physical Therapy,   DDD (degenerative disc disease), lumbar - Plan: DG Lumbar Spine Complete, Ambulatory referral to Orthopedic Surgery, Ambulatory referral to Physical Therapy,  Handicap form given today   Scoliosis of lumbar spine, unspecified scoliosis type - Plan: DG Lumbar Spine Complete, Ambulatory referral to Orthopedic  Surgery, Ambulatory referral to Physical Therapy  Chronic pain of left knee - Plan: DG Knee Complete 4 Views left Ambulatory referral to Orthopedic Surgery, Ambulatory referral to Physical Therapy  Tinnitus of both ears - Plan: Ambulatory referral to ENT Disorder of both eustachian tubes - Plan: sodium chloride (OCEAN) 0.65 % SOLN nasal spray, fluticasone (FLONASE) 50 MCG/ACT nasal spray, loratadine (CLARITIN) 10 MG tablet   HM Had flu shot utd shingrix 2/2  utd Tdap  utd prevnar and pna  25  covid 5/5   Out of age window pap  07/31/20 neg mammo , 08/02/21 negative DEXA 05/28/2018 osteopenia repeat in 2023  cologuard 06/08/20 negative ordered gi referral [facility] [place]21 [facility] gi Dr. Virgina Jock colonoscopy  tubular adenoma Dr. Virgina Jock f/u in 7 years rectal polyp neg f/u 7 years due to beyong age 26 and neg prev and no FH rec shared decision per pt and PCP if will repeat in 7 years H/o skin SCC, BCC multiple f/u Dr. Laurence Ferrari last seen fall 2020  mohs 3rd to nose 01/2018 Dr. Elvera Bicker [facility]    [facility] left alar nose mohs on 02/04/18 Dr. Manley Mason, derm f/u 05/2018  appt derm 01/2020 f/u Q6 months    Cont work out ymca 5x per week    Provider: Dr. Olivia Mackie McLean-Scocuzza-Internal Medicine

## 2021-10-17 DIAGNOSIS — M25562 Pain in left knee: Secondary | ICD-10-CM | POA: Diagnosis not present

## 2021-10-17 DIAGNOSIS — M25552 Pain in left hip: Secondary | ICD-10-CM | POA: Diagnosis not present

## 2021-10-17 DIAGNOSIS — M545 Low back pain, unspecified: Secondary | ICD-10-CM | POA: Diagnosis not present

## 2021-10-23 DIAGNOSIS — M5116 Intervertebral disc disorders with radiculopathy, lumbar region: Secondary | ICD-10-CM | POA: Diagnosis not present

## 2021-11-01 DIAGNOSIS — M5116 Intervertebral disc disorders with radiculopathy, lumbar region: Secondary | ICD-10-CM | POA: Diagnosis not present

## 2021-11-06 DIAGNOSIS — M5116 Intervertebral disc disorders with radiculopathy, lumbar region: Secondary | ICD-10-CM | POA: Diagnosis not present

## 2021-11-08 DIAGNOSIS — M5116 Intervertebral disc disorders with radiculopathy, lumbar region: Secondary | ICD-10-CM | POA: Diagnosis not present

## 2021-11-13 DIAGNOSIS — M5116 Intervertebral disc disorders with radiculopathy, lumbar region: Secondary | ICD-10-CM | POA: Diagnosis not present

## 2021-11-15 DIAGNOSIS — M5116 Intervertebral disc disorders with radiculopathy, lumbar region: Secondary | ICD-10-CM | POA: Diagnosis not present

## 2021-11-28 DIAGNOSIS — M25552 Pain in left hip: Secondary | ICD-10-CM | POA: Diagnosis not present

## 2021-11-29 ENCOUNTER — Ambulatory Visit: Payer: PPO | Admitting: Dermatology

## 2021-11-29 DIAGNOSIS — L814 Other melanin hyperpigmentation: Secondary | ICD-10-CM | POA: Diagnosis not present

## 2021-11-29 DIAGNOSIS — D229 Melanocytic nevi, unspecified: Secondary | ICD-10-CM

## 2021-11-29 DIAGNOSIS — L821 Other seborrheic keratosis: Secondary | ICD-10-CM

## 2021-11-29 DIAGNOSIS — D225 Melanocytic nevi of trunk: Secondary | ICD-10-CM

## 2021-11-29 DIAGNOSIS — L820 Inflamed seborrheic keratosis: Secondary | ICD-10-CM

## 2021-11-29 DIAGNOSIS — D1800 Hemangioma unspecified site: Secondary | ICD-10-CM | POA: Diagnosis not present

## 2021-11-29 DIAGNOSIS — Z85828 Personal history of other malignant neoplasm of skin: Secondary | ICD-10-CM | POA: Diagnosis not present

## 2021-11-29 DIAGNOSIS — L578 Other skin changes due to chronic exposure to nonionizing radiation: Secondary | ICD-10-CM

## 2021-11-29 DIAGNOSIS — D492 Neoplasm of unspecified behavior of bone, soft tissue, and skin: Secondary | ICD-10-CM

## 2021-11-29 DIAGNOSIS — Z1283 Encounter for screening for malignant neoplasm of skin: Secondary | ICD-10-CM

## 2021-11-29 NOTE — Progress Notes (Signed)
Follow-Up Visit   Subjective  Andrea Spencer is a 76 y.o. female who presents for the following: Annual Exam (The patient presents for Total-Body Skin Exam (TBSE) for skin cancer screening and mole check.  The patient has spots, moles and lesions to be evaluated, some may be new or changing and the patient has concerns that these could be cancer. Patient has hx of BCC. ).  Patient has used 5FU/calcipotriene at right cheek several times. She does have a spot at left cheek she would like checked.   The following portions of the chart were reviewed this encounter and updated as appropriate:   Tobacco  Allergies  Meds  Problems  Med Hx  Surg Hx  Fam Hx      Review of Systems:  No other skin or systemic complaints except as noted in HPI or Assessment and Plan.  Objective  Well appearing patient in no apparent distress; mood and affect are within normal limits.  A full examination was performed including scalp, head, eyes, ears, nose, lips, neck, chest, axillae, abdomen, back, buttocks, bilateral upper extremities, bilateral lower extremities, hands, feet, fingers, toes, fingernails, and toenails. All findings within normal limits unless otherwise noted below.  Left Lower Back 0.5 cm darker brown thin papule, slightly lighter in center  Left Neck 0.9 cm scaly pink papule  R/o SCC or ISK > BCC       Assessment & Plan  Nevus Left Lower Back  Benign-appearing.  Observation.  Call clinic for new or changing lesions.  Recommend daily use of broad spectrum spf 30+ sunscreen to sun-exposed areas.    Neoplasm of skin Left Neck  Skin / nail biopsy Type of biopsy: tangential   Informed consent: discussed and consent obtained   Timeout: patient name, date of birth, surgical site, and procedure verified   Procedure prep:  Patient was prepped and draped in usual sterile fashion Prep type:  Isopropyl alcohol Anesthesia: the lesion was anesthetized in a standard fashion    Anesthetic:  1% lidocaine w/ epinephrine 1-100,000 buffered w/ 8.4% NaHCO3 Instrument used: flexible razor blade   Hemostasis achieved with: aluminum chloride   Outcome: patient tolerated procedure well   Post-procedure details: wound care instructions given   Additional details:  Petrolatum and a pressure bandage applied  Specimen 1 - Surgical pathology Differential Diagnosis: R/o SCC or ISK > BCC  Check Margins: No 0.9 cm scaly pink papule     Lentigines - Scattered tan macules - Due to sun exposure - Benign-appearing, observe - Recommend daily broad spectrum sunscreen SPF 30+ to sun-exposed areas, reapply every 2 hours as needed. - Call for any changes  Seborrheic Keratoses - Stuck-on, waxy, tan-brown papules and/or plaques  - Benign-appearing - Discussed benign etiology and prognosis. - Observe - Call for any changes  Melanocytic Nevi - Tan-brown and/or pink-flesh-colored symmetric macules and papules - Benign appearing on exam today - Observation - Call clinic for new or changing moles - Recommend daily use of broad spectrum spf 30+ sunscreen to sun-exposed areas.   Hemangiomas - Red papules - Discussed benign nature - Observe - Call for any changes  Actinic Damage - Chronic condition, secondary to cumulative UV/sun exposure - diffuse scaly erythematous macules with underlying dyspigmentation - Recommend daily broad spectrum sunscreen SPF 30+ to sun-exposed areas, reapply every 2 hours as needed.  - Staying in the shade or wearing long sleeves, sun glasses (UVA+UVB protection) and wide brim hats (4-inch brim around the entire circumference of  the hat) are also recommended for sun protection.  - Call for new or changing lesions.  Skin cancer screening performed today.  History of Basal Cell Carcinoma of the Skin - No evidence of recurrence today - Recommend regular full body skin exams - Recommend daily broad spectrum sunscreen SPF 30+ to sun-exposed areas,  reapply every 2 hours as needed.  - Call if any new or changing lesions are noted between office visits  Return in about 6 months (around 06/01/2022) for TBSE, 2-8 week follow up.  Graciella Belton, RMA, am acting as scribe for Forest Gleason, MD .  Documentation: I have reviewed the above documentation for accuracy and completeness, and I agree with the above.  Forest Gleason, MD

## 2021-11-29 NOTE — Patient Instructions (Addendum)
Wound Care Instructions  Cleanse wound gently with soap and water once a day then pat dry with clean gauze. Apply a thing coat of Petrolatum (petroleum jelly, "Vaseline") over the wound (unless you have an allergy to this). We recommend that you use a new, sterile tube of Vaseline. Do not pick or remove scabs. Do not remove the yellow or white "healing tissue" from the base of the wound.  Cover the wound with fresh, clean, nonstick gauze and secure with paper tape. You may use Band-Aids in place of gauze and tape if the would is small enough, but would recommend trimming much of the tape off as there is often too much. Sometimes Band-Aids can irritate the skin.  You should call the office for your biopsy report after 1 week if you have not already been contacted.  If you experience any problems, such as abnormal amounts of bleeding, swelling, significant bruising, significant pain, or evidence of infection, please call the office immediately.  FOR ADULT SURGERY PATIENTS: If you need something for pain relief you may take 1 extra strength Tylenol (acetaminophen) AND 2 Ibuprofen ('200mg'$  each) together every 4 hours as needed for pain. (do not take these if you are allergic to them or if you have a reason you should not take them.) Typically, you may only need pain medication for 1 to 3 days.   Recommend Niacinamide or Nicotinamide '500mg'$  twice per day to lower risk of non-melanoma skin cancer by approximately 25%. This is usually available at [employer].   Melanoma ABCDEs  Melanoma is the most dangerous type of skin cancer, and is the leading cause of death from skin disease.  You are more likely to develop melanoma if you: Have light-colored skin, light-colored eyes, or red or blond hair Spend a lot of time in the sun Tan regularly, either outdoors or in a tanning bed Have had blistering sunburns, especially during childhood Have a close family member who has had a melanoma Have atypical moles  or large birthmarks  Early detection of melanoma is key since treatment is typically straightforward and cure rates are extremely high if we catch it early.   The first sign of melanoma is often a change in a mole or a new dark spot.  The ABCDE system is a way of remembering the signs of melanoma.  A for asymmetry:  The two halves do not match. B for border:  The edges of the growth are irregular. C for color:  A mixture of colors are present instead of an even brown color. D for diameter:  Melanomas are usually (but not always) greater than 70m - the size of a pencil eraser. E for evolution:  The spot keeps changing in size, shape, and color.  Please check your skin once per month between visits. You can use a small mirror in front and a large mirror behind you to keep an eye on the back side or your body.   If you see any new or changing lesions before your next follow-up, please call to schedule a visit.  Please continue daily skin protection including broad spectrum sunscreen SPF 30+ to sun-exposed areas, reapplying every 2 hours as needed when you're outdoors.    Due to recent changes in healthcare laws, you may see results of your pathology and/or laboratory studies on MyChart before the doctors have had a chance to review them. We understand that in some cases there may be results that are confusing or concerning to you.  Please understand that not all results are received at the same time and often the doctors may need to interpret multiple results in order to provide you with the best plan of care or course of treatment. Therefore, we ask that you please give [place] 2 business days to thoroughly review all your results before contacting the office for clarification. Should we see a critical lab result, you will be contacted sooner.   If You Need Anything After Your Visit  If you have any questions or concerns for your doctor, please call our main line at [phone] and press option 4 to  reach your doctor's medical assistant. If no one answers, please leave a voicemail as directed and we will return your call as soon as possible. Messages left after 4 pm will be answered the following business day.   You may also send [place] a message via [place]. We typically respond to MyChart messages within 1-2 business days.  For prescription refills, please ask your pharmacy to contact our office. Our fax number is [phone].  If you have an urgent issue when the clinic is closed that cannot wait until the next business day, you can page your doctor at the number below.    Please note that while we do our best to be available for urgent issues outside of office hours, we are not available 24/7.   If you have an urgent issue and are unable to reach [place], you may choose to seek medical care at your doctor's office, retail clinic, urgent care center, or emergency room.  If you have a medical emergency, please immediately call 911 or go to the emergency department.  Pager Numbers  - Dr. Nehemiah Massed: [phone]  - Dr. Laurence Ferrari: [phone]  - Dr. Nicole Kindred: [phone]  In the event of inclement weather, please call our main line at [phone] for an update on the status of any delays or closures.  Dermatology Medication Tips: Please keep the boxes that topical medications come in in order to help keep track of the instructions about where and how to use these. Pharmacies typically print the medication instructions only on the boxes and not directly on the medication tubes.   If your medication is too expensive, please contact our office at [phone] option 4 or send [place] a message through [place].   We are unable to tell what your co-pay for medications will be in advance as this is different depending on your insurance coverage. However, we may be able to find a substitute medication at lower cost or fill out paperwork to get insurance to cover a needed medication.   If a prior  authorization is required to get your medication covered by your insurance company, please allow [place] 1-2 business days to complete this process.  Drug prices often vary depending on where the prescription is filled and some pharmacies may offer cheaper prices.  The website www.goodrx.com contains coupons for medications through different pharmacies. The prices here do not account for what the cost may be with help from insurance (it may be cheaper with your insurance), but the website can give you the price if you did not use any insurance.  - You can print the associated coupon and take it with your prescription to the pharmacy.  - You may also stop by our office during regular business hours and pick up a GoodRx coupon card.  - If you need your prescription sent electronically to a different pharmacy, notify our office through [facility]  or by phone at [phone] option 4.     Si Usted Necesita Algo Despus de Su Visita  Tambin puede enviarnos un mensaje a travs de Pharmacist, community. Por lo general respondemos a los mensajes de MyChart en el transcurso de 1 a 2 das hbiles.  Para renovar recetas, por favor pida a su farmacia que se ponga en contacto con nuestra oficina. Harland Dingwall de fax es [place] [phone].  Si tiene un asunto urgente cuando la clnica est cerrada y que no puede esperar hasta el siguiente da hbil, puede llamar/localizar a su doctor(a) al nmero que aparece a continuacin.   Por favor, tenga en cuenta que aunque hacemos todo lo posible para estar disponibles para asuntos urgentes fuera del horario de [place], no estamos disponibles las 24 horas del da, los 7 das de la [place].   Si tiene un problema urgente y no puede comunicarse con nosotros, puede optar por buscar atencin mdica  en el consultorio de su doctor(a), en una clnica privada, en un centro de atencin urgente o en una sala de emergencias.  Si tiene Engineering geologist, por favor llame  inmediatamente al 911 o vaya a la sala de emergencias.  Nmeros de bper  - Dr. Nehemiah Massed: [phone]  - Dra. Moye: [phone]  - Dra. Nicole Kindred: [phone]  En caso de inclemencias del [place], por favor llame a Johnsie Kindred principal al [phone] para una actualizacin sobre el [place] de cualquier retraso o cierre.  Consejos para la medicacin en dermatologa: Por favor, guarde las cajas en las que vienen los medicamentos de uso tpico para ayudarle a seguir las instrucciones sobre dnde y cmo usarlos. Las farmacias generalmente imprimen las instrucciones del medicamento slo en las cajas y no directamente en los tubos del [place].   Si su medicamento es muy caro, por favor, pngase en contacto con Zigmund Daniel llamando al [phone] y presione la opcin 4 o envenos un mensaje a travs de Pharmacist, community.   No podemos decirle cul ser su copago por los medicamentos por adelantado ya que esto es diferente dependiendo de la cobertura de su seguro. Sin embargo, es posible que podamos encontrar un medicamento sustituto a Electrical engineer un formulario para que el seguro cubra el medicamento que se considera necesario.   Si se requiere una autorizacin previa para que su compaa de seguros [place] su medicamento, por favor permtanos de 1 a 2 das hbiles para completar este proceso.  Los precios de los medicamentos varan con frecuencia dependiendo del Environmental consultant de dnde se surte la receta y alguna farmacias pueden ofrecer precios ms baratos.  El sitio web www.goodrx.com tiene cupones para medicamentos de Airline pilot. Los precios aqu no tienen en cuenta lo que podra costar con la ayuda del seguro (puede ser ms barato con su seguro), pero el sitio web puede darle el precio si no utiliz Research scientist (physical sciences).  - Puede imprimir el cupn correspondiente y llevarlo con su receta a la farmacia.  - Tambin puede pasar por nuestra oficina durante el horario de atencin regular y Charity fundraiser  una tarjeta de cupones de GoodRx.  - Si necesita que su receta se enve electrnicamente a una farmacia diferente, informe a nuestra oficina a travs de MyChart de [place] o por telfono llamando al [phone] y presione la opcin 4.

## 2021-12-04 ENCOUNTER — Telehealth: Payer: Self-pay

## 2021-12-04 NOTE — Telephone Encounter (Signed)
-----   Message from Alfonso Patten, MD sent at 12/04/2021  9:03 AM EDT ----- Skin , left neck SEBORRHEIC KERATOSIS, INFLAMED   This is a benign growth or "wisdom spot". No additional treatment is needed.     MAs please call. Thank you!

## 2021-12-04 NOTE — Telephone Encounter (Signed)
Patient advised bx results showed benign SK. Lurlean Horns., RMA

## 2021-12-12 ENCOUNTER — Encounter: Payer: Self-pay | Admitting: Dermatology

## 2021-12-13 ENCOUNTER — Ambulatory Visit: Payer: PPO | Admitting: Dermatology

## 2021-12-27 DIAGNOSIS — H903 Sensorineural hearing loss, bilateral: Secondary | ICD-10-CM | POA: Insufficient documentation

## 2021-12-27 DIAGNOSIS — H9313 Tinnitus, bilateral: Secondary | ICD-10-CM | POA: Diagnosis not present

## 2021-12-27 DIAGNOSIS — H9193 Unspecified hearing loss, bilateral: Secondary | ICD-10-CM | POA: Diagnosis not present

## 2022-02-05 ENCOUNTER — Ambulatory Visit (INDEPENDENT_AMBULATORY_CARE_PROVIDER_SITE_OTHER): Payer: PPO | Admitting: Internal Medicine

## 2022-02-05 ENCOUNTER — Encounter: Payer: Self-pay | Admitting: Internal Medicine

## 2022-02-05 VITALS — BP 136/80 | HR 66 | Temp 97.9°F | Ht 61.0 in | Wt 124.4 lb

## 2022-02-05 DIAGNOSIS — H9313 Tinnitus, bilateral: Secondary | ICD-10-CM | POA: Insufficient documentation

## 2022-02-05 DIAGNOSIS — R5383 Other fatigue: Secondary | ICD-10-CM | POA: Diagnosis not present

## 2022-02-05 DIAGNOSIS — H6993 Unspecified Eustachian tube disorder, bilateral: Secondary | ICD-10-CM | POA: Diagnosis not present

## 2022-02-05 DIAGNOSIS — N3000 Acute cystitis without hematuria: Secondary | ICD-10-CM

## 2022-02-05 DIAGNOSIS — E559 Vitamin D deficiency, unspecified: Secondary | ICD-10-CM | POA: Diagnosis not present

## 2022-02-05 DIAGNOSIS — E2839 Other primary ovarian failure: Secondary | ICD-10-CM

## 2022-02-05 DIAGNOSIS — E785 Hyperlipidemia, unspecified: Secondary | ICD-10-CM | POA: Diagnosis not present

## 2022-02-05 DIAGNOSIS — Z2911 Encounter for prophylactic immunotherapy for respiratory syncytial virus (RSV): Secondary | ICD-10-CM | POA: Diagnosis not present

## 2022-02-05 DIAGNOSIS — Z1329 Encounter for screening for other suspected endocrine disorder: Secondary | ICD-10-CM

## 2022-02-05 DIAGNOSIS — M8580 Other specified disorders of bone density and structure, unspecified site: Secondary | ICD-10-CM

## 2022-02-05 DIAGNOSIS — Z1389 Encounter for screening for other disorder: Secondary | ICD-10-CM | POA: Diagnosis not present

## 2022-02-05 MED ORDER — FLUTICASONE PROPIONATE 50 MCG/ACT NA SUSP: 2.0000 | Freq: Every day | NASAL | 11 refills | Status: DC

## 2022-02-05 MED ORDER — RSVPREF3 VAC RECOMB ADJUVANTED 120 MCG/0.5ML IM SUSR: 0.5000 mL | Freq: Once | INTRAMUSCULAR | 0 refills | Status: AC

## 2022-02-05 MED ORDER — ATORVASTATIN CALCIUM 10 MG PO TABS: 10.0000 mg | ORAL_TABLET | Freq: Every day | ORAL | 3 refills | Status: DC

## 2022-02-05 NOTE — Patient Instructions (Addendum)
RSV vaccine   Due 8.30.24  Pneumococcal Conjugate Vaccine (Prevnar 20) Suspension for Injection What is this medication? PNEUMOCOCCAL VACCINE (NEU mo KOK al vak SEEN) is a vaccine. It prevents pneumococcus bacterial infections. These bacteria can cause serious infections like pneumonia, meningitis, and blood infections. This vaccine will not treat an infection and will not cause infection. This vaccine is recommended for adults 18 years and older. This medicine may be used for other purposes; ask your health care provider or pharmacist if you have questions. COMMON BRAND NAME(S): Prevnar 20 What should I tell my care team before I take this medication? They need to know if you have any of these conditions: bleeding disorder fever immune system problems an unusual or allergic reaction to pneumococcal vaccine, diphtheria toxoid, other vaccines, other medicines, foods, dyes, or preservatives pregnant or trying to get pregnant breast-feeding How should I use this medication? This vaccine is injected into a muscle. It is given by a health care provider. A copy of Vaccine Information Statements will be given before each vaccination. Be sure to read this information carefully each time. This sheet may change often. Talk to your health care provider about the use of this medicine in children. Special care may be needed. Overdosage: If you think you have taken too much of this medicine contact a poison control center or emergency room at once. NOTE: This medicine is only for you. Do not share this medicine with others. What if I miss a dose? This does not apply. This medicine is not for regular use. What may interact with this medication? medicines for cancer chemotherapy medicines that suppress your immune function steroid medicines like prednisone or cortisone This list may not describe all possible interactions. Give your health care provider a list of all the medicines, herbs, non-prescription  drugs, or dietary supplements you use. Also tell them if you smoke, drink alcohol, or use illegal drugs. Some items may interact with your medicine. What should I watch for while using this medication? Mild fever and pain should go away in 3 days or less. Report any unusual symptoms to your health care provider. What side effects may I notice from receiving this medication? Side effects that you should report to your doctor or health care professional as soon as possible: allergic reactions (skin rash, itching or hives; swelling of the face, lips, or tongue) confusion fast, irregular heartbeat fever over 102 degrees F muscle weakness seizures trouble breathing unusual bruising or bleeding Side effects that usually do not require medical attention (report to your doctor or health care professional if they continue or are bothersome): fever of 102 degrees F or less headache joint pain muscle cramps, pain pain, tender at site where injected This list may not describe all possible side effects. Call your doctor for medical advice about side effects. You may report side effects to FDA at 1-800-FDA-1088. Where should I keep my medication? This vaccine is only given by a health care provider. It will not be stored at home. NOTE: This sheet is a summary. It may not cover all possible information. If you have questions about this medicine, talk to your doctor, pharmacist, or health care provider.  2023 Elsevier/Gold Standard (2019-12-17 00:00:00)

## 2022-02-05 NOTE — Progress Notes (Signed)
Chief Complaint  Patient presents with   Follow-up    4 month f/u   F/u  1. Hld controlled on lipitor 10 2. C/o tinnitis took flavinoid and helped seen ent hearing checked disc consider MRI brain IACS in the future     Review of Systems  Constitutional:  Negative for weight loss.  HENT:  Positive for tinnitus. Negative for hearing loss.   Eyes:  Negative for blurred vision.  Respiratory:  Negative for shortness of breath.   Cardiovascular:  Negative for chest pain.  Gastrointestinal:  Negative for abdominal pain and blood in stool.  Genitourinary:  Negative for dysuria.  Musculoskeletal:  Negative for falls and joint pain.  Skin:  Negative for rash.  Neurological:  Negative for headaches.  Psychiatric/Behavioral:  Negative for depression.    Past Medical History:  Diagnosis Date   Arthritis    Basal cell carcinoma 12/10/2017   Left alar crease. Micronodular pattern   Basal cell carcinoma 12/10/2017   Left upper back. Nodular.    Basal cell carcinoma 12/10/2017   Right medial mid back. Superficial.   Basal cell carcinoma 12/10/2017   Right lateral mid back. Nodular   Basal cell carcinoma 12/24/2018   Right mid back. Nodular   Basal cell carcinoma 12/24/2018   Left anterior shoulder. Nodular   BCC (basal cell carcinoma of skin)    07/02/21 left lower eyelid mohs Dr Shelda Jakes   [facility] (basal cell carcinoma) 05/23/2021   left lower eyelid, Mohs completed 07/02/2021   Breast mass    right   Cancer ([place])    skin cancer SCC, BCC multiple    COVID-19    11/22/20   Headache    History of bronchitis    History of pneumonia    Hyperlipidemia    Past Surgical History:  Procedure Laterality Date   BREAST LUMPECTOMY Right 2016   right 2016 negative malignancy fibrocystic changes    COLONOSCOPY     COLONOSCOPY WITH PROPOFOL N/A 09/17/2021   Procedure: COLONOSCOPY WITH PROPOFOL;  Surgeon: [name], DO;  Location: [facility] ENDOSCOPY;  Service: Gastroenterology;  Laterality:  N/A;   DILATION AND CURETTAGE OF UTERUS     JOINT REPLACEMENT     left    MASTECTOMY PARTIAL / LUMPECTOMY     10-15 years ago   MOHS SURGERY     x2 face    MOHS SURGERY     L alar 02/04/18    RADIOACTIVE SEED GUIDED EXCISIONAL BREAST BIOPSY Right 04/06/2015   Procedure: RADIOACTIVE SEED GUIDED EXCISIONAL RIGHT BREAST BIOPSY;  Surgeon: Stark Klein, MD;  Location: [place];  Service: General;  Laterality: Right;   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   Procedure: TOTAL HIP ARTHROPLASTY ANTERIOR APPROACH;  Surgeon: Renette Butters, MD;  Location: [place];  Service: Orthopedics;  Laterality: Left;   WISDOM TOOTH EXTRACTION     Family History  Problem Relation Age of Onset   Stroke Mother    Alzheimer's disease Father    Social History   Socioeconomic History   Marital status: Widowed    Spouse name: Not on file   Number of children: Not on file   Years of education: Not on file   Highest education level: Not on file  Occupational History   Not on file  Tobacco Use   Smoking status: Never   Smokeless tobacco: Never  Vaping Use   Vaping Use: Never used  Substance and Sexual Activity   Alcohol use: Yes  Comment: 2-3 drinks/week,none last 24rs   Drug use: No   Sexual activity: Not Currently  Other Topics Concern   Not on file  Social History Narrative   Widowed husband died 2018-10-28    1 son lives in [place]; also has step kids (son and daughter though deceased husband [place])   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    Owns guns, wear seat belt    Safe in relationship    1 dog Viszla named Biochemist, clinical   Social Determinants of Health   Financial Resource Strain: Low Risk  (06/28/2021)   Overall Financial Resource Strain (CARDIA)    Difficulty of Paying Living Expenses: Not hard at all  Food Insecurity: No Food Insecurity (06/28/2021)   Hunger Vital Sign    Worried About Running Out of Food in the Last Year: Never true    Ran Out of Food in the Last Year: Never true   Transportation Needs: No Transportation Needs (06/28/2021)   PRAPARE - Hydrologist (Medical): No    Lack of Transportation (Non-Medical): No  Physical Activity: Sufficiently Active (06/28/2021)   Exercise Vital Sign    Days of Exercise per Week: 5 days    Minutes of Exercise per Session: 60 min  Stress: No Stress Concern Present (06/28/2021)   [place]    Feeling of Stress : Not at all  Social Connections: Unknown (06/28/2021)   Social Connection and Isolation Panel [NHANES]    Frequency of Communication with Friends and Family: More than three times a week    Frequency of Social Gatherings with Friends and Family: More than three times a week    Attends Religious Services: Not on file    Active Member of Clubs or Organizations: Not on file    Attends Archivist Meetings: Not on file    Marital Status: Not on file  Intimate Partner Violence: Not At Risk (06/28/2021)   Humiliation, Afraid, Rape, and Kick questionnaire    Fear of Current or Ex-Partner: No    Emotionally Abused: No    Physically Abused: No    Sexually Abused: No   Current Meds  Medication Sig   Acetaminophen (TYLENOL ARTHRITIS PAIN PO) Take by mouth.   Alpha Lipoic Acid-Vitamin E (LIPO-GEL PO) Take 2 capsules by mouth.   Apoaequorin (PREVAGEN EXTRA STRENGTH PO) Take by mouth.   aspirin 81 MG tablet Take 81 mg by mouth daily.   Biotin 10 MG CAPS Take 1,000 mg by mouth daily.    Calcium Carb-Cholecalciferol (CVS CALCIUM 600 & VITAMIN D3 PO) Take by mouth 2 (two) times daily after a meal.   fluorouracil (EFUDEX) 5 % cream Apply topically 2 (two) times daily. Apply twice daily for 4 days, may repeat in 1 month.   ketoconazole (NIZORAL) 2 % shampoo APPLY THREE TIMES PER WEEK, MASSAGE INTO SCALP AND LEAVE IN FOR 10 MINUTES BEFORE RINSING OUT   Multiple Vitamin (MULTIVITAMIN WITH MINERALS) TABS tablet Take 1 tablet by mouth  daily.   RSV vaccine recomb adjuvanted (AREXVY) 120 MCG/0.5ML injection Inject 0.5 mLs into the muscle once for 1 dose. New vaccine   scopolamine (TRANSDERM-SCOP) 1 MG/3DAYS Place 1 patch (1.5 mg total) onto the skin every 3 (three) days. Motion sickness   sodium chloride (OCEAN) 0.65 % SOLN nasal spray Place 2 sprays into both nostrils daily as needed for congestion.   TURMERIC PO Take by mouth daily.  vitamin C (ASCORBIC ACID) 250 MG tablet Take 500 mg by mouth daily. Taking two tablets daily   [DISCONTINUED] atorvastatin (LIPITOR) 10 MG tablet Take 1 tablet (10 mg total) by mouth daily at 6 PM.   [DISCONTINUED] fluticasone (FLONASE) 50 MCG/ACT nasal spray Place 2 sprays into both nostrils daily.   Allergies  Allergen Reactions   Aspartame And Phenylalanine    Meloxicam Hives and Diarrhea   Other Itching    Sunscreens Lotions and Tanning products   Paxlovid [Nirmatrelvir-Ritonavir] Diarrhea   No results found for this or any previous visit (from the past 2160 hour(s)). Objective  Body mass index is 23.51 kg/m. Wt Readings from Last 3 Encounters:  02/05/22 124 lb 6.4 oz (56.4 kg)  10/04/21 124 lb 6.4 oz (56.4 kg)  09/17/21 119 lb (54 kg)   Temp Readings from Last 3 Encounters:  02/05/22 97.9 F (36.6 C) (Oral)  10/04/21 98.3 F (36.8 C) (Oral)  09/17/21 (!) 96.5 F (35.8 C) (Temporal)   BP Readings from Last 3 Encounters:  02/05/22 136/80  10/04/21 124/70  09/17/21 109/88   Pulse Readings from Last 3 Encounters:  02/05/22 66  10/04/21 68  09/17/21 68    Physical Exam Vitals and nursing note reviewed.  Constitutional:      Appearance: Normal appearance. She is well-developed and well-groomed.  HENT:     Head: Normocephalic and atraumatic.  Eyes:     Conjunctiva/sclera: Conjunctivae normal.     Pupils: Pupils are equal, round, and reactive to light.  Cardiovascular:     Rate and Rhythm: Normal rate and regular rhythm.     Heart sounds: Normal heart sounds. No  murmur heard. Pulmonary:     Effort: Pulmonary effort is normal.     Breath sounds: Normal breath sounds.  Abdominal:     General: Abdomen is flat. Bowel sounds are normal.     Tenderness: There is no abdominal tenderness.  Musculoskeletal:        General: No tenderness.  Skin:    General: Skin is warm and dry.  Neurological:     General: No focal deficit present.     Mental Status: She is alert and oriented to person, place, and time. Mental status is at baseline.     Cranial Nerves: Cranial nerves 2-12 are intact.     Motor: Motor function is intact.     Coordination: Coordination is intact.     Gait: Gait is intact.  Psychiatric:        Attention and Perception: Attention and perception normal.        Mood and Affect: Mood and affect normal.        Speech: Speech normal.        Behavior: Behavior normal. Behavior is cooperative.        Thought Content: Thought content normal.        Cognition and Memory: Cognition and memory normal.        Judgment: Judgment normal.     Assessment  Plan  Hyperlipidemia, unspecified hyperlipidemia type - Plan: atorvastatin (LIPITOR) 10 MG tablet, Comprehensive metabolic panel, Lipid panel, CBC with Differential/Platelet   Disorder of both eustachian tubes/tinnitis- Plan: fluticasone (FLONASE) 50 MCG/ACT nasal spray Hearing nl  F/u ent  Consider MRI brain IACS in the future   HM Had flu shot utd shingrix 2/2  utd Tdap  utd prevnar and pna 23  Rsv rx today  Prevnar 20 due 12/27/22   covid 5/5   Out of  age window pap  07/31/20 neg mammo , 08/02/21 negative DEXA 05/28/2018 osteopenia repeat in 2023  cologuard 06/08/20 negative ordered gi referral [facility] [place]21 [facility] gi Dr. Virgina Jock colonoscopy  tubular adenoma Dr. Virgina Jock f/u in 7 years rectal polyp neg f/u 7 years due to beyong age 32 and neg prev and no FH rec shared decision per pt and PCP if will repeat in 7 years H/o skin SCC, BCC multiple f/u Dr.  Laurence Ferrari last seen fall 2020  mohs 3rd to nose 01/2018 Dr. Elvera Bicker [facility]    [facility] left alar nose mohs on 02/04/18 Dr. Manley Mason, derm f/u 05/2018  appt derm 01/2020 f/u Q6 months    Cont work out ymca 5x per week  Rec healthy diet and exercise   Provider: Dr. Olivia Mackie McLean-Scocuzza-Internal Medicine

## 2022-02-26 ENCOUNTER — Other Ambulatory Visit: Payer: Self-pay | Admitting: Internal Medicine

## 2022-02-26 DIAGNOSIS — M8580 Other specified disorders of bone density and structure, unspecified site: Secondary | ICD-10-CM

## 2022-02-26 DIAGNOSIS — E2839 Other primary ovarian failure: Secondary | ICD-10-CM

## 2022-04-05 ENCOUNTER — Ambulatory Visit (INDEPENDENT_AMBULATORY_CARE_PROVIDER_SITE_OTHER): Payer: PPO | Admitting: Family

## 2022-04-05 ENCOUNTER — Encounter: Payer: Self-pay | Admitting: Family

## 2022-04-05 VITALS — BP 128/82 | HR 73 | Temp 98.4°F | Wt 122.6 lb

## 2022-04-05 DIAGNOSIS — Z01818 Encounter for other preprocedural examination: Secondary | ICD-10-CM | POA: Insufficient documentation

## 2022-04-05 NOTE — Patient Instructions (Addendum)
Your blood type is O negative.   [email or web address]   https://[email or web address].org/treatments/[email or web address]-program/liver-[email or web address]#:~:text=The%20Living%20Liver%20Donation%20Process&text=A%20comprehensive%20physical%20and%20psychological,their%20normal%20lives%20after%20surgery.  Please let me know if you would like referral to either [facility] or [facility].

## 2022-04-05 NOTE — Assessment & Plan Note (Signed)
Long discussion with patient and her desire to learn more about being a potential living liver donor for her son.   Although our discussion was preliminary , I advised patient this would be an extensive evaluation psychological and physical to ensure that she is safe to undergo surgery, live without a portion of her liver ,as well as her son being a good recipient for her liver and managed by a transplant team.  I provided her with to resources at [place] for living liver transplant.  We found [facility] survey for her to start and phone numbers to call.   She will call both [facility], [facility] and let me know how the process is going.  If she would like a formal referral to either [facility] or [facility], I am more than happy to place. She will let me know.

## 2022-04-05 NOTE — Progress Notes (Signed)
Subjective:    Patient ID: Andrea Spencer, female    DOB: 09/26/1945, 76 y.o.   MRN: [id number]  CC: Andrea Spencer is a 76 y.o. female who presents today for follow up.   HPI: Here today to discuss being a living liver transplant donor for her son.  She feels well today and no concerns for herself.  Son lives in [place] and has served in the army in his 20's . Approx 4 months ago, he was diagnosed with liver cancer from exposure to chemicals in [place].   He is currently undergoing chemotherapy. She isnt sure if he will require a full liver transplant with a portion. She has no history of liver disease.      HISTORY:  Past Medical History:  Diagnosis Date   Arthritis    Basal cell carcinoma 12/10/2017   Left alar crease. Micronodular pattern   Basal cell carcinoma 12/10/2017   Left upper back. Nodular.    Basal cell carcinoma 12/10/2017   Right medial mid back. Superficial.   Basal cell carcinoma 12/10/2017   Right lateral mid back. Nodular   Basal cell carcinoma 12/24/2018   Right mid back. Nodular   Basal cell carcinoma 12/24/2018   Left anterior shoulder. Nodular   BCC (basal cell carcinoma of skin)    07/02/21 left lower eyelid mohs Dr Shelda Jakes   [facility] (basal cell carcinoma) 05/23/2021   left lower eyelid, Mohs completed 07/02/2021   Breast mass    right   Cancer ([place])    skin cancer SCC, BCC multiple    COVID-19    11/22/20   Headache    History of bronchitis    History of pneumonia    Hyperlipidemia    Past Surgical History:  Procedure Laterality Date   BREAST LUMPECTOMY Right 2016   right 2016 negative malignancy fibrocystic changes    COLONOSCOPY     COLONOSCOPY WITH PROPOFOL N/A 09/17/2021   Procedure: COLONOSCOPY WITH PROPOFOL;  Surgeon: [name], DO;  Location: [facility] ENDOSCOPY;  Service: Gastroenterology;  Laterality: N/A;   DILATION AND CURETTAGE OF UTERUS     JOINT REPLACEMENT     left    MASTECTOMY PARTIAL / LUMPECTOMY      10-15 years ago   MOHS SURGERY     x2 face    MOHS SURGERY     L alar 02/04/18    RADIOACTIVE SEED GUIDED EXCISIONAL BREAST BIOPSY Right 04/06/2015   Procedure: RADIOACTIVE SEED GUIDED EXCISIONAL RIGHT BREAST BIOPSY;  Surgeon: Stark Klein, MD;  Location: [place];  Service: General;  Laterality: Right;   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   Procedure: TOTAL HIP ARTHROPLASTY ANTERIOR APPROACH;  Surgeon: Renette Butters, MD;  Location: [place];  Service: Orthopedics;  Laterality: Left;   WISDOM TOOTH EXTRACTION     Family History  Problem Relation Age of Onset   Stroke Mother    Alzheimer's disease Father     Allergies: Aspartame and phenylalanine, Meloxicam, Other, and Paxlovid [nirmatrelvir-ritonavir] Current Outpatient Medications on File Prior to Visit  Medication Sig Dispense Refill   Apoaequorin (PREVAGEN EXTRA STRENGTH PO) Take by mouth.     aspirin 81 MG tablet Take 81 mg by mouth daily.     atorvastatin (LIPITOR) 10 MG tablet Take 1 tablet (10 mg total) by mouth daily at 6 PM. 90 tablet 3   loratadine (CLARITIN) 10 MG tablet Take 1 tablet (10 mg total) by mouth at bedtime as needed for allergies. [place]  tablet 3   Multiple Vitamin (MULTIVITAMIN WITH MINERALS) TABS tablet Take 1 tablet by mouth daily.     sodium chloride (OCEAN) 0.65 % SOLN nasal spray Place 2 sprays into both nostrils daily as needed for congestion. 30 mL 11   TURMERIC PO Take by mouth daily.     Acetaminophen (TYLENOL ARTHRITIS PAIN PO) Take by mouth. (Patient not taking: Reported on 04/05/2022)     Alpha Lipoic Acid-Vitamin E (LIPO-GEL PO) Take 2 capsules by mouth. (Patient not taking: Reported on 04/05/2022)     Biotin 10 MG CAPS Take 1,000 mg by mouth daily.  (Patient not taking: Reported on 04/05/2022)     Calcium Carb-Cholecalciferol (CVS CALCIUM 600 & VITAMIN D3 PO) Take by mouth 2 (two) times daily after a meal.     cholecalciferol (VITAMIN D) 1000 UNITS tablet Take 1,000 Units by mouth daily. (Patient not  taking: Reported on 02/05/2022)     fluorouracil (EFUDEX) 5 % cream Apply topically 2 (two) times daily. Apply twice daily for 4 days, may repeat in 1 month. (Patient not taking: Reported on 04/05/2022) 15 g 0   fluticasone (FLONASE) 50 MCG/ACT nasal spray Place 2 sprays into both nostrils daily. (Patient not taking: Reported on 04/05/2022) 16 g 11   ketoconazole (NIZORAL) 2 % shampoo APPLY THREE TIMES PER WEEK, MASSAGE INTO SCALP AND LEAVE IN FOR 10 MINUTES BEFORE RINSING OUT (Patient not taking: Reported on 04/05/2022) 120 mL 11   Krill Oil 1000 MG CAPS Take 3 capsules by mouth daily.  (Patient not taking: Reported on 10/04/2021)     scopolamine (TRANSDERM-SCOP) 1 MG/3DAYS Place 1 patch (1.5 mg total) onto the skin every 3 (three) days. Motion sickness (Patient not taking: Reported on 04/05/2022) 3 patch 0   triamcinolone ointment (KENALOG) 0.1 % Apply twice daily to affected areas up to 2 weeks as needed for itch. Avoid applying to face, groin, and axilla. Use as directed. Risk of skin atrophy with long-term use reviewed. (Patient not taking: Reported on 02/05/2022) 60 g 2   vitamin C (ASCORBIC ACID) 250 MG tablet Take 500 mg by mouth daily. Taking two tablets daily (Patient not taking: Reported on 04/05/2022)     No current facility-administered medications on file prior to visit.    Social History   Tobacco Use   Smoking status: Never   Smokeless tobacco: Never  Vaping Use   Vaping Use: Never used  Substance Use Topics   Alcohol use: Yes    Comment: 2-3 drinks/week,none last 24rs   Drug use: No    Review of Systems  Constitutional:  Negative for chills and fever.  Respiratory:  Negative for cough.   Cardiovascular:  Negative for chest pain and palpitations.  Gastrointestinal:  Negative for nausea and vomiting.      Objective:    BP 128/82   Pulse 73   Temp 98.4 F (36.9 C) (Oral)   Wt 122 lb 9.6 oz (55.6 kg)   SpO2 97%   BMI 23.17 kg/m  BP Readings from Last 3 Encounters:   04/05/22 128/82  02/05/22 136/80  10/04/21 124/70   Wt Readings from Last 3 Encounters:  04/05/22 122 lb 9.6 oz (55.6 kg)  02/05/22 124 lb 6.4 oz (56.4 kg)  10/04/21 124 lb 6.4 oz (56.4 kg)    Physical Exam Vitals reviewed.  Constitutional:      Appearance: She is well-developed.  Eyes:     Conjunctiva/sclera: Conjunctivae normal.  Cardiovascular:     Rate and  Rhythm: Normal rate and regular rhythm.     Pulses: Normal pulses.     Heart sounds: Normal heart sounds.  Pulmonary:     Effort: Pulmonary effort is normal.     Breath sounds: Normal breath sounds. No wheezing, rhonchi or rales.  Skin:    General: Skin is warm and dry.  Neurological:     Mental Status: She is alert.  Psychiatric:        Speech: Speech normal.        Behavior: Behavior normal.        Thought Content: Thought content normal.        Assessment & Plan:   Problem List Items Addressed This Visit       Other   Encounter for preoperative assessment - Primary    Long discussion with patient and her desire to learn more about being a potential living liver donor for her son.   Although our discussion was preliminary , I advised patient this would be an extensive evaluation psychological and physical to ensure that she is safe to undergo surgery, live without a portion of her liver ,as well as her son being a good recipient for her liver and managed by a transplant team.  I provided her with to resources at [place] for living liver transplant.  We found [facility] survey for her to start and phone numbers to call.   She will call both [facility], [facility] and let me know how the process is going.  If she would like a formal referral to either [facility] or [facility], I am more than happy to place. She will let me know.         I am having Flynn R. Saraceni maintain her [employer], multivitamin with minerals, cholecalciferol, Biotin, vitamin C, aspirin, Calcium Carb-Cholecalciferol (CVS CALCIUM 600 & VITAMIN D3 PO),  Acetaminophen (TYLENOL ARTHRITIS PAIN PO), Apoaequorin (PREVAGEN EXTRA STRENGTH PO), TURMERIC PO, triamcinolone ointment, scopolamine, fluorouracil, Alpha Lipoic Acid-Vitamin E (LIPO-GEL PO), ketoconazole, sodium chloride, loratadine, fluticasone, and atorvastatin.   No orders of the defined types were placed in this encounter.   Return precautions given.   Risks, benefits, and alternatives of the medications and treatment plan prescribed today were discussed, and patient expressed understanding.   Education regarding symptom management and diagnosis given to patient on AVS.  Continue to follow with McLean-Scocuzza, Nino Glow, MD for routine health maintenance.   Andrea Spencer and I agreed with plan.   Mable Paris, FNP

## 2022-06-06 ENCOUNTER — Ambulatory Visit: Payer: PPO | Admitting: Dermatology

## 2022-06-06 ENCOUNTER — Encounter: Payer: Self-pay | Admitting: Dermatology

## 2022-06-06 VITALS — BP 125/70 | HR 81

## 2022-06-06 DIAGNOSIS — L821 Other seborrheic keratosis: Secondary | ICD-10-CM

## 2022-06-06 DIAGNOSIS — C44619 Basal cell carcinoma of skin of left upper limb, including shoulder: Secondary | ICD-10-CM

## 2022-06-06 DIAGNOSIS — L814 Other melanin hyperpigmentation: Secondary | ICD-10-CM

## 2022-06-06 DIAGNOSIS — L578 Other skin changes due to chronic exposure to nonionizing radiation: Secondary | ICD-10-CM

## 2022-06-06 DIAGNOSIS — Z1283 Encounter for screening for malignant neoplasm of skin: Secondary | ICD-10-CM | POA: Diagnosis not present

## 2022-06-06 DIAGNOSIS — Z85828 Personal history of other malignant neoplasm of skin: Secondary | ICD-10-CM

## 2022-06-06 DIAGNOSIS — S00412A Abrasion of left ear, initial encounter: Secondary | ICD-10-CM | POA: Diagnosis not present

## 2022-06-06 DIAGNOSIS — L988 Other specified disorders of the skin and subcutaneous tissue: Secondary | ICD-10-CM | POA: Diagnosis not present

## 2022-06-06 DIAGNOSIS — T148XXA Other injury of unspecified body region, initial encounter: Secondary | ICD-10-CM

## 2022-06-06 DIAGNOSIS — D492 Neoplasm of unspecified behavior of bone, soft tissue, and skin: Secondary | ICD-10-CM

## 2022-06-06 DIAGNOSIS — B009 Herpesviral infection, unspecified: Secondary | ICD-10-CM

## 2022-06-06 MED ORDER — VALACYCLOVIR HCL 500 MG PO TABS: ORAL_TABLET | ORAL | 0 refills | Status: DC

## 2022-06-06 NOTE — Progress Notes (Signed)
Follow-Up Visit   Subjective  Andrea Spencer is a 77 y.o. female who presents for the following: Annual Exam (Hx of multiple BCCs. Concerned with brown spots on face and irritated areas on back).  The patient presents for Total-Body Skin Exam (TBSE) for skin cancer screening and mole check.  The patient has spots, moles and lesions to be evaluated, some may be new or changing and the patient has concerns that these could be cancer.  The following portions of the chart were reviewed this encounter and updated as appropriate:  Tobacco  Allergies  Meds  Problems  Med Hx  Surg Hx  Fam Hx      Review of Systems: No other skin or systemic complaints except as noted in HPI or Assessment and Plan.   Objective  Well appearing patient in no apparent distress; mood and affect are within normal limits.  A full examination was performed including scalp, head, eyes, ears, nose, lips, neck, chest, axillae, abdomen, back, buttocks, bilateral upper extremities, bilateral lower extremities, hands, feet, fingers, toes, fingernails, and toenails. All findings within normal limits unless otherwise noted below.  Left Ear excoriation  face Scattered tan macules.   left anterior shoulder 0.2 cm pink and brown papule within 1.4 cm hypopigmented plaque with rolled borders      Assessment & Plan   History of Basal Cell Carcinoma of the Skin - No evidence of recurrence today - Recommend regular full body skin exams - Recommend daily broad spectrum sunscreen SPF 30+ to sun-exposed areas, reapply every 2 hours as needed.  - Call if any new or changing lesions are noted between office visits    Lentigines - Scattered tan macules - Due to sun exposure - Benign-appearing, observe - Recommend daily broad spectrum sunscreen SPF 30+ to sun-exposed areas, reapply every 2 hours as needed. - Call for any changes  Seborrheic Keratoses - Stuck-on, waxy, tan-brown papules and/or plaques  -  Benign-appearing - Discussed benign etiology and prognosis. - Observe - Call for any changes  Melanocytic Nevi - Tan-brown and/or pink-flesh-colored symmetric macules and papules - Benign appearing on exam today - Observation - Call clinic for new or changing moles - Recommend daily use of broad spectrum spf 30+ sunscreen to sun-exposed areas.   Hemangiomas - Red papules - Discussed benign nature - Observe - Call for any changes  Actinic Damage - Chronic condition, secondary to cumulative UV/sun exposure - diffuse scaly erythematous macules with underlying dyspigmentation - Recommend daily broad spectrum sunscreen SPF 30+ to sun-exposed areas, reapply every 2 hours as needed.  - Staying in the shade or wearing long sleeves, sun glasses (UVA+UVB protection) and wide brim hats (4-inch brim around the entire circumference of the hat) are also recommended for sun protection.  - Call for new or changing lesions.  Skin cancer screening performed today.  Excoriation Left Ear  Vs AK  Apply vaseline to help heal.  RTC in 6-8 weeks if not resolved.   Call if worsening.  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    Plan The Perfect Peel.  Start Valacyclovir 500 mg one tablet twice daily for 10 days, begin 24-36 hours prior to appointment for chemical peel.   valACYclovir (VALTREX) 500 MG tablet - face Take one tablet twice daily for 10 days, begin 24-36 hours prior to appointment for chemical peel.  Neoplasm of skin left anterior shoulder  Skin / nail biopsy Type of biopsy:  tangential   Informed consent: discussed and consent obtained   Anesthesia: the lesion was anesthetized in a standard fashion   Anesthesia comment:  Area prepped with alcohol Anesthetic:  1% lidocaine w/ epinephrine 1-100,000 buffered w/ 8.4% NaHCO3 Instrument used: flexible razor blade   Hemostasis achieved with: pressure, aluminum chloride and  electrodesiccation   Outcome: patient tolerated procedure well   Post-procedure details: wound care instructions given   Post-procedure details comment:  Ointment and small bandage applied  Specimen 1 - Surgical pathology Differential Diagnosis: R/O Recurrent BCC  Check Margins: No   Return in about 6 months (around 12/05/2022) for TBSE, HxBCCs; The Perfect Peel Next Available .   I, Emelia Salisbury, CMA, am acting as scribe for Forest Gleason, MD.  Documentation: I have reviewed the above documentation for accuracy and completeness, and I agree with the above.  Forest Gleason, MD

## 2022-06-06 NOTE — Patient Instructions (Addendum)
Prior to appointment for Chemical Peel  Start Valacyclovir 500 mg one tablet twice daily for 10 days, begin 24-36 hours prior to appointment for chemical peel.     For skin cancer prevention. Recommend Niacinamide or Nicotinamide '500mg'$  twice per day to lower risk of non-melanoma skin cancer by approximately 25%. This is usually available at [employer].     Wound Care Instructions  Cleanse wound gently with soap and water once a day then pat dry with clean gauze. Apply a thin coat of Petrolatum (petroleum jelly, "Vaseline") over the wound (unless you have an allergy to this). We recommend that you use a new, sterile tube of Vaseline. Do not pick or remove scabs. Do not remove the yellow or white "healing tissue" from the base of the wound.  Cover the wound with fresh, clean, nonstick gauze and secure with paper tape. You may use Band-Aids in place of gauze and tape if the wound is small enough, but would recommend trimming much of the tape off as there is often too much. Sometimes Band-Aids can irritate the skin.  You should call the office for your biopsy report after 1 week if you have not already been contacted.  If you experience any problems, such as abnormal amounts of bleeding, swelling, significant bruising, significant pain, or evidence of infection, please call the office immediately.  FOR ADULT SURGERY PATIENTS: If you need something for pain relief you may take 1 extra strength Tylenol (acetaminophen) AND 2 Ibuprofen ('200mg'$  each) together every 4 hours as needed for pain. (do not take these if you are allergic to them or if you have a reason you should not take them.) Typically, you may only need pain medication for 1 to 3 days.      Recommend daily broad spectrum sunscreen SPF 30+ to sun-exposed areas, reapply every 2 hours as needed. Call for new or changing lesions.  Staying in the shade or wearing long sleeves, sun glasses (UVA+UVB protection) and wide brim hats (4-inch  brim around the entire circumference of the hat) are also recommended for sun protection.    Recommend taking Heliocare sun protection supplement daily in sunny weather for additional sun protection. For maximum protection on the sunniest days, you can take up to 2 capsules of regular Heliocare OR take 1 capsule of Heliocare Ultra. For prolonged exposure (such as a full day in the sun), you can repeat your dose of the supplement 4 hours after your first dose. Heliocare can be purchased at [employer], at some [facility] or at [email or web address].       Melanoma ABCDEs  Melanoma is the most dangerous type of skin cancer, and is the leading cause of death from skin disease.  You are more likely to develop melanoma if you: Have light-colored skin, light-colored eyes, or red or blond hair Spend a lot of time in the sun Tan regularly, either outdoors or in a tanning bed Have had blistering sunburns, especially during childhood Have a close family member who has had a melanoma Have atypical moles or large birthmarks  Early detection of melanoma is key since treatment is typically straightforward and cure rates are extremely high if we catch it early.   The first sign of melanoma is often a change in a mole or a new dark spot.  The ABCDE system is a way of remembering the signs of melanoma.  A for asymmetry:  The two halves do not match. B for border:  The edges of the  growth are irregular. C for color:  A mixture of colors are present instead of an even brown color. D for diameter:  Melanomas are usually (but not always) greater than 1m - the size of a pencil eraser. E for evolution:  The spot keeps changing in size, shape, and color.  Please check your skin once per month between visits. You can use a small mirror in front and a large mirror behind you to keep an eye on the back side or your body.   If you see any new or changing lesions before your next follow-up, please call to  schedule a visit.  Please continue daily skin protection including broad spectrum sunscreen SPF 30+ to sun-exposed areas, reapplying every 2 hours as needed when you're outdoors.   Staying in the shade or wearing long sleeves, sun glasses (UVA+UVB protection) and wide brim hats (4-inch brim around the entire circumference of the hat) are also recommended for sun protection.    Due to recent changes in healthcare laws, you may see results of your pathology and/or laboratory studies on MyChart before the doctors have had a chance to review them. We understand that in some cases there may be results that are confusing or concerning to you. Please understand that not all results are received at the same time and often the doctors may need to interpret multiple results in order to provide you with the best plan of care or course of treatment. Therefore, we ask that you please give u[place]2 business days to thoroughly review all your results before contacting the office for clarification. Should we see a critical lab result, you will be contacted sooner.   If You Need Anything After Your Visit  If you have any questions or concerns for your doctor, please call our main line at [phone] press option 4 to reach your doctor's medical assistant. If no one answers, please leave a voicemail as directed and we will return your call as soon as possible. Messages left after 4 pm will be answered the following business day.   You may also send [place] message via [facility] We typically respond to MyChart messages within 1-2 business days.  For prescription refills, please ask your pharmacy to contact our office. Our fax number is [phone]  If you have an urgent issue when the clinic is closed that cannot wait until the next business day, you can page your doctor at the number below.    Please note that while we do our best to be available for urgent issues outside of office hours, we are not available 24/7.    If you have an urgent issue and are unable to reach u[place] you may choose to seek medical care at your doctor's office, retail clinic, urgent care center, or emergency room.  If you have a medical emergency, please immediately call 911 or go to the emergency department.  Pager Numbers  - Dr. KNehemiah Massed [phone] - Dr. MLaurence Ferrari [phone] - Dr. SNicole Kindred [phone] In the event of inclement weather, please call our main line at [phone] an update on the status of any delays or closures.  Dermatology Medication Tips: Please keep the boxes that topical medications come in in order to help keep track of the instructions about where and how to use these. Pharmacies typically print the medication instructions only on the boxes and not directly on the medication tubes.   If your medication is too expensive, please contact our office  at [phone] option 4 or send [place] a message through [place].   We are unable to tell what your co-pay for medications will be in advance as this is different depending on your insurance coverage. However, we may be able to find a substitute medication at lower cost or fill out paperwork to get insurance to cover a needed medication.   If a prior authorization is required to get your medication covered by your insurance company, please allow [place] 1-2 business days to complete this process.  Drug prices often vary depending on where the prescription is filled and some pharmacies may offer cheaper prices.  The website www.goodrx.com contains coupons for medications through different pharmacies. The prices here do not account for what the cost may be with help from insurance (it may be cheaper with your insurance), but the website can give you the price if you did not use any insurance.  - You can print the associated coupon and take it with your prescription to the pharmacy.  - You may also stop by our office during regular business hours and pick up a  GoodRx coupon card.  - If you need your prescription sent electronically to a different pharmacy, notify our office through [employer] or by phone at [phone] option 4.     Si Usted Necesita Algo Despus de Su Visita  Tambin puede enviarnos un mensaje a travs de Pharmacist, community. Por lo general respondemos a los mensajes de MyChart en el transcurso de 1 a 2 das hbiles.  Para renovar recetas, por favor pida a su farmacia que se ponga en contacto con nuestra oficina. Harland Dingwall de fax es [place] [phone].  Si tiene un asunto urgente cuando la clnica est cerrada y que no puede esperar hasta el siguiente da hbil, puede llamar/localizar a su doctor(a) al nmero que aparece a continuacin.   Por favor, tenga en cuenta que aunque hacemos todo lo posible para estar disponibles para asuntos urgentes fuera del horario de [place], no estamos disponibles las 24 horas del da, los 7 das de la [place].   Si tiene un problema urgente y no puede comunicarse con nosotros, puede optar por buscar atencin mdica  en el consultorio de su doctor(a), en una clnica privada, en un centro de atencin urgente o en una sala de emergencias.  Si tiene Engineering geologist, por favor llame inmediatamente al 911 o vaya a la sala de emergencias.  Nmeros de bper  - Dr. Nehemiah Massed: [phone]  - Dra. Moye: [phone]  - Dra. Nicole Kindred: [phone]  En caso de inclemencias del [place], por favor llame a Johnsie Kindred principal al [phone] para una actualizacin sobre el [place] de cualquier retraso o cierre.  Consejos para la medicacin en dermatologa: Por favor, guarde las cajas en las que vienen los medicamentos de uso tpico para ayudarle a seguir las instrucciones sobre dnde y cmo usarlos. Las farmacias generalmente imprimen las instrucciones del medicamento slo en las cajas y no directamente en los tubos del [place].   Si su medicamento es muy caro, por favor, pngase en contacto con  Zigmund Daniel llamando al [phone] y presione la opcin 4 o envenos un mensaje a travs de Pharmacist, community.   No podemos decirle cul ser su copago por los medicamentos por adelantado ya que esto es diferente dependiendo de la cobertura de su seguro. Sin embargo, es posible que podamos encontrar un medicamento sustituto a Electrical engineer un formulario para que el seguro cubra el medicamento que se  considera necesario.   Si se requiere una autorizacin previa para que su compaa de seguros [place] su medicamento, por favor permtanos de 1 a 2 das hbiles para completar este proceso.  Los precios de los medicamentos varan con frecuencia dependiendo del Environmental consultant de dnde se surte la receta y alguna farmacias pueden ofrecer precios ms baratos.  El sitio web www.goodrx.com tiene cupones para medicamentos de Airline pilot. Los precios aqu no tienen en cuenta lo que podra costar con la ayuda del seguro (puede ser ms barato con su seguro), pero el sitio web puede darle el precio si no utiliz Research scientist (physical sciences).  - Puede imprimir el cupn correspondiente y llevarlo con su receta a la farmacia.  - Tambin puede pasar por nuestra oficina durante el horario de atencin regular y Charity fundraiser una tarjeta de cupones de GoodRx.  - Si necesita que su receta se enve electrnicamente a una farmacia diferente, informe a nuestra oficina a travs de MyChart de [place] o por telfono llamando al [phone] y presione la opcin 4.

## 2022-06-10 ENCOUNTER — Other Ambulatory Visit: Payer: Self-pay | Admitting: Family Medicine

## 2022-06-10 DIAGNOSIS — Z1231 Encounter for screening mammogram for malignant neoplasm of breast: Secondary | ICD-10-CM

## 2022-06-12 ENCOUNTER — Encounter: Payer: Self-pay | Admitting: Dermatology

## 2022-06-12 ENCOUNTER — Telehealth: Payer: Self-pay

## 2022-06-12 DIAGNOSIS — C44619 Basal cell carcinoma of skin of left upper limb, including shoulder: Secondary | ICD-10-CM

## 2022-06-12 NOTE — Telephone Encounter (Signed)
Discussed pathology results. Patient voiced understanding. Will send referral to Dr. Manley Mason at [facility], per patient preference.

## 2022-06-12 NOTE — Telephone Encounter (Signed)
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 Recommend Mohs surgery since this has already come back once to give best cure rate.   MAs please call with results and refer. Please let me know if they have any questions. Thank you!

## 2022-07-09 DIAGNOSIS — C44619 Basal cell carcinoma of skin of left upper limb, including shoulder: Secondary | ICD-10-CM | POA: Diagnosis not present

## 2022-07-22 DIAGNOSIS — E507 Other ocular manifestations of vitamin A deficiency: Secondary | ICD-10-CM | POA: Diagnosis not present

## 2022-07-22 DIAGNOSIS — E785 Hyperlipidemia, unspecified: Secondary | ICD-10-CM | POA: Diagnosis not present

## 2022-07-22 DIAGNOSIS — J309 Allergic rhinitis, unspecified: Secondary | ICD-10-CM | POA: Diagnosis not present

## 2022-07-22 DIAGNOSIS — Z85828 Personal history of other malignant neoplasm of skin: Secondary | ICD-10-CM | POA: Diagnosis not present

## 2022-07-22 DIAGNOSIS — Z96649 Presence of unspecified artificial hip joint: Secondary | ICD-10-CM | POA: Diagnosis not present

## 2022-08-06 ENCOUNTER — Ambulatory Visit
Admission: RE | Admit: 2022-08-06 | Discharge: 2022-08-06 | Disposition: A | Discharge status: Home / Self Care | Payer: Medicare HMO | Source: Ambulatory Visit | Attending: Family Medicine | Admitting: Family Medicine

## 2022-08-06 DIAGNOSIS — Z1231 Encounter for screening mammogram for malignant neoplasm of breast: Secondary | ICD-10-CM | POA: Diagnosis not present

## 2022-08-23 ENCOUNTER — Other Ambulatory Visit: Payer: Self-pay | Admitting: Family Medicine

## 2022-08-23 ENCOUNTER — Telehealth: Payer: Self-pay | Admitting: Family Medicine

## 2022-08-23 DIAGNOSIS — R17 Unspecified jaundice: Secondary | ICD-10-CM

## 2022-08-23 DIAGNOSIS — E559 Vitamin D deficiency, unspecified: Secondary | ICD-10-CM

## 2022-08-23 DIAGNOSIS — E782 Mixed hyperlipidemia: Secondary | ICD-10-CM

## 2022-08-23 DIAGNOSIS — R7309 Other abnormal glucose: Secondary | ICD-10-CM

## 2022-08-23 NOTE — Telephone Encounter (Signed)
Patient has a lab appointment 08/29/2022, there are no orders in.

## 2022-08-29 ENCOUNTER — Other Ambulatory Visit (INDEPENDENT_AMBULATORY_CARE_PROVIDER_SITE_OTHER): Payer: Medicare HMO

## 2022-08-29 DIAGNOSIS — R17 Unspecified jaundice: Secondary | ICD-10-CM

## 2022-08-29 DIAGNOSIS — E559 Vitamin D deficiency, unspecified: Secondary | ICD-10-CM

## 2022-08-29 DIAGNOSIS — R7309 Other abnormal glucose: Secondary | ICD-10-CM

## 2022-08-29 DIAGNOSIS — E782 Mixed hyperlipidemia: Secondary | ICD-10-CM

## 2022-08-29 LAB — COMPREHENSIVE METABOLIC PANEL
ALT: 14 U/L (ref 0–35)
AST: 15 U/L (ref 0–37)
Albumin: 4.2 g/dL (ref 3.5–5.2)
Alkaline Phosphatase: 41 U/L (ref 39–117)
BUN: 10 mg/dL (ref 6–23)
CO2: 29 mEq/L (ref 19–32)
Calcium: 9.7 mg/dL (ref 8.4–10.5)
Chloride: 103 mEq/L (ref 96–112)
Creatinine, Ser: 0.69 mg/dL (ref 0.40–1.20)
GFR: 85.45 mL/min (ref >60.00)
Glucose, Bld: 94 mg/dL (ref 70–99)
Potassium: 4.1 mEq/L (ref 3.5–5.1)
Sodium: 142 mEq/L (ref 135–145)
Total Bilirubin: 2 mg/dL — ABNORMAL HIGH (ref 0.2–1.2)
Total Protein: 6.6 g/dL (ref 6.0–8.3)

## 2022-08-29 LAB — LIPID PANEL
Cholesterol: 164 mg/dL (ref 0–200)
HDL: 79.6 mg/dL (ref >39.00)
LDL Cholesterol: 66 mg/dL (ref 0–99)
NonHDL: 84.6
Total CHOL/HDL Ratio: 2
Triglycerides: 95 mg/dL (ref 0.0–149.0)
VLDL: 19 mg/dL (ref 0.0–40.0)

## 2022-08-29 LAB — CBC WITH DIFFERENTIAL/PLATELET
Basophils Absolute: 0.1 10*3/uL (ref 0.0–0.1)
Basophils Relative: 1.3 % (ref 0.0–3.0)
Eosinophils Absolute: 0.2 10*3/uL (ref 0.0–0.7)
Eosinophils Relative: 4.7 % (ref 0.0–5.0)
HCT: 42.4 % (ref 36.0–46.0)
Hemoglobin: 14.4 g/dL (ref 12.0–15.0)
Lymphocytes Relative: 31.8 % (ref 12.0–46.0)
Lymphs Abs: 1.7 10*3/uL (ref 0.7–4.0)
MCHC: 34 g/dL (ref 30.0–36.0)
MCV: 91.5 fl (ref 78.0–100.0)
Monocytes Absolute: 0.5 10*3/uL (ref 0.1–1.0)
Monocytes Relative: 8.7 % (ref 3.0–12.0)
Neutro Abs: 2.8 10*3/uL (ref 1.4–7.7)
Neutrophils Relative %: 53.5 % (ref 43.0–77.0)
Platelets: 269 10*3/uL (ref 150.0–400.0)
RBC: 4.64 Mil/uL (ref 3.87–5.11)
RDW: 12.7 % (ref 11.5–15.5)
WBC: 5.3 10*3/uL (ref 4.0–10.5)

## 2022-08-29 LAB — VITAMIN D 25 HYDROXY (VIT D DEFICIENCY, FRACTURES): VITD: 60.3 ng/mL (ref 30.00–100.00)

## 2022-08-29 LAB — HEMOGLOBIN A1C: Hgb A1c MFr Bld: 5.7 % (ref 4.6–6.5)

## 2022-09-03 ENCOUNTER — Ambulatory Visit (INDEPENDENT_AMBULATORY_CARE_PROVIDER_SITE_OTHER): Payer: Medicare HMO | Admitting: Family Medicine

## 2022-09-03 ENCOUNTER — Encounter: Payer: Self-pay | Admitting: Family Medicine

## 2022-09-03 VITALS — BP 118/72 | HR 77 | Ht 61.0 in | Wt 124.0 lb

## 2022-09-03 DIAGNOSIS — H547 Unspecified visual loss: Secondary | ICD-10-CM | POA: Diagnosis not present

## 2022-09-03 DIAGNOSIS — E782 Mixed hyperlipidemia: Secondary | ICD-10-CM | POA: Diagnosis not present

## 2022-09-03 DIAGNOSIS — H538 Other visual disturbances: Secondary | ICD-10-CM

## 2022-09-03 NOTE — Progress Notes (Signed)
   SUBJECTIVE:   Chief Complaint  Patient presents with   Establish Care   HPI Presents to clinic to transfer care.  No acute concerns today.  Hyperlipidemia currently taking Lipitor 10 mg and tolerating well.  No myalgias or joint pain  Seasonal allergies Takes Flonase 2 sprays daily and Claritin 10 mg as needed.  History of motion sickness Has used scopolamine patches in the past for cruise medications.  Reports significantly helps with motion sickness  Cloudy vision Patient reports recent onset of intermittent cloudy vision.  Resolved after numerous attempts of blinking.  Was previously prescribed glasses however has not used them in quite some time.  Has not had eyes examined.  Denies any headaches, loss of vision, floaters, weakness, slurred speech, facial droop, numbness or tingling.  PERTINENT PMH / PSH: Lumbar radiculopathy Osteoarthritis Intertrigo Sensorineural hearing loss, bilaterally Hyperlipidemia   OBJECTIVE:  BP 118/72   Pulse 77   Ht 5\' 1"  (1.549 m)   Wt 124 lb (56.2 kg)   SpO2 95%   BMI 23.43 kg/m    Physical Exam Vitals reviewed.  Constitutional:      General: She is not in acute distress.    Appearance: She is not ill-appearing.  HENT:     Head: Normocephalic.     Nose: Nose normal.  Eyes:     Conjunctiva/sclera: Conjunctivae normal.  Neck:     Thyroid: No thyromegaly or thyroid tenderness.  Cardiovascular:     Rate and Rhythm: Normal rate and regular rhythm.     Heart sounds: Normal heart sounds.  Pulmonary:     Effort: Pulmonary effort is normal.     Breath sounds: Normal breath sounds.  Abdominal:     General: Abdomen is flat. Bowel sounds are normal.     Palpations: Abdomen is soft.  Musculoskeletal:        General: Normal range of motion.     Cervical back: Normal range of motion.  Neurological:     Mental Status: She is alert and oriented to person, place, and time. Mental status is at baseline.  Psychiatric:        Mood and  Affect: Mood normal.        Behavior: Behavior normal.        Thought Content: Thought content normal.        Judgment: Judgment normal.     ASSESSMENT/PLAN:  Visual problems Assessment & Plan: Chronic. Low suspicion for TIA/CVA given no focal deficits on neurologic exam No obvious cataracts visualized.  EOMs normal, visual acuity normal. Refer to ophthalmology Can trial refresh ophthalmic drops for dry eyes  Orders: -     Ambulatory referral to Ophthalmology  Mixed hyperlipidemia Assessment & Plan: Chronic.  On statin therapy and tolerating well.  No myalgias or joint pain Continue Lipitor 10 mg daily Self discontinued ASA 81 mg   HCM Medicare annual wellness due Mammogram up-to-date.  Due 04/25 Cologuard up-to-date.  Due 06/09/2023 DEXA completed.  T-score -2.0.  Continue calcium and vitamin D supplements Tetanus up-to-date Pneumonia up-to-date.  Received 2 vaccines 13/23.  Could consider pneumonia 20. Shingles completed  PDMP reviewed  Return in about 6 months (around 03/06/2023) for PCP.  Dana Allan, MD

## 2022-09-03 NOTE — Patient Instructions (Addendum)
It was a pleasure meeting you today. Thank you for allowing me to take part in your health care.  Our goals for today as we discussed include:   Referral sent Call to schedule [facility] Address: [address] [place], [place] [address] Phone: [phone]  If you have any questions or concerns, please do not hesitate to call the office at [phone].  I look forward to our next visit and until then take care and stay safe.  Regards,   Dana Allan, MD   [facility]

## 2022-09-05 ENCOUNTER — Telehealth: Payer: Self-pay | Admitting: Family Medicine

## 2022-09-05 NOTE — Telephone Encounter (Signed)
Contacted [facility] [employer] to schedule their annual wellness visit. Appointment made for 09/18/2022.  Verlee Rossetti; Care Guide Ambulatory Clinical Support [facility] Direct Dial: [phone]

## 2022-09-15 DIAGNOSIS — H547 Unspecified visual loss: Secondary | ICD-10-CM | POA: Insufficient documentation

## 2022-09-15 NOTE — Assessment & Plan Note (Addendum)
Chronic. Low suspicion for TIA/CVA given no focal deficits on neurologic exam No obvious cataracts visualized.  EOMs normal, visual acuity normal. Refer to ophthalmology Can trial refresh ophthalmic drops for dry eyes

## 2022-09-15 NOTE — Assessment & Plan Note (Signed)
Chronic.  On statin therapy and tolerating well.  No myalgias or joint pain Continue Lipitor 10 mg daily Self discontinued ASA 81 mg

## 2022-09-18 IMAGING — MG MM DIGITAL SCREENING BILAT W/ TOMO AND CAD
8 series · 9 of 24 positions shown · non-contrast
Comparison: Previous exam(s).

CLINICAL DATA: Screening.

EXAM:
DIGITAL SCREENING BILATERAL MAMMOGRAM WITH TOMOSYNTHESIS AND CAD
TECHNIQUE: Bilateral screening digital craniocaudal and mediolateral oblique
mammograms were obtained. Bilateral screening digital breast
tomosynthesis was performed. The images were evaluated with
computer-aided detection.

[L MLO synth-2D]
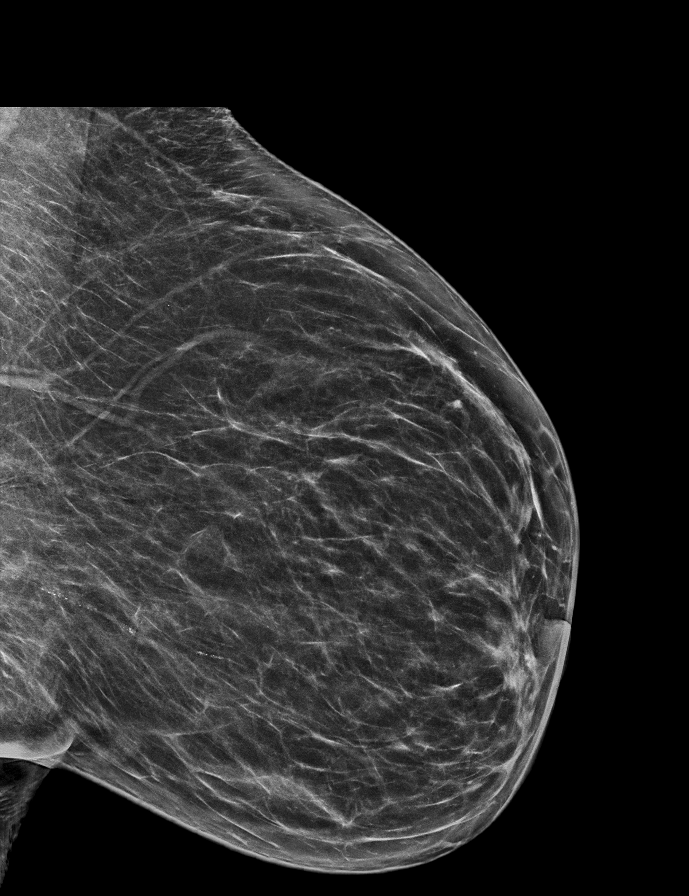

[R MLO synth-2D]
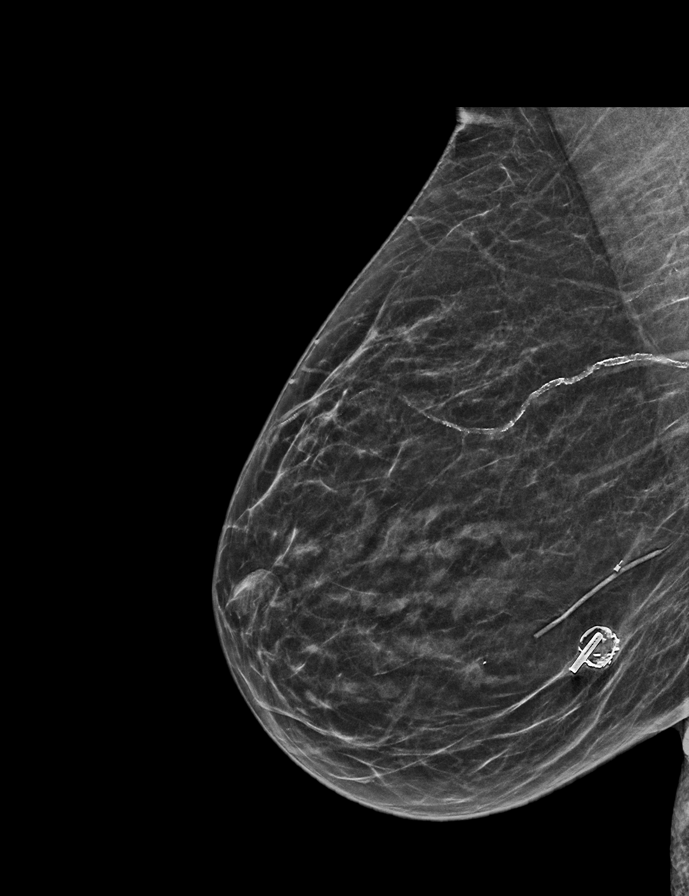

[R CC synth-2D]
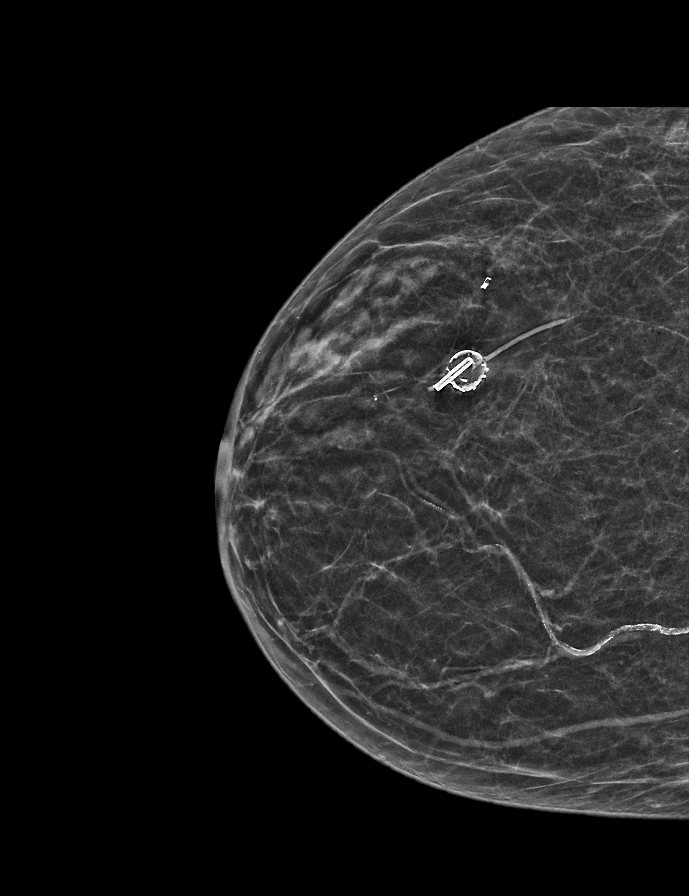

[L CC synth-2D]
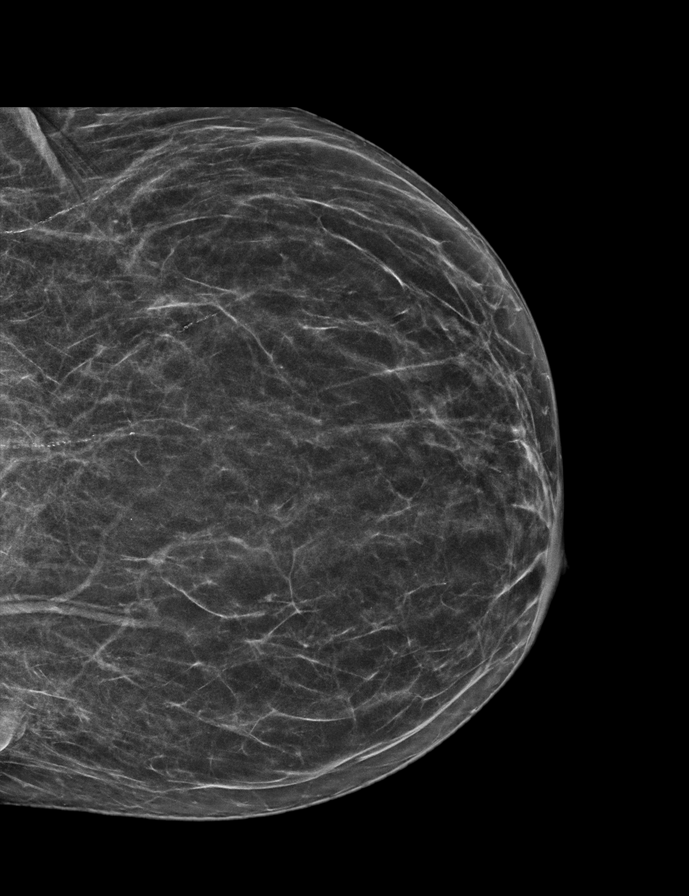

[L MLO tomo · 2 of 59 frames shown]
[frame 20/59]
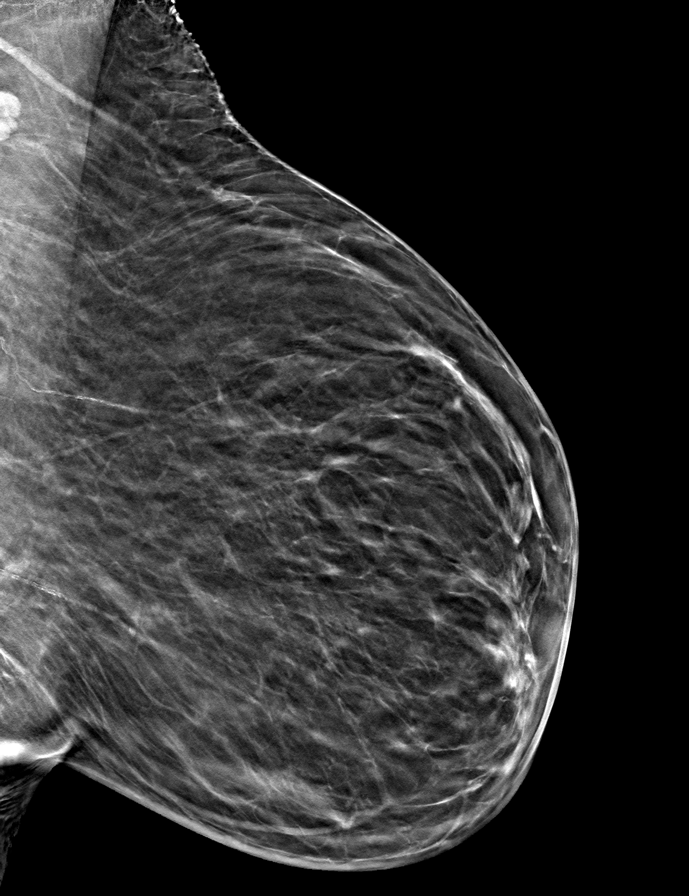
[frame 30/59]
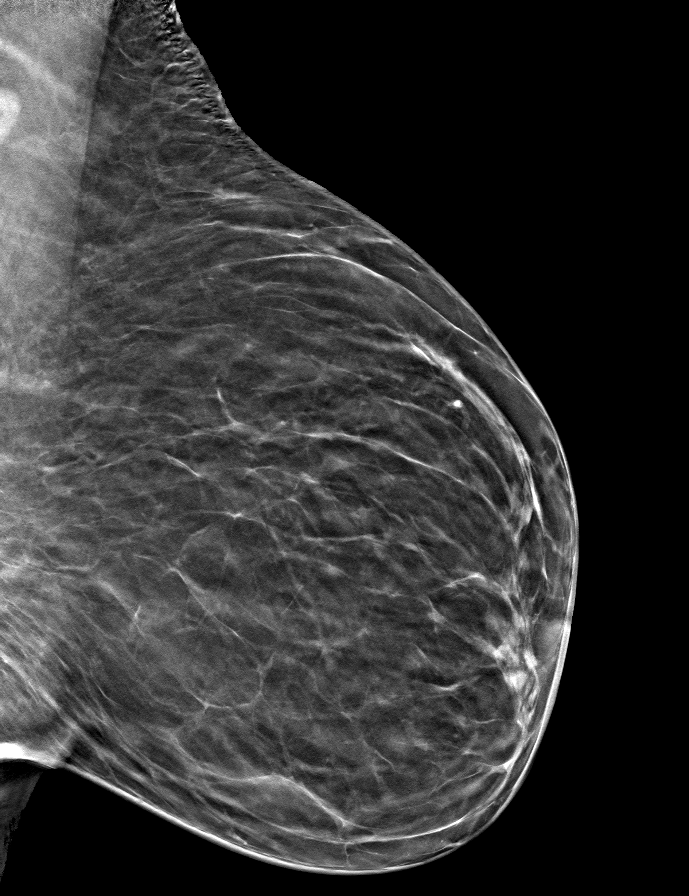

[R MLO tomo · tomo slice 23/46.0]
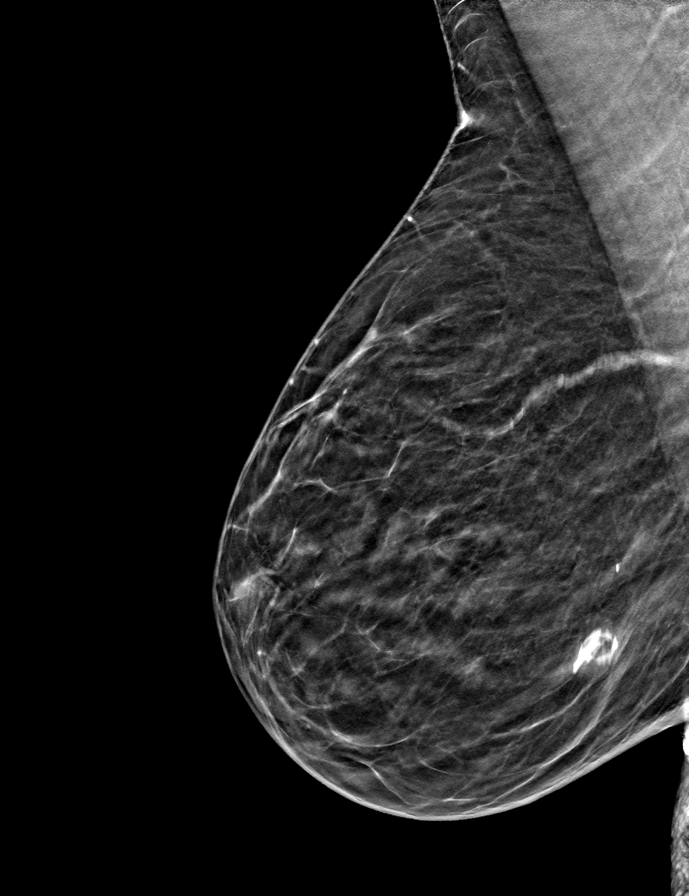

[R CC tomo · tomo slice 21/40.0]
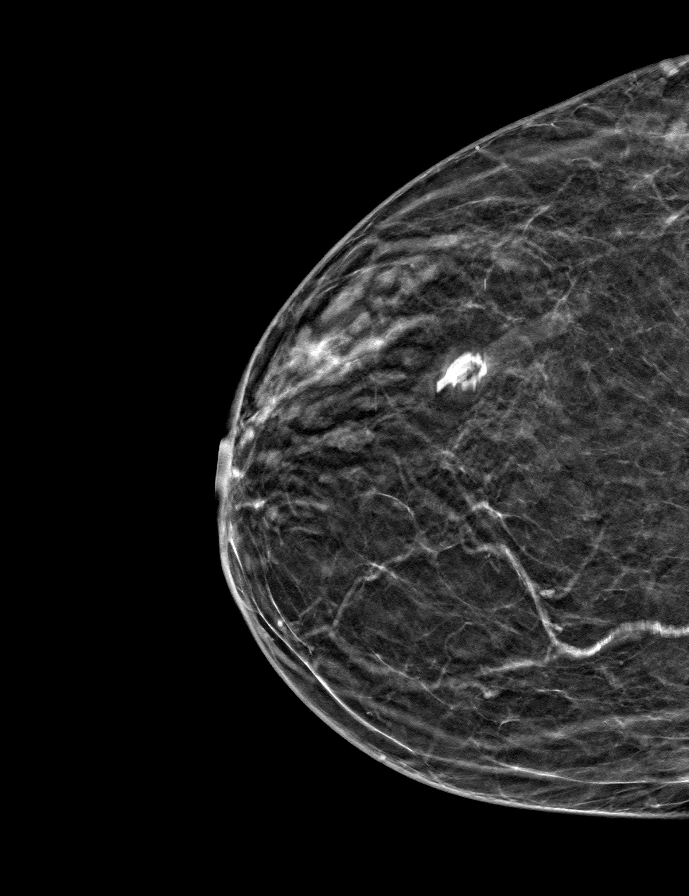

[L CC tomo · tomo slice 27/52.0]
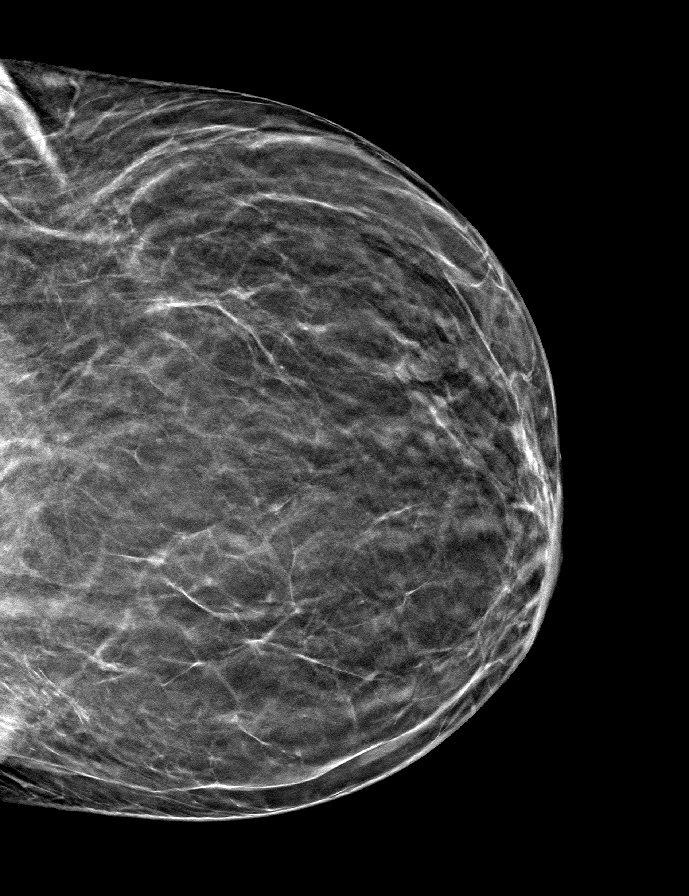

[9 of 24 positions shown; findings below may reference images not displayed]

ACR Breast Density Category b: There are scattered areas of
fibroglandular density.
FINDINGS: There are no findings suspicious for malignancy.
IMPRESSION: No mammographic evidence of malignancy. A result letter of this
screening mammogram will be mailed directly to the patient.

RECOMMENDATION:
Screening mammogram in one year. (Code:51-O-LD2)

BI-RADS CATEGORY  1: Negative.

## 2022-09-20 ENCOUNTER — Ambulatory Visit: Payer: Medicare HMO | Admitting: Nurse Practitioner

## 2022-09-24 ENCOUNTER — Ambulatory Visit (INDEPENDENT_AMBULATORY_CARE_PROVIDER_SITE_OTHER): Payer: Medicare HMO

## 2022-09-24 VITALS — Ht 61.0 in | Wt 124.0 lb

## 2022-09-24 DIAGNOSIS — Z0000 Encounter for general adult medical examination without abnormal findings: Secondary | ICD-10-CM | POA: Diagnosis not present

## 2022-09-24 NOTE — Progress Notes (Signed)
I connected with  Milus Glazier on 09/24/22 by a audio enabled telemedicine application and verified that I am speaking with the correct person using two identifiers.  Patient Location: Home  Provider Location: Office/Clinic  I discussed the limitations of evaluation and management by telemedicine. The patient expressed understanding and agreed to proceed.  Subjective:   Andrea Spencer is a 77 y.o. female who presents for Medicare Annual (Subsequent) preventive examination.  Review of Systems     Cardiac Risk Factors include: advanced age (>44men, >42 women);dyslipidemia     Objective:    There were no vitals filed for this visit. There is no height or weight on file to calculate BMI.     09/24/2022   11:33 AM 09/17/2021    7:54 AM 06/28/2021   10:57 AM 06/26/2020   11:13 AM 04/01/2019   10:45 AM 03/23/2018    8:54 AM 01/18/2016   10:34 AM  Advanced Directives  Does Patient Have a Medical Advance Directive? No No No Yes No Yes No  Type of Aeronautical engineer of [place];Living will  Healthcare Power of [place];Living will   Does patient want to make changes to medical advance directive?    No - Patient declined  No - Patient declined   Copy of Healthcare Power of Attorney in Chart?    No - copy requested  No - copy requested   Would patient like information on creating a medical advance directive? No - Patient declined No - Patient declined No - Patient declined  No - Patient declined  No - patient declined information    Current Medications (verified) Outpatient Encounter Medications as of 09/24/2022  Medication Sig   Acetaminophen (TYLENOL ARTHRITIS PAIN PO) Take by mouth.   Apoaequorin (PREVAGEN EXTRA STRENGTH PO) Take by mouth.   atorvastatin (LIPITOR) 10 MG tablet Take 1 tablet (10 mg total) by mouth daily at 6 PM.   Biotin 10 MG CAPS Take 1,000 mg by mouth daily.   Calcium Carb-Cholecalciferol ([employer] CALCIUM 600 & VITAMIN D3 PO) Take by mouth  2 (two) times daily after a meal.   fluticasone (FLONASE) 50 MCG/ACT nasal spray Place 2 sprays into both nostrils daily.   ketoconazole (NIZORAL) 2 % shampoo APPLY THREE TIMES PER WEEK, MASSAGE INTO SCALP AND LEAVE IN FOR 10 MINUTES BEFORE RINSING OUT   Multiple Vitamin (MULTIVITAMIN WITH MINERALS) TABS tablet Take 1 tablet by mouth daily.   sodium chloride (OCEAN) 0.65 % SOLN nasal spray Place 2 sprays into both nostrils daily as needed for congestion.   triamcinolone ointment (KENALOG) 0.1 % Apply twice daily to affected areas up to 2 weeks as needed for itch. Avoid applying to face, groin, and axilla. Use as directed. Risk of skin atrophy with long-term use reviewed.   TURMERIC PO Take by mouth daily.   vitamin C (ASCORBIC ACID) 250 MG tablet Take 500 mg by mouth daily. Taking two tablets daily   aspirin 81 MG tablet Take 81 mg by mouth daily. (Patient not taking: Reported on 09/24/2022)   cholecalciferol (VITAMIN D) 1000 UNITS tablet Take 1,000 Units by mouth daily. (Patient not taking: Reported on 09/24/2022)   Krill Oil 1000 MG CAPS Take 3 capsules by mouth daily. (Patient not taking: Reported on 09/24/2022)   loratadine (CLARITIN) 10 MG tablet Take 1 tablet (10 mg total) by mouth at bedtime as needed for allergies. (Patient not taking: Reported on 09/24/2022)   valACYclovir (VALTREX) 500 MG tablet Take one  tablet twice daily for 10 days, begin 24-36 hours prior to appointment for chemical peel. (Patient not taking: Reported on 09/24/2022)   No facility-administered encounter medications on file as of 09/24/2022.    Allergies (verified) Aspartame and phenylalanine, Meloxicam, Other, and Paxlovid [nirmatrelvir-ritonavir]   History: Past Medical History:  Diagnosis Date   Arthritis    Basal cell carcinoma 12/10/2017   Left alar crease. Micronodular pattern   Basal cell carcinoma 12/10/2017   Left upper back. Nodular.    Basal cell carcinoma 12/10/2017   Right medial mid back. Superficial.    Basal cell carcinoma 12/10/2017   Right lateral mid back. Nodular   Basal cell carcinoma 12/24/2018   Right mid back. Nodular   Basal cell carcinoma 12/24/2018   Left anterior shoulder. Nodular   BCC (basal cell carcinoma of skin)    07/02/21 left lower eyelid mohs Dr Cheryll Dessert   [facility] (basal cell carcinoma) 05/23/2021   left lower eyelid, Mohs completed 07/02/2021   BCC (basal cell carcinoma) 06/06/2022   left anterior shoulder, Mohs 07/09/22   Breast mass    right   Cancer (HCC)    skin cancer SCC, BCC multiple    COVID-19    11/22/20   Headache    History of bronchitis    History of pneumonia    Hyperlipidemia    Past Surgical History:  Procedure Laterality Date   BREAST LUMPECTOMY Right 2016   right 2016 negative malignancy fibrocystic changes    COLONOSCOPY     COLONOSCOPY WITH PROPOFOL N/A 09/17/2021   Procedure: COLONOSCOPY WITH PROPOFOL;  Surgeon: Jaynie Collins, DO;  Location: [facility] ENDOSCOPY;  Service: Gastroenterology;  Laterality: N/A;   DILATION AND CURETTAGE OF UTERUS     JOINT REPLACEMENT     left    MOHS SURGERY     x2 face    MOHS SURGERY     L alar 02/04/18    RADIOACTIVE SEED GUIDED EXCISIONAL BREAST BIOPSY Right 04/06/2015   Procedure: RADIOACTIVE SEED GUIDED EXCISIONAL RIGHT BREAST BIOPSY;  Surgeon: Almond Lint, MD;  Location: [facility] OR;  Service: General;  Laterality: Right;   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   Procedure: TOTAL HIP ARTHROPLASTY ANTERIOR APPROACH;  Surgeon: Sheral Apley, MD;  Location: [facility] OR;  Service: Orthopedics;  Laterality: Left;   WISDOM TOOTH EXTRACTION     Family History  Problem Relation Age of Onset   Stroke Mother    Alzheimer's disease Father    Social History   Socioeconomic History   Marital status: Widowed    Spouse name: Not on file   Number of children: Not on file   Years of education: Not on file   Highest education level: Not on file  Occupational History   Not on file  Tobacco Use   Smoking  status: Never   Smokeless tobacco: Never  Vaping Use   Vaping Use: Never used  Substance and Sexual Activity   Alcohol use: Yes    Comment: 2-3 drinks/week,none last 24rs   Drug use: No   Sexual activity: Not Currently  Other Topics Concern   Not on file  Social History Narrative   Widowed husband died 10/31/2018    1 son lives in [place]; also has step kids (son and daughter though deceased husband [place])   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    Owns guns, wear seat belt    Safe in relationship    1 dog Viszla named [place]  Social Determinants of Health   Financial Resource Strain: Low Risk  (09/24/2022)   Overall Financial Resource Strain (CARDIA)    Difficulty of Paying Living Expenses: Not hard at all  Food Insecurity: No Food Insecurity (09/24/2022)   Hunger Vital Sign    Worried About Running Out of Food in the Last Year: Never true    Ran Out of Food in the Last Year: Never true  Transportation Needs: No Transportation Needs (09/24/2022)   PRAPARE - Administrator, Civil Service (Medical): No    Lack of Transportation (Non-Medical): No  Physical Activity: Sufficiently Active (09/24/2022)   Exercise Vital Sign    Days of Exercise per Week: 2 days    Minutes of Exercise per Session: 120 min  Stress: No Stress Concern Present (09/24/2022)   [employer] of Occupational Health - Occupational Stress Questionnaire    Feeling of Stress : Only a little  Social Connections: Moderately Isolated (09/24/2022)   Social Connection and Isolation Panel [NHANES]    Frequency of Communication with Friends and Family: More than three times a week    Frequency of Social Gatherings with Friends and Family: More than three times a week    Attends Religious Services: More than 4 times per year    Active Member of [employer] or Organizations: No    Attends Banker Meetings: Never    Marital Status: Widowed    Tobacco Counseling Counseling given: Not  Answered   Clinical Intake:  Pre-visit preparation completed: Yes  Pain : No/denies pain     Nutritional Risks: None Diabetes: No  How often do you need to have someone help you when you read instructions, pamphlets, or other written materials from your doctor or pharmacy?: 1 - Never  Diabetic?no  Interpreter Needed?: No  Information entered by :: Kennedy Bucker, LPN   Activities of Daily Living    09/24/2022   11:34 AM 09/23/2022    1:46 PM  In your present state of health, do you have any difficulty performing the following activities:  Hearing? 0 0  Vision? 1 1  Difficulty concentrating or making decisions? 0 0  Walking or climbing stairs? 0 0  Dressing or bathing? 0 0  Doing errands, shopping? 0 0  Preparing Food and eating ? N N  Using the Toilet? N N  In the past six months, have you accidently leaked urine? N N  Do you have problems with loss of bowel control? N N  Managing your Medications? N N  Managing your Finances? N N  Housekeeping or managing your Housekeeping? N N    Patient Care Team: Dana Allan, MD as PCP - General (Family Medicine)  Indicate any recent Medical Services you may have received from other than [facility] providers in the past year (date may be approximate).     Assessment:   This is a routine wellness examination for [place].  Hearing/Vision screen Hearing Screening - Comments:: No aids Vision Screening - Comments:: Readers-   Dietary issues and exercise activities discussed: Current Exercise Habits: Home exercise routine, Type of exercise: Other - see comments (gym classes), Time (Minutes): 60, Frequency (Times/Week): 2, Weekly Exercise (Minutes/Week): 120, Intensity: Mild   Goals Addressed             This Visit's Progress    DIET - EAT MORE FRUITS AND VEGETABLES         Depression Screen    09/24/2022   11:32 AM 09/03/2022  9:08 AM 04/05/2022    8:16 AM 02/05/2022    8:13 AM 10/04/2021   10:41 AM 06/28/2021   10:56  AM 04/03/2021   11:44 AM  PHQ 2/9 Scores  PHQ - 2 Score 0 0 0 0 0 0 0  PHQ- 9 Score 0 0         Fall Risk    09/24/2022   11:34 AM 09/23/2022    1:46 PM 09/03/2022    9:08 AM 04/05/2022    8:16 AM 02/05/2022    8:13 AM  Fall Risk   Falls in the past year? 0 0 0 0 0  Number falls in past yr: 0 0  0 0  Injury with Fall? 0 0  0 0  Risk for fall due to : No Fall Risks  No Fall Risks No Fall Risks No Fall Risks  Follow up Falls prevention discussed;Falls evaluation completed   Falls evaluation completed Falls evaluation completed    FALL RISK PREVENTION PERTAINING TO THE HOME:  Any stairs in or around the home? Yes  If so, are there any without handrails? No  Home free of loose throw rugs in walkways, pet beds, electrical cords, etc? Yes  Adequate lighting in your home to reduce risk of falls? Yes   ASSISTIVE DEVICES UTILIZED TO PREVENT FALLS:  Life alert? No  Use of a cane, walker or w/c? No  Grab bars in the bathroom? No  Shower chair or bench in shower? No  Elevated toilet seat or a handicapped toilet? No    Cognitive Function:    03/23/2018    9:21 AM  MMSE - Mini Mental State Exam  Orientation to time 5  Orientation to Place 5  Registration 3  Attention/ Calculation 5  Recall 3  Language- name 2 objects 2  Language- repeat 1  Language- follow 3 step command 3  Language- read & follow direction 1  Write a sentence 1  Copy design 1  Total score 30        09/24/2022   11:38 AM 04/01/2019   10:48 AM  6CIT Screen  What Year? 0 points 0 points  What month? 0 points 0 points  What time? 0 points 0 points  Count back from 20 0 points 0 points  Months in reverse 0 points 0 points  Repeat phrase 0 points 0 points  Total Score 0 points 0 points    Immunizations Immunization History  Administered Date(s) Administered   COVID-19, mRNA, vaccine(Comirnaty)12 years and older 02/13/2022   Fluad Quad(high Dose 65+) 12/29/2018, 01/10/2022   Influenza, High Dose  Seasonal PF 12/26/2017, 01/08/2021   Influenza-Unspecified 12/17/2019, 03/06/2022   PFIZER Comirnaty(Gray Top)Covid-19 Tri-Sucrose Vaccine 08/16/2020   PFIZER(Purple Top)SARS-COV-2 Vaccination 05/24/2019, 06/14/2019, 01/27/2020   Pfizer Covid-19 Vaccine Bivalent Booster 85yrs & up 02/22/2021   Pneumococcal Conjugate-13 06/18/2013   Pneumococcal Polysaccharide-23 12/26/2017   Tdap 12/31/2018   Zoster Recombinat (Shingrix) 12/26/2017, 05/15/2018    TDAP status: Up to date  Flu Vaccine status: Up to date  Pneumococcal vaccine status: Up to date  Covid-19 vaccine status: Completed vaccines  Qualifies for Shingles Vaccine? Yes   Zostavax completed No   Shingrix Completed?: Yes  Screening Tests Health Maintenance  Topic Date Due   COVID-19 Vaccine (7 - 2023-24 season) 04/10/2022   INFLUENZA VACCINE  11/28/2022   Fecal DNA (Cologuard)  06/09/2023   MAMMOGRAM  08/06/2023   Medicare Annual Wellness (AWV)  09/24/2023   DTaP/Tdap/Td (2 - Td or  Tdap) 12/30/2028   Pneumonia Vaccine 63+ Years old  Completed   DEXA SCAN  Completed   Hepatitis C Screening  Completed   Zoster Vaccines- Shingrix  Completed   HPV VACCINES  Aged Out    Health Maintenance  Health Maintenance Due  Topic Date Due   COVID-19 Vaccine (7 - 2023-24 season) 04/10/2022    Colorectal cancer screening: Type of screening: Cologuard. Completed 06/08/20. Repeat every 3 years  Mammogram status: Completed 08/06/22. Repeat every year  Bone Density status: Completed 05/28/18. Results reflect: Bone density results: OSTEOPENIA. Repeat every 5 years.  Lung Cancer Screening: (Low Dose CT Chest recommended if Age 96-80 years, 30 pack-year currently smoking OR have quit w/in 15years.) does not qualify.    Additional Screening:  Hepatitis C Screening: does qualify; Completed 03/23/18  Vision Screening: Recommended annual ophthalmology exams for early detection of glaucoma and other disorders of the eye. Is the patient up  to date with their annual eye exam?  No  Who is the provider or what is the name of the office in which the patient attends annual eye exams? No one If pt is not established with a provider, would they like to be referred to a provider to establish care? No .   Dental Screening: Recommended annual dental exams for proper oral hygiene  Community Resource Referral / Chronic Care Management: CRR required this visit?  No   CCM required this visit?  No      Plan:     I have personally reviewed and noted the following in the patient's chart:   Medical and social history Use of alcohol, tobacco or illicit drugs  Current medications and supplements including opioid prescriptions. Patient is not currently taking opioid prescriptions. Functional ability and status Nutritional status Physical activity Advanced directives List of other physicians Hospitalizations, surgeries, and ER visits in previous 12 months Vitals Screenings to include cognitive, depression, and falls Referrals and appointments  In addition, I have reviewed and discussed with patient certain preventive protocols, quality metrics, and best practice recommendations. A written personalized care plan for preventive services as well as general preventive health recommendations were provided to patient.     Hal Hope, LPN   [id number]/61/0960   Nurse Notes: none

## 2022-09-24 NOTE — Patient Instructions (Signed)
Andrea Spencer , Thank you for taking time to come for your Medicare Wellness Visit. I appreciate your ongoing commitment to your health goals. Please review the following plan we discussed and let me know if I can assist you in the future.   These are the goals we discussed:  Goals      DIET - EAT MORE FRUITS AND VEGETABLES     Maintain Healthy Lifestyle     Stay active Healthy diet        This is a list of the screening recommended for you and due dates:  Health Maintenance  Topic Date Due   COVID-19 Vaccine (7 - 2023-24 season) 04/10/2022   Flu Shot  11/28/2022   Cologuard (Stool DNA test)  06/09/2023   Mammogram  08/06/2023   Medicare Annual Wellness Visit  09/24/2023   DTaP/Tdap/Td vaccine (2 - Td or Tdap) 12/30/2028   Pneumonia Vaccine  Completed   DEXA scan (bone density measurement)  Completed   Hepatitis C Screening  Completed   Zoster (Shingles) Vaccine  Completed   HPV Vaccine  Aged Out    Advanced directives: no   Conditions/risks identified:  none  Next appointment: Follow up in one year for your annual wellness visit 09/26/23 @ 9:15 am by phone   Preventive Care 65 Years and Older, Female Preventive care refers to lifestyle choices and visits with your health care provider that can promote health and wellness. What does preventive care include? A yearly physical exam. This is also called an annual well check. Dental exams once or twice a year. Routine eye exams. Ask your health care provider how often you should have your eyes checked. Personal lifestyle choices, including: Daily care of your teeth and gums. Regular physical activity. Eating a healthy diet. Avoiding tobacco and drug use. Limiting alcohol use. Practicing safe sex. Taking low-dose aspirin every day. Taking vitamin and mineral supplements as recommended by your health care provider. What happens during an annual well check? The services and screenings done by your health care provider  during your annual well check will depend on your age, overall health, lifestyle risk factors, and family history of disease. Counseling  Your health care provider may ask you questions about your: Alcohol use. Tobacco use. Drug use. Emotional well-being. Home and relationship well-being. Sexual activity. Eating habits. History of falls. Memory and ability to understand (cognition). Work and work Astronomer. Reproductive health. Screening  You may have the following tests or measurements: Height, weight, and BMI. Blood pressure. Lipid and cholesterol levels. These may be checked every 5 years, or more frequently if you are over 91 years old. Skin check. Lung cancer screening. You may have this screening every year starting at age 45 if you have a 30-pack-year history of smoking and currently smoke or have quit within the past 15 years. Fecal occult blood test (FOBT) of the stool. You may have this test every year starting at age 5. Flexible sigmoidoscopy or colonoscopy. You may have a sigmoidoscopy every 5 years or a colonoscopy every 10 years starting at age 69. Hepatitis C blood test. Hepatitis B blood test. Sexually transmitted disease (STD) testing. Diabetes screening. This is done by checking your blood sugar (glucose) after you have not eaten for a while (fasting). You may have this done every 1-3 years. Bone density scan. This is done to screen for osteoporosis. You may have this done starting at age 35. Mammogram. This may be done every 1-2 years. Talk to your health  care provider about how often you should have regular mammograms. Talk with your health care provider about your test results, treatment options, and if necessary, the need for more tests. Vaccines  Your health care provider may recommend certain vaccines, such as: Influenza vaccine. This is recommended every year. Tetanus, diphtheria, and acellular pertussis (Tdap, Td) vaccine. You may need a Td booster every  10 years. Zoster vaccine. You may need this after age 39. Pneumococcal 13-valent conjugate (PCV13) vaccine. One dose is recommended after age 17. Pneumococcal polysaccharide (PPSV23) vaccine. One dose is recommended after age 29. Talk to your health care provider about which screenings and vaccines you need and how often you need them. This information is not intended to replace advice given to you by your health care provider. Make sure you discuss any questions you have with your health care provider. Document Released: 05/12/2015 Document Revised: 01/03/2016 Document Reviewed: 02/14/2015 Elsevier Interactive Patient Education  2017 [employer].  Fall Prevention in the Home Falls can cause injuries. They can happen to people of all ages. There are many things you can do to make your home safe and to help prevent falls. What can I do on the outside of my home? Regularly fix the edges of walkways and driveways and fix any cracks. Remove anything that might make you trip as you walk through a door, such as a raised step or threshold. Trim any bushes or trees on the path to your home. Use bright outdoor lighting. Clear any walking paths of anything that might make someone trip, such as rocks or tools. Regularly check to see if handrails are loose or broken. Make sure that both sides of any steps have handrails. Any raised decks and porches should have guardrails on the edges. Have any leaves, snow, or ice cleared regularly. Use sand or salt on walking paths during winter. Clean up any spills in your garage right away. This includes oil or grease spills. What can I do in the bathroom? Use night lights. Install grab bars by the toilet and in the tub and shower. Do not use towel bars as grab bars. Use non-skid mats or decals in the tub or shower. If you need to sit down in the shower, use a plastic, non-slip stool. Keep the floor dry. Clean up any water that spills on the floor as soon as it  happens. Remove soap buildup in the tub or shower regularly. Attach bath mats securely with double-sided non-slip rug tape. Do not have throw rugs and other things on the floor that can make you trip. What can I do in the bedroom? Use night lights. Make sure that you have a light by your bed that is easy to reach. Do not use any sheets or blankets that are too big for your bed. They should not hang down onto the floor. Have a firm chair that has side arms. You can use this for support while you get dressed. Do not have throw rugs and other things on the floor that can make you trip. What can I do in the kitchen? Clean up any spills right away. Avoid walking on wet floors. Keep items that you use a lot in easy-to-reach places. If you need to reach something above you, use a strong step stool that has a grab bar. Keep electrical cords out of the way. Do not use floor polish or wax that makes floors slippery. If you must use wax, use non-skid floor wax. Do not have throw  rugs and other things on the floor that can make you trip. What can I do with my stairs? Do not leave any items on the stairs. Make sure that there are handrails on both sides of the stairs and use them. Fix handrails that are broken or loose. Make sure that handrails are as long as the stairways. Check any carpeting to make sure that it is firmly attached to the stairs. Fix any carpet that is loose or worn. Avoid having throw rugs at the top or bottom of the stairs. If you do have throw rugs, attach them to the floor with carpet tape. Make sure that you have a light switch at the top of the stairs and the bottom of the stairs. If you do not have them, ask someone to add them for you. What else can I do to help prevent falls? Wear shoes that: Do not have high heels. Have rubber bottoms. Are comfortable and fit you well. Are closed at the toe. Do not wear sandals. If you use a stepladder: Make sure that it is fully opened.  Do not climb a closed stepladder. Make sure that both sides of the stepladder are locked into place. Ask someone to hold it for you, if possible. Clearly mark and make sure that you can see: Any grab bars or handrails. First and last steps. Where the edge of each step is. Use tools that help you move around (mobility aids) if they are needed. These include: Canes. Walkers. Scooters. Crutches. Turn on the lights when you go into a dark area. Replace any light bulbs as soon as they burn out. Set up your furniture so you have a clear path. Avoid moving your furniture around. If any of your floors are uneven, fix them. If there are any pets around you, be aware of where they are. Review your medicines with your doctor. Some medicines can make you feel dizzy. This can increase your chance of falling. Ask your doctor what other things that you can do to help prevent falls. This information is not intended to replace advice given to you by your health care provider. Make sure you discuss any questions you have with your health care provider. Document Released: 02/09/2009 Document Revised: 09/21/2015 Document Reviewed: 05/20/2014 Elsevier Interactive Patient Education  2017 [employer].

## 2022-10-02 DIAGNOSIS — H52223 Regular astigmatism, bilateral: Secondary | ICD-10-CM | POA: Diagnosis not present

## 2022-10-02 DIAGNOSIS — D3131 Benign neoplasm of right choroid: Secondary | ICD-10-CM | POA: Diagnosis not present

## 2022-10-02 DIAGNOSIS — H2513 Age-related nuclear cataract, bilateral: Secondary | ICD-10-CM | POA: Diagnosis not present

## 2022-10-02 DIAGNOSIS — H524 Presbyopia: Secondary | ICD-10-CM | POA: Diagnosis not present

## 2022-10-02 DIAGNOSIS — H04123 Dry eye syndrome of bilateral lacrimal glands: Secondary | ICD-10-CM | POA: Diagnosis not present

## 2022-10-28 ENCOUNTER — Other Ambulatory Visit: Payer: Self-pay | Admitting: Dermatology

## 2022-10-28 DIAGNOSIS — L219 Seborrheic dermatitis, unspecified: Secondary | ICD-10-CM

## 2022-12-05 ENCOUNTER — Encounter: Payer: PPO | Admitting: Dermatology

## 2022-12-27 ENCOUNTER — Telehealth: Payer: Self-pay | Admitting: Family Medicine

## 2022-12-27 DIAGNOSIS — H6993 Unspecified Eustachian tube disorder, bilateral: Secondary | ICD-10-CM

## 2022-12-27 MED ORDER — FLUTICASONE PROPIONATE 50 MCG/ACT NA SUSP
2.0000 | Freq: Every day | NASAL | 11 refills | Status: DC
Start: 2022-12-27 — End: 2024-02-02

## 2022-12-27 NOTE — Telephone Encounter (Signed)
Prescription Request  12/27/2022  LOV: 09/03/2022  What is the name of the medication or equipment? fluticasone   Have you contacted your pharmacy to request a refill? Yes   Which pharmacy would you like this sent to? [facility]  Patient notified that their request is being sent to the clinical staff for review and that they should receive a response within 2 business days.   Please advise at [facility] [phone]

## 2022-12-27 NOTE — Telephone Encounter (Signed)
Medication sent into pharmacy  

## 2022-12-31 DIAGNOSIS — L570 Actinic keratosis: Secondary | ICD-10-CM | POA: Diagnosis not present

## 2022-12-31 DIAGNOSIS — L821 Other seborrheic keratosis: Secondary | ICD-10-CM | POA: Diagnosis not present

## 2022-12-31 DIAGNOSIS — L814 Other melanin hyperpigmentation: Secondary | ICD-10-CM | POA: Diagnosis not present

## 2022-12-31 DIAGNOSIS — D492 Neoplasm of unspecified behavior of bone, soft tissue, and skin: Secondary | ICD-10-CM | POA: Diagnosis not present

## 2022-12-31 DIAGNOSIS — L905 Scar conditions and fibrosis of skin: Secondary | ICD-10-CM | POA: Diagnosis not present

## 2022-12-31 DIAGNOSIS — D225 Melanocytic nevi of trunk: Secondary | ICD-10-CM | POA: Diagnosis not present

## 2023-01-06 ENCOUNTER — Encounter: Payer: PPO | Admitting: Dermatology

## 2023-01-21 DIAGNOSIS — H9193 Unspecified hearing loss, bilateral: Secondary | ICD-10-CM | POA: Diagnosis not present

## 2023-01-21 DIAGNOSIS — H903 Sensorineural hearing loss, bilateral: Secondary | ICD-10-CM | POA: Diagnosis not present

## 2023-01-21 DIAGNOSIS — H9313 Tinnitus, bilateral: Secondary | ICD-10-CM | POA: Diagnosis not present

## 2023-02-04 DIAGNOSIS — L570 Actinic keratosis: Secondary | ICD-10-CM | POA: Diagnosis not present

## 2023-03-04 ENCOUNTER — Encounter: Payer: Self-pay | Admitting: Family Medicine

## 2023-03-04 ENCOUNTER — Ambulatory Visit: Payer: Medicare HMO | Admitting: Family Medicine

## 2023-03-04 VITALS — BP 136/70 | HR 78 | Temp 98.2°F | Resp 16 | Ht 61.0 in | Wt 124.1 lb

## 2023-03-04 DIAGNOSIS — R197 Diarrhea, unspecified: Secondary | ICD-10-CM | POA: Diagnosis not present

## 2023-03-04 DIAGNOSIS — E785 Hyperlipidemia, unspecified: Secondary | ICD-10-CM | POA: Diagnosis not present

## 2023-03-04 LAB — COMPREHENSIVE METABOLIC PANEL
ALT: 14 U/L (ref 0–35)
AST: 17 U/L (ref 0–37)
Albumin: 4 g/dL (ref 3.5–5.2)
Alkaline Phosphatase: 44 U/L (ref 39–117)
BUN: 9 mg/dL (ref 6–23)
CO2: 34 meq/L — ABNORMAL HIGH (ref 19–32)
Calcium: 10 mg/dL (ref 8.4–10.5)
Chloride: 104 meq/L (ref 96–112)
Creatinine, Ser: 0.7 mg/dL (ref 0.40–1.20)
GFR: 84.85 mL/min (ref >60.00)
Glucose, Bld: 92 mg/dL (ref 70–99)
Potassium: 5 meq/L (ref 3.5–5.1)
Sodium: 142 meq/L (ref 135–145)
Total Bilirubin: 1.7 mg/dL — ABNORMAL HIGH (ref 0.2–1.2)
Total Protein: 6.8 g/dL (ref 6.0–8.3)

## 2023-03-04 LAB — CBC
HCT: 42.9 % (ref 36.0–46.0)
Hemoglobin: 14 g/dL (ref 12.0–15.0)
MCHC: 32.5 g/dL (ref 30.0–36.0)
MCV: 93.8 fL (ref 78.0–100.0)
Platelets: 270 10*3/uL (ref 150.0–400.0)
RBC: 4.58 Mil/uL (ref 3.87–5.11)
RDW: 12.7 % (ref 11.5–15.5)
WBC: 6.6 10*3/uL (ref 4.0–10.5)

## 2023-03-04 MED ORDER — ATORVASTATIN CALCIUM 10 MG PO TABS: 10.0000 mg | ORAL_TABLET | Freq: Every day | ORAL | 3 refills | Status: DC

## 2023-03-04 NOTE — Progress Notes (Signed)
SUBJECTIVE:   Chief Complaint  Patient presents with   Stool Color Change    Black stool no pain   HPI Presents for acute visit  Discussed the use of AI scribe software for clinical note transcription with the patient, who gave verbal consent to proceed.  History of Present Illness The patient, with a history of hyperlipidemia, presented for a six-month follow-up appointment. They reported a recent episode of black, diarrheal stools during a trip to [facility]. The onset of symptoms occurred on the third day of the trip, with episodes of diarrhea persisting throughout the night and into the early morning. The patient noted that the stool was black in color and that they had not consumed any dark foods. They also reported abdominal pain during this time, but denied any nausea or vomiting.  The patient's diet during the trip was significantly different from their usual intake, with consumption of rich foods and red wine, which they do not typically consume. The maximum alcohol intake was two glasses of wine in one sitting. The patient also reported taking Tums during this time in an attempt to alleviate symptoms, but did not find it helpful.  Since returning from the trip, the patient reported that the stool color has lightened to brown and the diarrhea has resolved. They denied any current abdominal pain. The patient also reported feeling weak after the night of continuous diarrhea, but attributed this to lack of sleep.  The patient's medication regimen includes Lipitor and over-the-counter supplements including turmeric and calcium. They denied any recent changes to their medication regimen or the use of any new medications. They also denied any recent use of ibuprofen, aspirin, or other over-the-counter medications that could potentially cause gastrointestinal bleeding.  The patient's past medical history includes a colonoscopy performed approximately a year ago, but the patient was unsure  of the results or when they were due for another screening. They denied any history of constipation or use of laxatives.    PERTINENT PMH / PSH: As above  OBJECTIVE:  BP 136/70   Pulse 78   Temp 98.2 F (36.8 C)   Resp 16   Ht 5\' 1"  (1.549 m)   Wt 124 lb 2 oz (56.3 kg)   SpO2 96%   BMI 23.45 kg/m    Physical Exam Vitals reviewed.  Constitutional:      General: She is not in acute distress.    Appearance: Normal appearance. She is normal weight. She is not ill-appearing, toxic-appearing or diaphoretic.  Eyes:     General:        Right eye: No discharge.        Left eye: No discharge.     Conjunctiva/sclera: Conjunctivae normal.  Cardiovascular:     Rate and Rhythm: Normal rate and regular rhythm.     Heart sounds: Normal heart sounds.  Pulmonary:     Effort: Pulmonary effort is normal.     Breath sounds: Normal breath sounds.  Abdominal:     General: Bowel sounds are normal. There is no distension.     Palpations: Abdomen is soft. There is no mass.     Tenderness: There is no right CVA tenderness, left CVA tenderness, guarding or rebound.  Musculoskeletal:        General: Normal range of motion.  Skin:    General: Skin is warm and dry.  Neurological:     General: No focal deficit present.     Mental Status: She is alert and  oriented to person, place, and time. Mental status is at baseline.  Psychiatric:        Mood and Affect: Mood normal.        Behavior: Behavior normal.        Thought Content: Thought content normal.        Judgment: Judgment normal.        03/04/2023    8:17 AM 09/24/2022   11:32 AM 09/03/2022    9:08 AM 04/05/2022    8:16 AM 02/05/2022    8:13 AM  Depression screen PHQ 2/9  Decreased Interest 0 0 0 0 0  Down, Depressed, Hopeless 0 0 0 0 0  PHQ - 2 Score 0 0 0 0 0  Altered sleeping 1 0 0    Tired, decreased energy 1 0 0    Change in appetite 0 0 0    Feeling bad or failure about yourself  0 0 0    Trouble concentrating 0 0 0     Moving slowly or fidgety/restless 0 0 0    Suicidal thoughts 0 0 0    PHQ-9 Score 2 0 0    Difficult doing work/chores Not difficult at all Not difficult at all Not difficult at all        03/04/2023    8:17 AM 09/03/2022    9:08 AM 11/25/2019    8:54 AM 08/19/2019   10:59 AM  GAD 7 : Generalized Anxiety Score  Nervous, Anxious, on Edge 0 0 0 0  Control/stop worrying 0 0 0 0  Worry too much - different things 0 0 0 0  Trouble relaxing 0 0 0 0  Restless 0 0 0 0  Easily annoyed or irritable 0 0 0 0  Afraid - awful might happen 0 0 0 0  Total GAD 7 Score 0 0 0 0  Anxiety Difficulty Not difficult at all Not difficult at all Not difficult at all Not difficult at all    ASSESSMENT/PLAN:  Diarrhea, unspecified type Assessment & Plan: Recent episode of black diarrhea during a trip to [facility], associated with consumption of rich food and red wine. No current abdominal pain, vomiting, or ongoing diarrhea. Stool color returning to normal. No history of constipation. Hemodynamically stable.  Weight remains unchanged. -Order stool sample to rule out gastrointestinal bleeding. -Order blood work to check hemoglobin and electrolytes. -Consider referral to a Gastroenterologist based on test results. -Stirct return precautions provided  Orders: -     CBC -     Comprehensive metabolic panel -     Fecal Occult Blood, Guaiac; Future -     GI pathogen panel by PCR, stool; Future  Hyperlipidemia, unspecified hyperlipidemia type Assessment & Plan: On Lipitor, no changes reported. -Continue Lipitor.  Orders: -     Atorvastatin Calcium; Take 1 tablet (10 mg total) by mouth daily at 6 PM.  Dispense: 90 tablet; Refill: 3    PDMP reviewed  Return if symptoms worsen or fail to improve, for PCP.  Dana Allan, MD

## 2023-03-04 NOTE — Patient Instructions (Addendum)
It was a pleasure meeting you today. Thank you for allowing me to take part in your health care.  Our goals for today as we discussed include:  We will get some labs today.  If they are abnormal or we need to do something about them, I will call you.  If they are normal, I will send you a message on MyChart (if it is active) or a letter in the mail.  If you don't hear from [place] in 2 weeks, please call the office at the number below.   Will also get stool samples  Ensure well hydrated.    If increasing episodes of diarrhea, unable to keep fluids down or any weakness please go to the ED   This is a list of the screening recommended for you and due dates:  Health Maintenance  Topic Date Due   COVID-19 Vaccine (8 - 2023-24 season) 02/22/2023   Cologuard (Stool DNA test)  06/09/2023   Mammogram  08/06/2023   Medicare Annual Wellness Visit  09/24/2023   DTaP/Tdap/Td vaccine (2 - Td or Tdap) 12/30/2028   Pneumonia Vaccine  Completed   Flu Shot  Completed   DEXA scan (bone density measurement)  Completed   Hepatitis C Screening  Completed   Zoster (Shingles) Vaccine  Completed   HPV Vaccine  Aged Out     Follow up as needed   If you have any questions or concerns, please do not hesitate to call the office at [phone].  I look forward to our next visit and until then take care and stay safe.  Regards,   Dana Allan, MD   [facility]

## 2023-03-08 ENCOUNTER — Encounter: Payer: Self-pay | Admitting: Family Medicine

## 2023-03-08 DIAGNOSIS — R197 Diarrhea, unspecified: Secondary | ICD-10-CM | POA: Insufficient documentation

## 2023-03-08 NOTE — Assessment & Plan Note (Addendum)
Recent episode of black diarrhea during a trip to [facility], associated with consumption of rich food and red wine. No current abdominal pain, vomiting, or ongoing diarrhea. Stool color returning to normal. No history of constipation. Hemodynamically stable.  Weight remains unchanged. -Order stool sample to rule out gastrointestinal bleeding. -Order blood work to check hemoglobin and electrolytes. -Consider referral to a Gastroenterologist based on test results. -Stirct return precautions provided

## 2023-03-08 NOTE — Assessment & Plan Note (Signed)
On Lipitor, no changes reported. -Continue Lipitor.

## 2023-03-11 ENCOUNTER — Other Ambulatory Visit (INDEPENDENT_AMBULATORY_CARE_PROVIDER_SITE_OTHER): Payer: Medicare HMO

## 2023-03-11 DIAGNOSIS — R197 Diarrhea, unspecified: Secondary | ICD-10-CM | POA: Diagnosis not present

## 2023-03-11 LAB — FECAL OCCULT BLOOD, IMMUNOCHEMICAL: Fecal Occult Bld: NEGATIVE

## 2023-03-13 ENCOUNTER — Encounter: Payer: Self-pay | Admitting: Family Medicine

## 2023-06-16 DIAGNOSIS — L570 Actinic keratosis: Secondary | ICD-10-CM | POA: Diagnosis not present

## 2023-06-16 DIAGNOSIS — L821 Other seborrheic keratosis: Secondary | ICD-10-CM | POA: Diagnosis not present

## 2023-06-16 DIAGNOSIS — D225 Melanocytic nevi of trunk: Secondary | ICD-10-CM | POA: Diagnosis not present

## 2023-06-16 DIAGNOSIS — L905 Scar conditions and fibrosis of skin: Secondary | ICD-10-CM | POA: Diagnosis not present

## 2023-06-16 DIAGNOSIS — L814 Other melanin hyperpigmentation: Secondary | ICD-10-CM | POA: Diagnosis not present

## 2023-07-03 ENCOUNTER — Other Ambulatory Visit: Payer: Self-pay | Admitting: Family Medicine

## 2023-07-03 DIAGNOSIS — Z1231 Encounter for screening mammogram for malignant neoplasm of breast: Secondary | ICD-10-CM

## 2023-08-08 ENCOUNTER — Ambulatory Visit

## 2023-08-21 ENCOUNTER — Ambulatory Visit
Admission: RE | Admit: 2023-08-21 | Discharge: 2023-08-21 | Disposition: A | Discharge status: Home / Self Care | Source: Ambulatory Visit | Attending: Family Medicine | Admitting: Family Medicine

## 2023-08-21 DIAGNOSIS — Z1231 Encounter for screening mammogram for malignant neoplasm of breast: Secondary | ICD-10-CM

## 2023-09-08 DIAGNOSIS — L570 Actinic keratosis: Secondary | ICD-10-CM | POA: Diagnosis not present

## 2023-09-09 ENCOUNTER — Telehealth: Payer: Self-pay

## 2023-09-09 ENCOUNTER — Ambulatory Visit (INDEPENDENT_AMBULATORY_CARE_PROVIDER_SITE_OTHER)

## 2023-09-09 VITALS — BP 120/66 | HR 78 | Temp 98.5°F | Ht 61.0 in | Wt 124.2 lb

## 2023-09-09 DIAGNOSIS — M25511 Pain in right shoulder: Secondary | ICD-10-CM | POA: Diagnosis not present

## 2023-09-09 DIAGNOSIS — R42 Dizziness and giddiness: Secondary | ICD-10-CM | POA: Insufficient documentation

## 2023-09-09 DIAGNOSIS — H8111 Benign paroxysmal vertigo, right ear: Secondary | ICD-10-CM | POA: Diagnosis not present

## 2023-09-09 MED ORDER — MECLIZINE HCL 25 MG PO TABS: 25.0000 mg | ORAL_TABLET | Freq: Three times a day (TID) | ORAL | 0 refills | Status: DC | PRN

## 2023-09-09 NOTE — Assessment & Plan Note (Signed)
 Plan per BPPV.

## 2023-09-09 NOTE — Assessment & Plan Note (Signed)
 Acute on chronic right shoulder pain. Positive empty can on right side. D/D includes rotator cuff tendinopathy, osteoarthritis, muscle spasm, shoulder impingement. Recommend physical therapy referral. PT order put in place.

## 2023-09-09 NOTE — Telephone Encounter (Signed)
 Pt scheduled for 10/22/23 at 1pm.

## 2023-09-09 NOTE — Patient Instructions (Addendum)
--   Do Vertigo exercise as attached to your after visit summary.  -- Take Meclizine 25 mg, 1 tablet one to three times daily as needed for vertigo. This medication may cause drowsiness, so be cautious with driving.  -- Physical therapy referral for Vertigo and right shoulder pain has been ordered.  -- If symptoms persists despite vertigo medicine, physical therapy or if worsening symptoms recommend follow up.  -- I also recommend getting carotid artery ultrasound. I will reach out to you with results.

## 2023-09-09 NOTE — Telephone Encounter (Signed)
 If you would like to see pt before January. Let me or Destiny know and one of us  will get her scheduled. Or if you feel that January is an appropriate time frame will schedule her for then.  Thanks

## 2023-09-09 NOTE — Progress Notes (Signed)
 Acute Office Visit  Subjective:     Patient ID: Andrea Spencer, female    DOB: 03-27-1946, 78 y.o.   MRN: [id number]  Chief Complaint  Patient presents with   Dizziness   Tinnitus   Patient is in today for evaluation of following concerns: Dizziness This is a new problem. Episode onset: 3-4 days ago started with room spinning sensation when she was getting up. Episode frequency: for the last 2 days she is having more frequent episodes of room spinning sensation. Episodes lasting for about 10 minutes. Pertinent negatives include no abdominal pain, anorexia, chest pain, coughing, fatigue, fever, headaches, nausea, visual change, vomiting or weakness. Associated symptoms comments: 2 months of right shoulder, neck soreness, noticeable when she is exercising with weight lifting. 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 She denies hearing change. . Exacerbated by: Movement, getting off the bed, going up from sitting makes symptoms worse. She has tried sleep (OTC motion sickness medication) for the symptoms. The treatment provided mild relief.  Shoulder Pain  Chronicity: Right shoulder pain ongoing for about 3 months. There has been no history of extremity trauma. The quality of the pain is described as aching. Pertinent negatives include no fever. Associated symptoms comments: Pain worse with weight lifting exercise, associated with weakness on the right side with repetitive motion.. The symptoms are aggravated by activity.     Review of Systems  Constitutional:  Negative for fatigue and fever.  Respiratory:  Negative for cough.   Cardiovascular:  Negative for chest pain.  Gastrointestinal:  Negative for abdominal pain, anorexia, nausea and vomiting.  Neurological:  Positive for dizziness. Negative for weakness and headaches.   As per HPI    Objective:    BP 120/66   Pulse 78   Temp 98.5 F (36.9 C) (Oral)   Ht 5\' 1"  (1.549 m)   Wt 124 lb 3.2 oz (56.3 kg)   SpO2  95%   BMI 23.47 kg/m    Physical Exam Vitals reviewed.  Constitutional:      Appearance: Normal appearance.  HENT:     Head: Normocephalic and atraumatic.     Comments: Horizontal nystagmus, right eye during Dix-Hallpike test in the clinic.    Right Ear: Tympanic membrane and external ear normal.     Left Ear: Tympanic membrane and external ear normal.     Mouth/Throat:     Mouth: Mucous membranes are moist.  Cardiovascular:     Rate and Rhythm: Normal rate.     Pulses: Normal pulses.     Heart sounds: No murmur heard. Pulmonary:     Effort: Pulmonary effort is normal.     Breath sounds: Normal breath sounds. No wheezing.  Abdominal:     General: Bowel sounds are normal.     Palpations: Abdomen is soft.     Tenderness: There is no guarding.  Musculoskeletal:     Right shoulder: No crepitus. Decreased range of motion. Decreased strength. Normal pulse.     Left shoulder: No bony tenderness. Normal range of motion. Normal pulse.     Cervical back: Normal range of motion and neck supple. No rigidity.     Right lower leg: No edema.     Left lower leg: No edema.     Comments: +empty can test on the right side  Lymphadenopathy:     Cervical: No cervical adenopathy.  Skin:    General: Skin is warm.  Neurological:  Mental Status: She is alert and oriented to person, place, and time.     Cranial Nerves: No cranial nerve deficit, dysarthria or facial asymmetry.     Sensory: Sensation is intact.     Motor: No tremor.     Gait: Gait is intact.  Psychiatric:        Mood and Affect: Mood normal.     No results found for any visits on 09/09/23.     Assessment & Plan:  Benign paroxysmal vertigo, right ear Assessment & Plan: - PT referral for vestibular therapy.  - Recommend home Epley exercise.  - Patient is travelling this Friday, recommend Meclizine 25 mg, one to three times a day prn for symptom improvement. S/e discussed with the patient. Recommend CBC, CMP update to  r/o anemia, electrolytes abnormality contributing to dizziness. Recommend against driving when symptomatic. Obtain b/l carotid ultrasound to r/o carotid artery pathology contributing to dizziness.  - If symptoms worsens recommend clinic f/u in 2 weeks otherwise 4 weeks f/u for TOC.   Orders: -     Ambulatory referral to Physical Therapy  Dizziness Assessment & Plan: Plan per BPPV.   Orders: -     US  Carotid Bilateral; Future -     Comprehensive metabolic panel with GFR -     CBC with Differential/Platelet  Acute pain of right shoulder Assessment & Plan: Acute on chronic right shoulder pain. Positive empty can on right side. D/D includes rotator cuff tendinopathy, osteoarthritis, muscle spasm, shoulder impingement. Recommend physical therapy referral. PT order put in place.   Orders: -     Ambulatory referral to Physical Therapy  Other orders -     Meclizine HCl; Take 1 tablet (25 mg total) by mouth 3 (three) times daily as needed for dizziness.  Dispense: 30 tablet; Refill: 0   Return in about 4 weeks (around 10/07/2023) for TOC from Dr. Sueanne Emerald. Counseled on red flag symptoms like stroke and recommend emergent medical evaluation if she were to have stroke like symptoms. Patient verbalized understanding and agrees with above plan.   Jacklin Mascot, MD

## 2023-09-09 NOTE — Assessment & Plan Note (Signed)
-   PT referral for vestibular therapy.  - Recommend home Epley exercise.  - Patient is travelling this Friday, recommend Meclizine 25 mg, one to three times a day prn for symptom improvement. S/e discussed with the patient. Recommend CBC, CMP update to r/o anemia, electrolytes abnormality contributing to dizziness. Recommend against driving when symptomatic. Obtain b/l carotid ultrasound to r/o carotid artery pathology contributing to dizziness.  - If symptoms worsens recommend clinic f/u in 2 weeks otherwise 4 weeks f/u for TOC.

## 2023-09-09 NOTE — Telephone Encounter (Signed)
 Would recommend appointment sooner, okay to schedule transfer of care in about 4 weeks.   Thank you, Jacklin Mascot, MD

## 2023-09-09 NOTE — Telephone Encounter (Signed)
 Copied from CRM [id number]. Topic: Appointments - Scheduling Inquiry for Clinic >> Sep 09, 2023  4:09 PM [place] J wrote: Reason for CRM: Patient stated that she spoke with Dr. Casimir Cleaver regarding establishing as a patient and Dr. Casimir Cleaver was in agreement. I let patient know that appointments are out until January for Dr. Casimir Cleaver but she was seeing if she could be fit in sooner.   Please call patient back with an update.

## 2023-09-10 ENCOUNTER — Ambulatory Visit: Payer: Self-pay

## 2023-09-10 ENCOUNTER — Ambulatory Visit
Admission: RE | Admit: 2023-09-10 | Discharge: 2023-09-10 | Disposition: A | Discharge status: Home / Self Care | Source: Ambulatory Visit

## 2023-09-10 DIAGNOSIS — R42 Dizziness and giddiness: Secondary | ICD-10-CM | POA: Insufficient documentation

## 2023-09-10 LAB — COMPREHENSIVE METABOLIC PANEL WITH GFR
ALT: 14 U/L (ref 0–35)
AST: 17 U/L (ref 0–37)
Albumin: 4.2 g/dL (ref 3.5–5.2)
Alkaline Phosphatase: 46 U/L (ref 39–117)
BUN: 9 mg/dL (ref 6–23)
CO2: 32 meq/L (ref 19–32)
Calcium: 9.5 mg/dL (ref 8.4–10.5)
Chloride: 104 meq/L (ref 96–112)
Creatinine, Ser: 0.73 mg/dL (ref 0.40–1.20)
GFR: 80.39 mL/min (ref >60.00)
Glucose, Bld: 112 mg/dL — ABNORMAL HIGH (ref 70–99)
Potassium: 4.2 meq/L (ref 3.5–5.1)
Sodium: 142 meq/L (ref 135–145)
Total Bilirubin: 1.3 mg/dL — ABNORMAL HIGH (ref 0.2–1.2)
Total Protein: 6.8 g/dL (ref 6.0–8.3)

## 2023-09-10 LAB — CBC WITH DIFFERENTIAL/PLATELET
Basophils Absolute: 0.1 10*3/uL (ref 0.0–0.1)
Basophils Relative: 1 % (ref 0.0–3.0)
Eosinophils Absolute: 0.3 10*3/uL (ref 0.0–0.7)
Eosinophils Relative: 4.2 % (ref 0.0–5.0)
HCT: 43.1 % (ref 36.0–46.0)
Hemoglobin: 14.3 g/dL (ref 12.0–15.0)
Lymphocytes Relative: 22.4 % (ref 12.0–46.0)
Lymphs Abs: 1.8 10*3/uL (ref 0.7–4.0)
MCHC: 33.2 g/dL (ref 30.0–36.0)
MCV: 94.6 fl (ref 78.0–100.0)
Monocytes Absolute: 0.7 10*3/uL (ref 0.1–1.0)
Monocytes Relative: 9.3 % (ref 3.0–12.0)
Neutro Abs: 5 10*3/uL (ref 1.4–7.7)
Neutrophils Relative %: 63.1 % (ref 43.0–77.0)
Platelets: 283 10*3/uL (ref 150.0–400.0)
RBC: 4.56 Mil/uL (ref 3.87–5.11)
RDW: 12.9 % (ref 11.5–15.5)
WBC: 7.9 10*3/uL (ref 4.0–10.5)

## 2023-09-10 NOTE — Progress Notes (Signed)
 Please let the patient know her carotid ultrasound did no show any significant carotid artery disease. This means her dizziness is not related to narrowing or blockage in the arteries that supplies her brian. She also has normal CBC and electrolytes. Treatment for Vertigo as discussed in the clinic during office visit recommended.   Thank you,  Jacklin Mascot, MD

## 2023-09-10 NOTE — Progress Notes (Signed)
 Following result message sent on lab results encounter from earlier today:  Please let the patient know her carotid ultrasound did no show any significant carotid artery disease. This means her dizziness is not related to narrowing or blockage in the arteries that supplies her brian. She also has normal CBC and electrolytes. Treatment for Vertigo as discussed in the clinic during office visit recommended.   Thank you,  Jacklin Mascot, MD

## 2023-10-07 ENCOUNTER — Ambulatory Visit (INDEPENDENT_AMBULATORY_CARE_PROVIDER_SITE_OTHER): Admitting: *Deleted

## 2023-10-07 VITALS — Ht 61.6 in | Wt 122.0 lb

## 2023-10-07 DIAGNOSIS — Z0000 Encounter for general adult medical examination without abnormal findings: Secondary | ICD-10-CM | POA: Diagnosis not present

## 2023-10-07 DIAGNOSIS — Z1211 Encounter for screening for malignant neoplasm of colon: Secondary | ICD-10-CM | POA: Diagnosis not present

## 2023-10-07 NOTE — Progress Notes (Signed)
 Subjective:   Andrea Spencer is a 78 y.o. who presents for a Medicare Wellness preventive visit.  As a reminder, Annual Wellness Visits don't include a physical exam, and some assessments may be limited, especially if this visit is performed virtually. We may recommend an in-person follow-up visit with your provider if needed.  Visit Complete: Virtual I connected with  Andrea Spencer on 10/07/23 by a audio enabled telemedicine application and verified that I am speaking with the correct person using two identifiers.  Patient Location: Home  Provider Location: Home Office  I discussed the limitations of evaluation and management by telemedicine. The patient expressed understanding and agreed to proceed.  Vital Signs: Because this visit was a virtual/telehealth visit, some criteria may be missing or patient reported. Any vitals not documented were not able to be obtained and vitals that have been documented are patient reported.  VideoDeclined- This patient declined Librarian, academic. Therefore the visit was completed with audio only.  Persons Participating in Visit: Patient.  AWV Questionnaire: No: Patient Medicare AWV questionnaire was not completed prior to this visit.  Cardiac Risk Factors include: advanced age (>3men, >47 women);dyslipidemia     Objective:     Today's Vitals   10/07/23 1504  Weight: 122 lb (55.3 kg)  Height: 5' 1.6" (1.565 m)   Body mass index is 22.6 kg/m.     10/07/2023    3:22 PM 09/24/2022   11:33 AM 09/17/2021    7:54 AM 06/28/2021   10:57 AM 06/26/2020   11:13 AM 04/01/2019   10:45 AM 03/23/2018    8:54 AM  Advanced Directives  Does Patient Have a Medical Advance Directive? No No No No Yes No Yes  Type of Agricultural consultant;Living will  Healthcare Power of [place];Living will  Does patient want to make changes to medical advance directive?     No - Patient declined  No -  Patient declined  Copy of Healthcare Power of Attorney in Chart?     No - copy requested  No - copy requested  Would patient like information on creating a medical advance directive? No - Patient declined No - Patient declined No - Patient declined No - Patient declined  No - Patient declined     Current Medications (verified) Outpatient Encounter Medications as of 10/07/2023  Medication Sig   Acetaminophen  (TYLENOL  ARTHRITIS PAIN PO) Take by mouth.   amoxicillin (AMOXIL) 500 MG capsule Take 500 mg by mouth. 4 prior to dental appointment   Apoaequorin (PREVAGEN EXTRA STRENGTH PO) Take by mouth.   atorvastatin  (LIPITOR) 10 MG tablet Take 1 tablet (10 mg total) by mouth daily at 6 PM.   Calcium  Carb-Cholecalciferol ([employer] CALCIUM  600 & VITAMIN D3 PO) Take by mouth 2 (two) times daily after a meal.   fluticasone  (FLONASE ) 50 MCG/ACT nasal spray Place 2 sprays into both nostrils daily.   Krill Oil 1000 MG CAPS Take 3 capsules by mouth daily.   loratadine  (CLARITIN ) 10 MG tablet Take 1 tablet (10 mg total) by mouth at bedtime as needed for allergies.   meclizine  (ANTIVERT ) 25 MG tablet Take 1 tablet (25 mg total) by mouth 3 (three) times daily as needed for dizziness.   Multiple Vitamin (MULTIVITAMIN WITH MINERALS) TABS tablet Take 1 tablet by mouth daily.   sodium chloride  (OCEAN) 0.65 % SOLN nasal spray Place 2 sprays into both nostrils daily as needed for congestion.   TURMERIC  PO Take by mouth daily.   vitamin C (ASCORBIC ACID) 250 MG tablet Take 500 mg by mouth daily. Taking two tablets daily   Vitamins-Lipotropics (LIPOFLAVONOID PO) Take by mouth in the morning and at bedtime.   aspirin  81 MG tablet Take 81 mg by mouth daily. (Patient not taking: Reported on 10/07/2023)   Biotin 10 MG CAPS Take 1,000 mg by mouth daily. (Patient not taking: Reported on 10/07/2023)   cholecalciferol (VITAMIN D ) 1000 UNITS tablet Take 1,000 Units by mouth daily. (Patient not taking: Reported on 10/07/2023)    ketoconazole  (NIZORAL ) 2 % shampoo APPLY THREE TIMES PER WEEK, MASSAGE INTO SCALP AND LEAVE IN FOR 10 MINUTES BEFORE RINSING OUT (Patient not taking: Reported on 10/07/2023)   triamcinolone  ointment (KENALOG ) 0.1 % Apply twice daily to affected areas up to 2 weeks as needed for itch. Avoid applying to face, groin, and axilla. Use as directed. Risk of skin atrophy with long-term use reviewed. (Patient not taking: Reported on 10/07/2023)   valACYclovir  (VALTREX ) 500 MG tablet Take one tablet twice daily for 10 days, begin 24-36 hours prior to appointment for chemical peel. (Patient not taking: Reported on 10/07/2023)   No facility-administered encounter medications on file as of 10/07/2023.    Allergies (verified) Aspartame and phenylalanine, Meloxicam, Other, and Paxlovid  [nirmatrelvir -ritonavir ]   History: Past Medical History:  Diagnosis Date   Arthritis    Basal cell carcinoma 12/10/2017   Left alar crease. Micronodular pattern   Basal cell carcinoma 12/10/2017   Left upper back. Nodular.    Basal cell carcinoma 12/10/2017   Right medial mid back. Superficial.   Basal cell carcinoma 12/10/2017   Right lateral mid back. Nodular   Basal cell carcinoma 12/24/2018   Right mid back. Nodular   Basal cell carcinoma 12/24/2018   Left anterior shoulder. Nodular   BCC (basal cell carcinoma of skin)    07/02/21 left lower eyelid mohs Dr Milus Alpha   [facility] (basal cell carcinoma) 05/23/2021   left lower eyelid, Mohs completed 07/02/2021   BCC (basal cell carcinoma) 06/06/2022   left anterior shoulder, Mohs 07/09/22   Breast mass    right   Cancer (HCC)    skin cancer SCC, BCC multiple    COVID-19    11/22/20   History of bronchitis    History of pneumonia    Hyperlipidemia    Past Surgical History:  Procedure Laterality Date   BREAST LUMPECTOMY Right 2016   right 2016 negative malignancy fibrocystic changes    COLONOSCOPY     COLONOSCOPY WITH PROPOFOL  N/A 09/17/2021   Procedure: COLONOSCOPY  WITH PROPOFOL ;  Surgeon: Quintin Buckle, DO;  Location: [facility] ENDOSCOPY;  Service: Gastroenterology;  Laterality: N/A;   DILATION AND CURETTAGE OF UTERUS     JOINT REPLACEMENT     left    MOHS SURGERY     x2 face    MOHS SURGERY     L alar 02/04/18    RADIOACTIVE SEED GUIDED EXCISIONAL BREAST BIOPSY Right 04/06/2015   Procedure: RADIOACTIVE SEED GUIDED EXCISIONAL RIGHT BREAST BIOPSY;  Surgeon: Lockie Rima, MD;  Location: [facility] OR;  Service: General;  Laterality: Right;   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   Procedure: TOTAL HIP ARTHROPLASTY ANTERIOR APPROACH;  Surgeon: Saundra Curl, MD;  Location: [facility] OR;  Service: Orthopedics;  Laterality: Left;   WISDOM TOOTH EXTRACTION     Family History  Problem Relation Age of Onset   Stroke Mother    Alzheimer's disease Father  Social History   Socioeconomic History   Marital status: Widowed    Spouse name: Not on file   Number of children: Not on file   Years of education: Not on file   Highest education level: Not on file  Occupational History   Not on file  Tobacco Use   Smoking status: Never   Smokeless tobacco: Never  Vaping Use   Vaping status: Never Used  Substance and Sexual Activity   Alcohol use: Yes    Comment: 2-3 drinks/week,none last 24rs   Drug use: No   Sexual activity: Not Currently  Other Topics Concern   Not on file  Social History Narrative   Widowed husband died 10/26/2018    1 son lives in [place] ; also has step kids (son and daughter though deceased husband Williard)   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    Owns guns, wear seat belt    Safe in relationship    1 dog Viszla named Engineer, site   Social Drivers of Corporate investment banker Strain: Low Risk  (10/07/2023)   Overall Financial Resource Strain (CARDIA)    Difficulty of Paying Living Expenses: Not hard at all  Food Insecurity: No Food Insecurity (10/07/2023)   Hunger Vital Sign    Worried About Running Out of Food in the Last Year: Never  true    Ran Out of Food in the Last Year: Never true  Transportation Needs: No Transportation Needs (10/07/2023)   PRAPARE - Administrator, Civil Service (Medical): No    Lack of Transportation (Non-Medical): No  Physical Activity: Sufficiently Active (10/07/2023)   Exercise Vital Sign    Days of Exercise per Week: 5 days    Minutes of Exercise per Session: 90 min  Stress: No Stress Concern Present (10/07/2023)   [employer] of Occupational Health - Occupational Stress Questionnaire    Feeling of Stress : Only a little  Social Connections: Moderately Integrated (10/07/2023)   Social Connection and Isolation Panel [NHANES]    Frequency of Communication with Friends and Family: More than three times a week    Frequency of Social Gatherings with Friends and Family: More than three times a week    Attends Religious Services: More than 4 times per year    Active Member of [employer] or Organizations: Yes    Attends Banker Meetings: More than 4 times per year    Marital Status: Widowed    Tobacco Counseling Counseling given: Not Answered    Clinical Intake:  Pre-visit preparation completed: Yes  Pain : No/denies pain     BMI - recorded: 22.6 Nutritional Status: BMI of 19-24  Normal Nutritional Risks: None Diabetes: No  Lab Results  Component Value Date   HGBA1C 5.7 08/29/2022   HGBA1C 5.6 09/09/2019     How often do you need to have someone help you when you read instructions, pamphlets, or other written materials from your doctor or pharmacy?: 1 - Never  Interpreter Needed?: No  Information entered by :: R. Aesha Agrawal LPN   Activities of Daily Living     10/07/2023    3:07 PM  In your present state of health, do you have any difficulty performing the following activities:  Hearing? 0  Vision? 0  Comment readers  Difficulty concentrating or making decisions? 0  Walking or climbing stairs? 0  Dressing or bathing? 0  Doing errands, shopping? 0   Preparing Food and eating ? N  Using the Toilet? N  In the past six months, have you accidently leaked urine? N  Do you have problems with loss of bowel control? N  Managing your Medications? N  Managing your Finances? N  Housekeeping or managing your Housekeeping? N    Patient Care Team: Valli Gaw, MD as PCP - General (Family Medicine)  I have updated your Care Teams any recent Medical Services you may have received from other providers in the past year.     Assessment:    This is a routine wellness examination for Andrea Spencer.  Hearing/Vision screen Hearing Screening - Comments:: No issues Vision Screening - Comments:: readers   Goals Addressed             This Visit's Progress    Patient Stated       Wants to continue to go to the gym and work in her yard       Depression Screen     10/07/2023    3:18 PM 09/09/2023    1:17 PM 03/04/2023    8:17 AM 09/24/2022   11:32 AM 09/03/2022    9:08 AM 04/05/2022    8:16 AM 02/05/2022    8:13 AM  PHQ 2/9 Scores  PHQ - 2 Score 0 0 0 0 0 0 0  PHQ- 9 Score 0 0 2 0 0      Fall Risk     10/07/2023    3:09 PM 09/09/2023    1:17 PM 03/04/2023    8:17 AM 09/24/2022   11:34 AM 09/23/2022    1:46 PM  Fall Risk   Falls in the past year? 0 0 0 0 0  Number falls in past yr: 0 0 0 0 0  Injury with Fall? 0 0 0 0 0  Risk for fall due to : No Fall Risks No Fall Risks No Fall Risks No Fall Risks   Follow up Falls evaluation completed;Falls prevention discussed Falls evaluation completed Falls evaluation completed Falls prevention discussed;Falls evaluation completed     MEDICARE RISK AT HOME:  Medicare Risk at Home Any stairs in or around the home?: Yes If so, are there any without handrails?: No Home free of loose throw rugs in walkways, pet beds, electrical cords, etc?: Yes Adequate lighting in your home to reduce risk of falls?: Yes Life alert?: No Use of a cane, walker or w/c?: No Grab bars in the bathroom?: No Shower chair  or bench in shower?: No Elevated toilet seat or a handicapped toilet?: Yes  TIMED UP AND GO:  Was the test performed?  No  Cognitive Function: 6CIT completed    03/23/2018    9:21 AM  MMSE - Mini Mental State Exam  Orientation to time 5  Orientation to Place 5  Registration 3  Attention/ Calculation 5  Recall 3  Language- name 2 objects 2  Language- repeat 1  Language- follow 3 step command 3  Language- read & follow direction 1  Write a sentence 1  Copy design 1  Total score 30        10/07/2023    3:22 PM 09/24/2022   11:38 AM 04/01/2019   10:48 AM  6CIT Screen  What Year? 0 points 0 points 0 points  What month? 0 points 0 points 0 points  What time? 0 points 0 points 0 points  Count back from 20 0 points 0 points 0 points  Months in reverse 0 points 0 points 0 points  Repeat phrase  2 points 0 points 0 points  Total Score 2 points 0 points 0 points    Immunizations Immunization History  Administered Date(s) Administered   Fluad Quad(high Dose 65+) 12/29/2018, 01/10/2022   Influenza, High Dose Seasonal PF 12/26/2017, 01/08/2021, 12/28/2022   Influenza-Unspecified 12/17/2019, 03/06/2022   PFIZER Comirnaty(Gray Top)Covid-19 Tri-Sucrose Vaccine 08/16/2020   PFIZER(Purple Top)SARS-COV-2 Vaccination 05/24/2019, 06/14/2019, 01/27/2020   Pfizer Covid-19 Vaccine Bivalent Booster 52yrs & up 02/22/2021   Pfizer(Comirnaty)Fall Seasonal Vaccine 12 years and older 02/13/2022, 12/28/2022   Pneumococcal Conjugate-13 06/18/2013   Pneumococcal Polysaccharide-23 12/26/2017   Tdap 12/31/2018   Zoster Recombinant(Shingrix) 12/26/2017, 05/15/2018    Screening Tests Health Maintenance  Topic Date Due   Fecal DNA (Cologuard)  06/09/2023   COVID-19 Vaccine (8 - Pfizer risk 2024-25 season) 06/27/2023   Medicare Annual Wellness (AWV)  09/24/2023   INFLUENZA VACCINE  11/28/2023   MAMMOGRAM  08/20/2024   DTaP/Tdap/Td (2 - Td or Tdap) 12/30/2028   Pneumonia Vaccine 26+ Years old   Completed   DEXA SCAN  Completed   Hepatitis C Screening  Completed   Zoster Vaccines- Shingrix  Completed   HPV VACCINES  Aged Out   Meningococcal B Vaccine  Aged Out    Health Maintenance  Health Maintenance Due  Topic Date Due   Fecal DNA (Cologuard)  06/09/2023   COVID-19 Vaccine (8 - Pfizer risk 2024-25 season) 06/27/2023   Medicare Annual Wellness (AWV)  09/24/2023   Health Maintenance Items Addressed: Cologuard Ordered, Patient declines covid vaccine.   Additional Screening:  Vision Screening: Recommended annual ophthalmology exams for early detection of glaucoma and other disorders of the eye. Up to date [facility] Would you like a referral to an eye doctor? No    Dental Screening: Recommended annual dental exams for proper oral hygiene  Community Resource Referral / Chronic Care Management: CRR required this visit?  No   CCM required this visit?  No   Plan:    I have personally reviewed and noted the following in the patient's chart:   Medical and social history Use of alcohol, tobacco or illicit drugs  Current medications and supplements including opioid prescriptions. Patient is not currently taking opioid prescriptions. Functional ability and status Nutritional status Physical activity Advanced directives List of other physicians Hospitalizations, surgeries, and ER visits in previous 12 months Vitals Screenings to include cognitive, depression, and falls Referrals and appointments  In addition, I have reviewed and discussed with patient certain preventive protocols, quality metrics, and best practice recommendations. A written personalized care plan for preventive services as well as general preventive health recommendations were provided to patient.   Felicitas Horse, LPN   1/61/0960   After Visit Summary: (MyChart) Due to this being a telephonic visit, the after visit summary with patients personalized plan was offered to patient via MyChart    Notes: Nothing significant to report at this time.

## 2023-10-07 NOTE — Patient Instructions (Signed)
 Andrea Spencer , Thank you for taking time out of your busy schedule to complete your Annual Wellness Visit with me. I enjoyed our conversation and look forward to speaking with you again next year. I, as well as your care team,  appreciate your ongoing commitment to your health goals. Please review the following plan we discussed and let me know if I can assist you in the future. Your Game plan/ To Do List    Referrals: If you haven't heard from the office you've been referred to, please reach out to them at the phone provided.  Cologuard order has been placed.  Follow up Visits: Next Medicare AWV with our clinical staff: 10/08/24 @ 9:30   Have you seen your provider in the last 6 months (3 months if uncontrolled diabetes)? Yes Next Office Visit with your provider: 10/22/23  Clinician Recommendations:  Aim for 30 minutes of exercise or brisk walking, 6-8 glasses of water, and 5 servings of fruits and vegetables each day.       This is a list of the screening recommended for you and due dates:  Health Maintenance  Topic Date Due   Cologuard (Stool DNA test)  06/09/2023   COVID-19 Vaccine (8 - Pfizer risk 2024-25 season) 06/27/2023   Flu Shot  11/28/2023   Mammogram  08/20/2024   Medicare Annual Wellness Visit  10/06/2024   DTaP/Tdap/Td vaccine (2 - Td or Tdap) 12/30/2028   Pneumonia Vaccine  Completed   DEXA scan (bone density measurement)  Completed   Hepatitis C Screening  Completed   Zoster (Shingles) Vaccine  Completed   HPV Vaccine  Aged Out   Meningitis B Vaccine  Aged Out    Advanced directives: (ACP Link)Information on Advanced Care Planning can be found at [place]  Secretary of [facility] Advance Health Care Directives Advance Health Care Directives. [email or web address]  Advance Care Planning is important because it:  [x]  Makes sure you receive the medical care that is consistent with your values, goals, and preferences  [x]  It provides guidance to your family and loved ones and  reduces their decisional burden about whether or not they are making the right decisions based on your wishes.  Follow the link provided in your after visit summary or read over the paperwork we have mailed to you to help you started getting your Advance Directives in place. If you need assistance in completing these, please reach out to us  so that we can help you!

## 2023-10-16 ENCOUNTER — Telehealth: Payer: Self-pay

## 2023-10-16 NOTE — Telephone Encounter (Signed)
 Patient had mammogram on 08/21/23.

## 2023-10-17 DIAGNOSIS — Z1211 Encounter for screening for malignant neoplasm of colon: Secondary | ICD-10-CM | POA: Diagnosis not present

## 2023-10-21 LAB — COLOGUARD: COLOGUARD: NEGATIVE

## 2023-10-22 ENCOUNTER — Encounter

## 2023-10-22 ENCOUNTER — Ambulatory Visit: Payer: Self-pay | Admitting: Family Medicine

## 2023-10-22 DIAGNOSIS — H6993 Unspecified Eustachian tube disorder, bilateral: Secondary | ICD-10-CM

## 2023-10-22 DIAGNOSIS — E785 Hyperlipidemia, unspecified: Secondary | ICD-10-CM

## 2023-12-03 NOTE — Telephone Encounter (Signed)
 Left message for patient to call office to schedule a Transfer of Care appointment.

## 2023-12-16 ENCOUNTER — Ambulatory Visit: Payer: Self-pay

## 2023-12-16 ENCOUNTER — Ambulatory Visit (INDEPENDENT_AMBULATORY_CARE_PROVIDER_SITE_OTHER): Admitting: Nurse Practitioner

## 2023-12-16 VITALS — BP 124/64 | HR 64 | Temp 98.1°F | Ht 61.6 in | Wt 125.8 lb

## 2023-12-16 DIAGNOSIS — H8111 Benign paroxysmal vertigo, right ear: Secondary | ICD-10-CM | POA: Diagnosis not present

## 2023-12-16 MED ORDER — MECLIZINE HCL 25 MG PO TABS: 25.0000 mg | ORAL_TABLET | Freq: Three times a day (TID) | ORAL | 0 refills | Status: DC | PRN

## 2023-12-16 NOTE — Telephone Encounter (Signed)
 Copied from CRM [id number]. Topic: Clinical - Prescription Issue >> Dec 16, 2023  3:58 PM Melissa C wrote: Reason for CRM: patient just left the doctor's office and prescription was sent to pharmacy for meclizine  (ANTIVERT ) 25 MG tablet. However, she states she now remembers the last time she took this medicine he was unable to tolerate it. She is wondering if there is something else she can take, and would like this medication cancelled.  Please advise with patient.

## 2023-12-16 NOTE — Progress Notes (Signed)
 Leron Glance, NP-C Phone: [phone]  Andrea Spencer is a 78 y.o. female who presents today for vertigo.   Discussed the use of AI scribe software for clinical note transcription with the patient, who gave verbal consent to proceed.  History of Present Illness   Andrea Spencer is a 78 year old female who presents with recurrent episodes of vertigo.  She has been experiencing recurrent episodes of vertigo for the past three weeks. The initial episode was severe, rendering her unable to walk and requiring her to lie down until the symptoms subsided. Subsequent episodes have been less severe but occur every other day, often triggered by positional changes such as getting up from bed or during physical activities at the gym. The vertigo episodes typically last about five minutes, although the initial severe episode lasted approximately half an hour. The sensation is described as the room spinning, and she often has to lie down and cover her eyes until it passes.  Previously, she was prescribed meclizine , which was helpful during her first episode, but she has not taken it recently as she ran out of the medication. She has not experienced any speech problems, numbness, tingling, or abdominal pain associated with the vertigo. She mentions feeling tired and not as energetic as usual, which is a new symptom for her.  She uses Flonase  twice daily for nasal symptoms, although she is only prescribed enough for once daily use. She has a history of buzzing in her ears, which she can temporarily alleviate by applying pressure to her eardrums. She has previously seen an ENT specialist about a year ago, who conducted various tests but concluded there was nothing further she could do for her ear symptoms.      Social History   Tobacco Use  Smoking Status Never  Smokeless Tobacco Never    Current Outpatient Medications on File Prior to Visit  Medication Sig Dispense Refill   Acetaminophen   (TYLENOL  ARTHRITIS PAIN PO) Take by mouth.     amoxicillin (AMOXIL) 500 MG capsule Take 500 mg by mouth. 4 prior to dental appointment     Apoaequorin (PREVAGEN EXTRA STRENGTH PO) Take by mouth.     aspirin  81 MG tablet Take 81 mg by mouth daily. (Patient not taking: Reported on 10/07/2023)     atorvastatin  (LIPITOR) 10 MG tablet Take 1 tablet (10 mg total) by mouth daily at 6 PM. 90 tablet 3   Biotin 10 MG CAPS Take 1,000 mg by mouth daily. (Patient not taking: Reported on 10/07/2023)     Calcium  Carb-Cholecalciferol ([facility] CALCIUM  600 & VITAMIN D3 PO) Take by mouth 2 (two) times daily after a meal.     cholecalciferol (VITAMIN D ) 1000 UNITS tablet Take 1,000 Units by mouth daily. (Patient not taking: Reported on 10/07/2023)     fluticasone  (FLONASE ) 50 MCG/ACT nasal spray Place 2 sprays into both nostrils daily. 16 g 11   ketoconazole  (NIZORAL ) 2 % shampoo APPLY THREE TIMES PER WEEK, MASSAGE INTO SCALP AND LEAVE IN FOR 10 MINUTES BEFORE RINSING OUT (Patient not taking: Reported on 10/07/2023) 120 mL 3   Krill Oil 1000 MG CAPS Take 3 capsules by mouth daily.     loratadine  (CLARITIN ) 10 MG tablet Take 1 tablet (10 mg total) by mouth at bedtime as needed for allergies. 90 tablet 3   Multiple Vitamin (MULTIVITAMIN WITH MINERALS) TABS tablet Take 1 tablet by mouth daily.     sodium chloride  (OCEAN) 0.65 % SOLN nasal spray Place 2 sprays  into both nostrils daily as needed for congestion. 30 mL 11   triamcinolone  ointment (KENALOG ) 0.1 % Apply twice daily to affected areas up to 2 weeks as needed for itch. Avoid applying to face, groin, and axilla. Use as directed. Risk of skin atrophy with long-term use reviewed. (Patient not taking: Reported on 10/07/2023) 60 g 2   TURMERIC PO Take by mouth daily.     valACYclovir  (VALTREX ) 500 MG tablet Take one tablet twice daily for 10 days, begin 24-36 hours prior to appointment for chemical peel. (Patient not taking: Reported on 10/07/2023) 20 tablet 0   vitamin C  (ASCORBIC ACID) 250 MG tablet Take 500 mg by mouth daily. Taking two tablets daily     Vitamins-Lipotropics (LIPOFLAVONOID PO) Take by mouth in the morning and at bedtime.     No current facility-administered medications on file prior to visit.     ROS see history of present illness  Objective  Physical Exam Vitals:   12/16/23 1442  BP: 124/64  Pulse: 64  Temp: 98.1 F (36.7 C)  SpO2: 96%    BP Readings from Last 3 Encounters:  12/16/23 124/64  09/09/23 120/66  03/04/23 136/70   Wt Readings from Last 3 Encounters:  12/16/23 125 lb 12.8 oz (57.1 kg)  10/07/23 122 lb (55.3 kg)  09/09/23 124 lb 3.2 oz (56.3 kg)    Physical Exam Constitutional:      General: She is not in acute distress.    Appearance: Normal appearance.  HENT:     Head: Normocephalic.  Cardiovascular:     Rate and Rhythm: Normal rate and regular rhythm.     Heart sounds: Normal heart sounds.  Pulmonary:     Effort: Pulmonary effort is normal.     Breath sounds: Normal breath sounds.  Skin:    General: Skin is warm and dry.  Neurological:     General: No focal deficit present.     Mental Status: She is alert.     Sensory: Sensation is intact.     Motor: Motor function is intact. No weakness.     Coordination: Coordination is intact.     Gait: Gait is intact.  Psychiatric:        Mood and Affect: Mood normal.        Behavior: Behavior normal.     Assessment/Plan: Please see individual problem list.  BPPV (benign paroxysmal positional vertigo), right Assessment & Plan: Recurrent vertigo worsens with positional changes. Previous ENT evaluation, lab work and carotid ultrasound were unremarkable. Meclizine  is effective but currently unavailable. Vestibular therapy was recommended but not started. Tinnitus is temporarily relieved by manual pressure. Refill meclizine  prescription and initiate a referral for vestibular physical therapy. Recommend Epley maneuver at home, printout provided. Return  precautions given to patient.   Orders: -     Ambulatory referral to Physical Therapy     Return if symptoms worsen or fail to improve.   Leron Glance, NP-C [facility]

## 2023-12-19 ENCOUNTER — Other Ambulatory Visit: Payer: Self-pay | Admitting: Nurse Practitioner

## 2023-12-19 DIAGNOSIS — H8111 Benign paroxysmal vertigo, right ear: Secondary | ICD-10-CM

## 2023-12-19 MED ORDER — PROMETHAZINE HCL 12.5 MG PO TABS: 12.5000 mg | ORAL_TABLET | Freq: Three times a day (TID) | ORAL | 0 refills | Status: DC | PRN

## 2023-12-19 NOTE — Telephone Encounter (Signed)
 Vm left asking pt to cb

## 2023-12-19 NOTE — Telephone Encounter (Signed)
Pt informed

## 2023-12-19 NOTE — Telephone Encounter (Unsigned)
 Copied from CRM [id number]. Topic: Clinical - Prescription Issue >> Dec 19, 2023  3:14 PM Suzen RAMAN wrote: Reason for CRM: per previous inquiry about medication(meclizine  (ANTIVERT ) 25 MG tablet) medication made patient pass out and individual that found her states she was palm had to apply cold compress to get patient to come to. Patient states she would like an alternate medication to be called into the pharmacy. Patient also declined suggestion to taken 1/2 of the tablet to see how she tolerates it.

## 2023-12-23 ENCOUNTER — Encounter: Payer: Self-pay | Admitting: Nurse Practitioner

## 2023-12-23 NOTE — Assessment & Plan Note (Addendum)
 Recurrent vertigo worsens with positional changes. Previous ENT evaluation, lab work and carotid ultrasound were unremarkable. Meclizine  is effective but currently unavailable. Vestibular therapy was recommended but not started. Tinnitus is temporarily relieved by manual pressure. Refill meclizine  prescription and initiate a referral for vestibular physical therapy. Recommend Epley maneuver at home, printout provided. Return precautions given to patient.

## 2024-01-16 ENCOUNTER — Other Ambulatory Visit: Payer: Self-pay | Admitting: Nurse Practitioner

## 2024-01-16 DIAGNOSIS — H8111 Benign paroxysmal vertigo, right ear: Secondary | ICD-10-CM

## 2024-02-02 ENCOUNTER — Other Ambulatory Visit: Payer: Self-pay

## 2024-02-02 DIAGNOSIS — H6993 Unspecified Eustachian tube disorder, bilateral: Secondary | ICD-10-CM

## 2024-02-02 NOTE — Telephone Encounter (Unsigned)
 Copied from CRM #[id number]. Topic: Clinical - Medication Refill >> Feb 02, 2024 10:52 AM Frederich PARAS wrote: Medication: fluticasone  Flonase    Has the patient contacted their pharmacy? Yes  They sent her a txt saying noon replied to the order  This is the patient's preferred pharmacy:  [id number] [place] - [address] [place] [address] Phone: [phone] Fax: [phone]  Is this the correct pharmacy for this prescription? Yes If no, delete pharmacy and type the correct one.   Has the prescription been filled recently? Yes  Is the patient out of the medication? Yes   Has the patient been seen for an appointment in the last year OR does the patient have an upcoming appointment? Yes  Can we respond through MyChart? No  Agent: Please be advised that Rx refills may take up to 3 business days. We ask that you follow-up with your pharmacy.

## 2024-02-03 MED ORDER — FLUTICASONE PROPIONATE 50 MCG/ACT NA SUSP: 2.0000 | Freq: Every day | NASAL | 11 refills | Status: AC

## 2024-03-16 NOTE — Telephone Encounter (Signed)
 open in error

## 2024-04-12 ENCOUNTER — Other Ambulatory Visit: Payer: Self-pay | Admitting: Family

## 2024-04-12 DIAGNOSIS — E785 Hyperlipidemia, unspecified: Secondary | ICD-10-CM

## 2024-05-17 ENCOUNTER — Emergency Department

## 2024-05-17 ENCOUNTER — Emergency Department: Admission: EM | Admit: 2024-05-17 | Discharge: 2024-05-17 | Disposition: A | Discharge status: Home / Self Care

## 2024-05-17 DIAGNOSIS — K7689 Other specified diseases of liver: Secondary | ICD-10-CM | POA: Insufficient documentation

## 2024-05-17 DIAGNOSIS — Z96641 Presence of right artificial hip joint: Secondary | ICD-10-CM | POA: Diagnosis not present

## 2024-05-17 DIAGNOSIS — K769 Liver disease, unspecified: Secondary | ICD-10-CM

## 2024-05-17 DIAGNOSIS — N200 Calculus of kidney: Secondary | ICD-10-CM

## 2024-05-17 DIAGNOSIS — N132 Hydronephrosis with renal and ureteral calculous obstruction: Secondary | ICD-10-CM | POA: Insufficient documentation

## 2024-05-17 DIAGNOSIS — R10A2 Flank pain, left side: Secondary | ICD-10-CM | POA: Diagnosis present

## 2024-05-17 LAB — COMPREHENSIVE METABOLIC PANEL WITH GFR
ALT: 13 U/L (ref 0–44)
AST: 32 U/L (ref 15–41)
Albumin: 3.9 g/dL (ref 3.5–5.0)
Alkaline Phosphatase: 54 U/L (ref 38–126)
Anion gap: 13 (ref 5–15)
BUN: 10 mg/dL (ref 8–23)
CO2: 23 mmol/L (ref 22–32)
Calcium: 8.8 mg/dL — ABNORMAL LOW (ref 8.9–10.3)
Chloride: 104 mmol/L (ref 98–111)
Creatinine, Ser: 0.82 mg/dL (ref 0.44–1.00)
GFR, Estimated: >60 mL/min
Glucose, Bld: 142 mg/dL — ABNORMAL HIGH (ref 70–99)
Potassium: 3.8 mmol/L (ref 3.5–5.1)
Sodium: 140 mmol/L (ref 135–145)
Total Bilirubin: 1.1 mg/dL (ref 0.0–1.2)
Total Protein: 6.3 g/dL — ABNORMAL LOW (ref 6.5–8.1)

## 2024-05-17 LAB — URINALYSIS, ROUTINE W REFLEX MICROSCOPIC
Bilirubin Urine: NEGATIVE
Glucose, UA: NEGATIVE mg/dL
Hgb urine dipstick: NEGATIVE
Ketones, ur: 5 mg/dL — AB
Leukocytes,Ua: NEGATIVE
Nitrite: NEGATIVE
Protein, ur: NEGATIVE mg/dL
Specific Gravity, Urine: >1.046 — ABNORMAL HIGH (ref 1.005–1.030)
pH: 5 (ref 5.0–8.0)

## 2024-05-17 LAB — CBC
HCT: 39.9 % (ref 36.0–46.0)
Hemoglobin: 13.1 g/dL (ref 12.0–15.0)
MCH: 30.9 pg (ref 26.0–34.0)
MCHC: 32.8 g/dL (ref 30.0–36.0)
MCV: 94.1 fL (ref 80.0–100.0)
Platelets: 260 K/uL (ref 150–400)
RBC: 4.24 MIL/uL (ref 3.87–5.11)
RDW: 12.4 % (ref 11.5–15.5)
WBC: 11.2 K/uL — ABNORMAL HIGH (ref 4.0–10.5)
nRBC: 0 % (ref 0.0–0.2)

## 2024-05-17 LAB — LIPASE, BLOOD: Lipase: 27 U/L (ref 11–51)

## 2024-05-17 MED ORDER — SODIUM CHLORIDE 0.9 % IV BOLUS: 500.0000 mL | Freq: Once | INTRAVENOUS | Status: DC

## 2024-05-17 MED ORDER — IOHEXOL 300 MG/ML  SOLN
100.0000 mL | Freq: Once | INTRAMUSCULAR | Status: AC | PRN
  Administered 2024-05-17: 100 mL via INTRAVENOUS

## 2024-05-17 MED ORDER — ONDANSETRON 4 MG PO TBDP: 4.0000 mg | ORAL_TABLET | Freq: Three times a day (TID) | ORAL | 0 refills | Status: DC | PRN

## 2024-05-17 MED ORDER — OXYCODONE HCL 5 MG PO TABS: 5.0000 mg | ORAL_TABLET | ORAL | 0 refills | Status: DC | PRN

## 2024-05-17 MED ORDER — ONDANSETRON HCL 4 MG/2ML IJ SOLN
4.0000 mg | Freq: Once | INTRAMUSCULAR | Status: AC
  Administered 2024-05-17: 4 mg via INTRAVENOUS
  Filled 2024-05-17: qty 2

## 2024-05-17 MED ORDER — SODIUM CHLORIDE 0.9 % IV BOLUS: 1000.0000 mL | Freq: Once | INTRAVENOUS | Status: DC

## 2024-05-17 MED ORDER — MORPHINE SULFATE (PF) 4 MG/ML IV SOLN
4.0000 mg | Freq: Once | INTRAVENOUS | Status: AC
  Administered 2024-05-17: 4 mg via INTRAVENOUS
  Filled 2024-05-17: qty 1

## 2024-05-17 MED ORDER — KETOROLAC TROMETHAMINE 15 MG/ML IJ SOLN
15.0000 mg | Freq: Once | INTRAMUSCULAR | Status: AC
  Administered 2024-05-17: 15 mg via INTRAVENOUS
  Filled 2024-05-17: qty 1

## 2024-05-17 MED ORDER — SODIUM CHLORIDE 0.9 % IV BOLUS
1000.0000 mL | Freq: Once | INTRAVENOUS | Status: AC
  Administered 2024-05-17: 1000 mL via INTRAVENOUS

## 2024-05-17 NOTE — Discharge Instructions (Signed)
 You were seen today due to concern of abdominal pain.  You have a kidney stone that is likely causing your symptoms, I have written for some pain medication and some nausea medication for you to take at home, please take these as instructed.  You may also take Tylenol  and ibuprofen to help manage your symptoms.  If you have worsening symptoms such as fevers, uncontrolled pain, or any other symptoms you find concerning please return to the emergency department immediately for further medical management.  As we have discussed you also have a possible lesion on your recent hip replacement you need to follow-up with the orthopedic surgeon to have this further evaluated, I have placed the contact information for our orthopedic surgeon you may also follow-up with your own orthopedic surgeon.  Your liver also has a abnormal lesion on this, please follow-up with your primary doctor to discuss further management and evaluation of this.

## 2024-05-17 NOTE — ED Provider Notes (Signed)
 "  [facility] Provider Note    Event Date/Time   First MD Initiated Contact with Patient 05/17/24 0153     (approximate)   History   Abdominal Pain   HPI  Andrea Spencer is a 79 y.o. female present with concern of lower abdominal pain and left flank pain.  Symptoms began yesterday morning, continued throughout the day frequent urinary frequency and burning but also diarrhea.  Mild nausea no vomiting episodes.  She was given fluids Zofran  and fentanyl  and route, seems that symptoms improved in response to this, but upon my assessment, seems that symptoms are now recurring again.  Denies any abdominal surgical history, she does take atorvastatin  but no other medication use.  No other complaints at this time, no known sick contacts.     Physical Exam   Triage Vital Signs: ED Triage Vitals  Encounter Vitals Group     BP 05/17/24 0155 135/75     Girls Systolic BP Percentile --      Girls Diastolic BP Percentile --      Boys Systolic BP Percentile --      Boys Diastolic BP Percentile --      Pulse Rate 05/17/24 0155 66     Resp 05/17/24 0155 13     Temp 05/17/24 0155 (!) 97.5 F (36.4 C)     Temp Source 05/17/24 0155 Oral     SpO2 05/17/24 0155 98 %     Weight 05/17/24 0156 135 lb 12.9 oz (61.6 kg)     Height 05/17/24 0156 5' 1 (1.549 m)     Head Circumference --      Peak Flow --      Pain Score 05/17/24 0156 4     Pain Loc --      Pain Education --      Exclude from Growth Chart --     Most recent vital signs: Vitals:   05/17/24 0253 05/17/24 0635  BP:  (!) 105/56  Pulse:  72  Resp:  16  Temp:    SpO2: 98% 98%     General: Awake, no distress.  CV:  Good peripheral perfusion.  Resp:  Normal effort.  Abd:  No distention.  Discomfort to palpation along the left lower quadrant of the abdomen as well as the left CVA Other:     ED Results / Procedures / Treatments   Labs (all labs ordered are listed, but only abnormal results are  displayed) Labs Reviewed  COMPREHENSIVE METABOLIC PANEL WITH GFR - Abnormal; Notable for the following components:      Result Value   Glucose, Bld 142 (*)    Calcium  8.8 (*)    Total Protein 6.3 (*)    All other components within normal limits  CBC - Abnormal; Notable for the following components:   WBC 11.2 (*)    All other components within normal limits  URINALYSIS, ROUTINE W REFLEX MICROSCOPIC - Abnormal; Notable for the following components:   Color, Urine YELLOW (*)    APPearance CLEAR (*)    Specific Gravity, Urine >1.046 (*)    Ketones, ur 5 (*)    All other components within normal limits  LIPASE, BLOOD     EKG     RADIOLOGY   PROCEDURES:  Critical Care performed: No  Procedures   MEDICATIONS ORDERED IN ED: Medications  morphine  (PF) 4 MG/ML injection 4 mg (4 mg Intravenous Given 05/17/24 0257)  sodium chloride  0.9 % bolus 1,000  mL (1,000 mLs Intravenous New Bag/Given 05/17/24 0302)  ondansetron  (ZOFRAN ) injection 4 mg (4 mg Intravenous Given 05/17/24 0305)  iohexol  (OMNIPAQUE ) 300 MG/ML solution 100 mL (100 mLs Intravenous Contrast Given 05/17/24 0318)  ketorolac  (TORADOL ) 15 MG/ML injection 15 mg (15 mg Intravenous Given 05/17/24 0510)     IMPRESSION / MDM / ASSESSMENT AND PLAN / ED COURSE  I reviewed the triage vital signs and the nursing notes.                               Patient's presentation is most consistent with acute complicated illness / injury requiring diagnostic workup.  79 year old female presenting with concern of left lower quadrant abdominal pain in the setting of urinary frequency.  She appears well she is not in any acute distress given a significant amount of fentanyl  and route by EMS, she has tenderness to palpation along the left lower quadrant.  Considering possibly diverticulitis versus pyelonephritis versus nephrolithiasis amongst other etiologies.  She has a slight leukocytosis here.  Will go ahead and obtain labs imaging  attempt symptomatic management and determine further workup accordingly.   Clinical Course as of 05/17/24 0701  Mon May 17, 2024  [id number] Patient appears comfortable resting in bed right now, awaiting CT results as well as urinalysis. [SK]  [phone] Patient with meloxicam allergy, she is able to take ibuprofen, has not had any issues with the medication Toradol  in the past, will give her a dose of this.  CT imaging demonstrating a 6 mm kidney stone at the left UPJ.  Given her urinary symptoms, awaiting urinalysis result at this time.  Also making mention of aseptic loosening or particle disease concern of her prior hip prosthesis.  Fortunately without any significant hip pain at this time, she informs me 2 weeks ago when [place] was at the beach she did have significant pain along the hip but this is since resolved.  Will await urinalysis result and determine appropriate consultations and disposition accordingly.  Also making mention of hepatic wedge, unsure what to make of this however patient with normal LFTs and no complaints of right upper quadrant abdominal pain, I find likely reassuring at this time and feel reasonable for outpatient MRI.  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 [SK]  [phone] Patient resting comfortably in exam room, continue encourage for urination. [SK]  [phone] Spoke with Dr. Cleotilde regarding the patient's CT imaging, no acute intervention warranted at this time, recommending routine outpatient orthopedic surgery follow-up. [SK]  [phone] Still awaiting urinalysis here at this time, I discussed the plan with the patient, she verbalizes understanding, she is agreeable with this plan. [SK]  0700 Patient urinalysis is reassuring we will have her discharged home at this time.  I discussed with her plan for follow-up and CT imaging findings, she verbalized understanding and importance of follow-up, she is aware of return precautions. [SK]     Clinical Course User Index [SK] Fernand Rossie HERO, MD     FINAL CLINICAL IMPRESSION(S) / ED DIAGNOSES   Final diagnoses:  Nephrolithiasis  History of right hip replacement  Liver lesion     Rx / DC Orders   ED Discharge Orders          Ordered    oxyCODONE  (ROXICODONE ) 5 MG immediate release tablet  Every 4 hours PRN        05/17/24 0651  ondansetron  (ZOFRAN -ODT) 4 MG disintegrating tablet  Every 8 hours PRN        05/17/24 0651    Ambulatory Referral to Primary Care (Establish Care)        05/17/24 0700             Note:  This document was prepared using Dragon voice recognition software and may include unintentional dictation errors.   Fernand Rossie HERO, MD 05/17/24 [phone]  "

## 2024-05-17 NOTE — ED Triage Notes (Signed)
 Pt here via EMS from home for flu-like symptoms (N/D/abdominal pain for the past 2 days). Pt notes symptoms started after coming back from [facility]. Pt unsure if from caustic/infectious agent. Notes husband not having similar symptoms. Denies V/fever/CP/SOB. Pt received 250 mL NS, 4 mg Zofran  (0058), and 200 mcg fentanyl  (last 0135). Pt is also endorsing some dysuria. Pt looks well, but uncomfortable. Denies recent use of antibiotics.

## 2024-05-18 ENCOUNTER — Other Ambulatory Visit: Payer: Self-pay

## 2024-05-18 ENCOUNTER — Emergency Department
Admission: EM | Admit: 2024-05-18 | Discharge: 2024-05-18 | Disposition: A | Discharge status: Home / Self Care | Payer: No Typology Code available for payment source | Attending: Emergency Medicine | Admitting: Emergency Medicine

## 2024-05-18 DIAGNOSIS — N201 Calculus of ureter: Secondary | ICD-10-CM | POA: Insufficient documentation

## 2024-05-18 DIAGNOSIS — R10A2 Flank pain, left side: Secondary | ICD-10-CM | POA: Diagnosis present

## 2024-05-18 DIAGNOSIS — Z85828 Personal history of other malignant neoplasm of skin: Secondary | ICD-10-CM | POA: Insufficient documentation

## 2024-05-18 LAB — CBC WITH DIFFERENTIAL/PLATELET
Abs Immature Granulocytes: 0.05 K/uL (ref 0.00–0.07)
Basophils Absolute: 0 K/uL (ref 0.0–0.1)
Basophils Relative: 0 %
Eosinophils Absolute: 0 K/uL (ref 0.0–0.5)
Eosinophils Relative: 0 %
HCT: 38.5 % (ref 36.0–46.0)
Hemoglobin: 12.7 g/dL (ref 12.0–15.0)
Immature Granulocytes: 0 %
Lymphocytes Relative: 8 %
Lymphs Abs: 1.2 K/uL (ref 0.7–4.0)
MCH: 31.1 pg (ref 26.0–34.0)
MCHC: 33 g/dL (ref 30.0–36.0)
MCV: 94.4 fL (ref 80.0–100.0)
Monocytes Absolute: 1.5 K/uL — ABNORMAL HIGH (ref 0.1–1.0)
Monocytes Relative: 9 %
Neutro Abs: 13.4 K/uL — ABNORMAL HIGH (ref 1.7–7.7)
Neutrophils Relative %: 83 %
Platelets: 219 K/uL (ref 150–400)
RBC: 4.08 MIL/uL (ref 3.87–5.11)
RDW: 12.6 % (ref 11.5–15.5)
WBC: 16.2 K/uL — ABNORMAL HIGH (ref 4.0–10.5)
nRBC: 0 % (ref 0.0–0.2)

## 2024-05-18 LAB — URINALYSIS, ROUTINE W REFLEX MICROSCOPIC
Bilirubin Urine: NEGATIVE
Glucose, UA: NEGATIVE mg/dL
Hgb urine dipstick: NEGATIVE
Ketones, ur: 5 mg/dL — AB
Leukocytes,Ua: NEGATIVE
Nitrite: NEGATIVE
Protein, ur: NEGATIVE mg/dL
Specific Gravity, Urine: 1.019 (ref 1.005–1.030)
pH: 5 (ref 5.0–8.0)

## 2024-05-18 LAB — BASIC METABOLIC PANEL WITH GFR
Anion gap: 12 (ref 5–15)
BUN: 12 mg/dL (ref 8–23)
CO2: 24 mmol/L (ref 22–32)
Calcium: 9 mg/dL (ref 8.9–10.3)
Chloride: 102 mmol/L (ref 98–111)
Creatinine, Ser: 1.13 mg/dL — ABNORMAL HIGH (ref 0.44–1.00)
GFR, Estimated: 50 mL/min — ABNORMAL LOW
Glucose, Bld: 117 mg/dL — ABNORMAL HIGH (ref 70–99)
Potassium: 4.3 mmol/L (ref 3.5–5.1)
Sodium: 137 mmol/L (ref 135–145)

## 2024-05-18 MED ORDER — ONDANSETRON HCL 4 MG/2ML IJ SOLN
4.0000 mg | Freq: Once | INTRAMUSCULAR | Status: AC
  Administered 2024-05-18: 4 mg via INTRAVENOUS
  Filled 2024-05-18: qty 2

## 2024-05-18 MED ORDER — MORPHINE SULFATE (PF) 4 MG/ML IV SOLN
4.0000 mg | Freq: Once | INTRAVENOUS | Status: AC
  Administered 2024-05-18: 4 mg via INTRAVENOUS
  Filled 2024-05-18: qty 1

## 2024-05-18 MED ORDER — KETOROLAC TROMETHAMINE 15 MG/ML IJ SOLN
15.0000 mg | Freq: Once | INTRAMUSCULAR | Status: AC
  Administered 2024-05-18: 15 mg via INTRAVENOUS
  Filled 2024-05-18: qty 1

## 2024-05-18 MED ORDER — TAMSULOSIN HCL 0.4 MG PO CAPS: 0.4000 mg | ORAL_CAPSULE | Freq: Every day | ORAL | 0 refills | Status: DC

## 2024-05-18 MED ORDER — SODIUM CHLORIDE 0.9 % IV BOLUS
500.0000 mL | Freq: Once | INTRAVENOUS | Status: AC
  Administered 2024-05-18: 500 mL via INTRAVENOUS

## 2024-05-18 NOTE — ED Provider Notes (Signed)
 "  [facility] Provider Note    Event Date/Time   First MD Initiated Contact with Patient 05/18/24 [phone]     (approximate)   History   kidney stone   HPI  Andrea Spencer is a 79 y.o. female with PMH of basal cell carcinoma, hyperlipidemia presents for evaluation of left flank pain.  Patient was seen in the emergency department yesterday and diagnosed with a kidney stone.  She was given oxycodone  and Zofran .  Patient reports she has had ongoing pain in the left flank.  Patient states she has only been taking half of the oxycodone .       Physical Exam   Triage Vital Signs: ED Triage Vitals [05/18/24 0845]  Encounter Vitals Group     BP (!) 151/72     Girls Systolic BP Percentile      Girls Diastolic BP Percentile      Boys Systolic BP Percentile      Boys Diastolic BP Percentile      Pulse Rate 71     Resp 18     Temp 98.8 F (37.1 C)     Temp src      SpO2 96 %     Weight 134 lb 7.7 oz (61 kg)     Height 5' 1 (1.549 m)     Head Circumference      Peak Flow      Pain Score 10     Pain Loc      Pain Education      Exclude from Growth Chart     Most recent vital signs: Vitals:   05/18/24 0845 05/18/24 1251  BP: (!) 151/72 (!) 140/70  Pulse: 71 70  Resp: 18 18  Temp: 98.8 F (37.1 C) 98 F (36.7 C)  SpO2: 96% 97%   General: Awake, no distress.  CV:  Good peripheral perfusion. RRR. Resp:  Normal effort. CTAB. Abd:  No distention. Soft, TTP in left side.  Other:     ED Results / Procedures / Treatments   Labs (all labs ordered are listed, but only abnormal results are displayed) Labs Reviewed  URINALYSIS, ROUTINE W REFLEX MICROSCOPIC - Abnormal; Notable for the following components:      Result Value   Color, Urine YELLOW (*)    APPearance CLEAR (*)    Ketones, ur 5 (*)    All other components within normal limits  BASIC METABOLIC PANEL WITH GFR - Abnormal; Notable for the following components:   Glucose, Bld 117 (*)     Creatinine, Ser 1.13 (*)    GFR, Estimated 50 (*)    All other components within normal limits  CBC WITH DIFFERENTIAL/PLATELET - Abnormal; Notable for the following components:   WBC 16.2 (*)    Neutro Abs 13.4 (*)    Monocytes Absolute 1.5 (*)    All other components within normal limits    PROCEDURES:  Critical Care performed: No  Procedures   MEDICATIONS ORDERED IN ED: Medications  ondansetron  (ZOFRAN ) injection 4 mg (4 mg Intravenous Given 05/18/24 1155)  morphine  (PF) 4 MG/ML injection 4 mg (4 mg Intravenous Given 05/18/24 1156)  ketorolac  (TORADOL ) 15 MG/ML injection 15 mg (15 mg Intravenous Given 05/18/24 1155)  sodium chloride  0.9 % bolus 500 mL (500 mLs Intravenous New Bag/Given 05/18/24 1154)     IMPRESSION / MDM / ASSESSMENT AND PLAN / ED COURSE  I reviewed the triage vital signs and the nursing notes.  79 year old female presents for evaluation of kidney stone on the left side.  Blood pressure was elevated which I suspect is due to pain.  Vital signs stable otherwise.  Patient uncomfortable appearing on initial assessment.  Differential diagnosis includes, but is not limited to, nephrolithiasis, pyelonephritis, pain control, electrolyte abnormality, AKI.  Patient's presentation is most consistent with acute complicated illness / injury requiring diagnostic workup.  Reviewed patient's chart and the documentation from her visit in the ED yesterday.  Patient's CT scan showed a 6 mm stone at the UPJ.  Patient was given oxycodone  and Zofran .  Patient return to the ED she was unable to control her pain at home.  Will repeat blood work to ensure no signs of infection or AKI.  Will recheck a urinalysis as well.  CBC shows mildly elevated white blood cell count, BMP shows mildly elevated creatinine otherwise unremarkable.  Urinalysis without signs of infection.  Do not think that repeat imaging would be beneficial.  Patient's pain was much  improved after receiving the Zofran , Toradol , morphine  and fluids.    Spoke at length with patient on plan for pain control.  I do not think patient had a good understanding of how to treat the pain.  I feel that after our conversation she is more confident on how to treat her pain at home.  Will also send some tamsulosin  to aid in the passage of the stone.  Gave her follow-up with urology.  Discussed return precautions.  Patient voiced understanding, all questions were answered and she was stable at discharge.      FINAL CLINICAL IMPRESSION(S) / ED DIAGNOSES   Final diagnoses:  Ureterolithiasis     Rx / DC Orders   ED Discharge Orders          Ordered    tamsulosin  (FLOMAX ) 0.4 MG CAPS capsule  Daily        05/18/24 1305             Note:  This document was prepared using Dragon voice recognition software and may include unintentional dictation errors.   Cleaster Tinnie LABOR, PA-C 05/18/24 1309  "

## 2024-05-18 NOTE — ED Notes (Addendum)
 See triage note  Presents with abd pain  States she was seen yesterday  Dxd' with renal stones  States cont'd to have pain  States she only took 1/2 of her oxycodone   States she is a little better at present

## 2024-05-18 NOTE — ED Triage Notes (Signed)
 Pt to ED for continued left flank pain. Seen at ED yesterday and dx with kidney stone. States has not been taking as much of oxycodone  as prescribed. Denies difficulty urinating.

## 2024-05-18 NOTE — Discharge Instructions (Addendum)
 You can take 650 mg of Tylenol  and 600 mg of ibuprofen every 6 hours as needed for pain. You can use ice, heat, muscle creams and other topical pain relievers as well.  If after taking both the Tylenol  and the ibuprofen you are still having severe pain, you can take the oxycodone .  You can take half a tablet, 2.5 mg every 2-4 hours or a full tablet, 5 mg every 4-6 hours.  This medication is sedating so do not drive after taking it.  The nausea medication is called Zofran .  This can be taken every 8 hours.  I recommend taking this at least half an hour before taking the oxycodone  to prevent development of nausea from the pain medication.  I sent a medication called tamsulosin  to the pharmacy.  This medication is to help relax the ureter to make it easier for you to pass the stone.  Please follow-up with urology whose information is attached.  Call to schedule an appointment.  Return to the ED with worsening pain, fever or vomiting so severe that you cannot keep medication down.

## 2024-05-19 ENCOUNTER — Inpatient Hospital Stay: Payer: Self-pay | Admitting: Family Medicine

## 2024-05-19 ENCOUNTER — Ambulatory Visit (INDEPENDENT_AMBULATORY_CARE_PROVIDER_SITE_OTHER)

## 2024-05-19 ENCOUNTER — Telehealth: Payer: Self-pay

## 2024-05-19 VITALS — BP 132/80 | HR 96 | Temp 98.5°F | Ht 61.25 in | Wt 132.0 lb

## 2024-05-19 DIAGNOSIS — N200 Calculus of kidney: Secondary | ICD-10-CM | POA: Diagnosis not present

## 2024-05-19 DIAGNOSIS — Z96642 Presence of left artificial hip joint: Secondary | ICD-10-CM | POA: Diagnosis not present

## 2024-05-19 DIAGNOSIS — K7689 Other specified diseases of liver: Secondary | ICD-10-CM | POA: Diagnosis not present

## 2024-05-19 MED ORDER — KETOROLAC TROMETHAMINE 30 MG/ML IJ SOLN
30.0000 mg | Freq: Once | INTRAMUSCULAR | Status: AC
  Administered 2024-05-19: 30 mg via INTRAMUSCULAR

## 2024-05-19 NOTE — Telephone Encounter (Signed)
 Tried to call pt,again. Goes straight to VM.

## 2024-05-19 NOTE — Patient Instructions (Addendum)
 Thank you for visiting [facility] today! Here's what we talked about: - Continue oxycodone  as needed. No ibuprofen until tomorrow - Continue tamsulosin  daily, can discontinue it either when you see stone is passed or when pain is completely resolved - Drink at least 30-40oz of water daily  - Call Dr Loraine office if you do not hear from them in 2 weeks  - Imagining will call you for your liver MRI

## 2024-05-19 NOTE — Progress Notes (Unsigned)
 "  Subjective:   This visit was conducted in person. The patient gave informed consent to the use of Abridge AI technology to record the contents of the encounter as documented below.   Patient ID: Andrea Spencer, female    DOB: 1945-06-29, 79 y.o.   MRN: [id number]   Discussed the use of AI scribe software for clinical note transcription with the patient, who gave verbal consent to proceed.  History of Present Illness Andrea Spencer is a 79 year old female who presents with left flank pain due to a kidney stone. She recently visited the emergency room for severe left flank pain, rated between seven and eight on a scale of ten, and described as painful during respiration. The pain has persisted, necessitating a return visit to the emergency room the following day, where she received additional medication and fluids.  She is currently taking oxycodone  5 mg as needed for pain, typically half a pill to avoid nausea, but took a full pill this morning due to severe pain, resulting in significant drowsiness. She also uses Zofran  as needed for nausea. Recent labs indicated a high white blood cell count and reduced kidney function. She is taking tamsulosin  daily to aid in passing the stone.  She has a history of left hip replacement and reports changes in the hardware, which she suspects may be related to recent pain experienced during physical activity. She recalls [place] the beach where she struggled to walk due to pain, which she now believes may be linked to the hip hardware issues.  She drinks a cup of coffee every morning and generally prefers water.      Review of Systems  All other systems reviewed and are negative.       Allergies[1]  Medications Ordered Prior to Encounter[2]  BP 132/80 (BP Location: Left Arm, Patient Position: Sitting, Cuff Size: Normal)   Pulse 96   Temp 98.5 F (36.9 C) (Oral)   Ht 5' 1.25 (1.556 m)   Wt 132 lb (59.9 kg)   BMI 24.74 kg/m    Objective:      Physical Exam GENERAL: Alert, cooperative, well developed, no acute distress. HEAD: Normocephalic atraumatic. EYES: Extraocular movements intact bilaterally, pupils round, equal and reactive to light bilaterally, conjunctivae normal bilaterally. ABDOMEN: Soft, non-distended, without organomegaly, normal bowel sounds. Tenderness to palpation over the left lower abdomen. Positive CVA tenderness on the left side. EXTREMITIES: Capillary refill 2-3 seconds. No cyanosis or edema. NEUROLOGICAL: Oriented to person, place and time, no gait abnormalities, moves all extremities without gross motor or sensory deficit.         Assessment & Plan:   New patient, past medical and social history thoroughly reviewed and updated in chart.  Assessment & Plan Left kidney stone with obstruction and pain Acute left flank pain due to obstructive kidney stone. Elevated WBC likely stress-related.  ED documentation from 1/19 and 1/20 reviewed.  CT scan from 1/19 reviewed, labs reviewed as well.  Was initially seen in the ED on 1/19 where she presented with left flank pain, was diagnosed with kidney stone, CT showed 6 x 4 x 4 mm obstructing left UPJ stone at the level of L3-L4, with a 2 mm left interpolar collecting system stone, it also showed left-sided hydronephrosis. She was prescribed oxycodone  and Zofran . Return to the ED on 1/20 for persistent left flank pain, she received Zofran , Toradol , morphine , tamsulosin  and IVF's. Soon to f/u with urology for stent placement. Most recent labs  did show reduced GFR at 50, expected given nephrolithiasis, plan to repeat at a later time.  Encouraging hydration for now. Pain inadequately controlled, administering Toradol  this visit.  - Administered Toradol  injection for pain relief, effective for 6 hours. - Continue oxycodone  as needed for pain, noting drowsiness as a side effect. - Use high-dose ibuprofen (4 tablets every 8 hours) as alternative pain  management. - Continue tamsulosin  daily until stone passage or pain resolution. - Increase fluid intake to 30-40 ounces daily to aid stone passage. - Scheduled follow-up kidney labs in 3 weeks to monitor function.  Liver cyst and indeterminate liver lesion CT scan revealed l 2.1 x 1.6 cm wedge-shaped focal enhancement in hepatic segment, unclear whether perfusion anomaly versus lesion, liver MRI recommended, patient is asymptomatic at this time, follow-up imaging ordered as below. - Ordered liver MRI with contrast to evaluate cyst and lesion.  History of left hip arthroplasty with hardware complication CT scan showed 3.6 x 1.9 x 2.2 cm lucency in the lower left ilium, possible aseptic loosening of hardware versus particle disease, worsened from prior imaging, does confer increased fracture risk, orthopedic evaluation recommended, referral sent.  Overall, patient does mention chronic discomfort in the left hip. - Referred to orthopedic surgery for evaluation of hardware changes.    Return in about 5 days (around 05/24/2024) for TOC and flank pain already scheduled. Needs lab visit in 3 wks.   Valisha Heslin K Hanad Leino, MD  05/19/24     Contains text generated by Abridge.        [1]  Allergies Allergen Reactions   Aspartame And Phenylalanine    Meclizine     Meloxicam Hives and Diarrhea   Other Itching    Sunscreens Lotions and Tanning products   Paxlovid  [Nirmatrelvir -Ritonavir ] Diarrhea  [2]  Current Outpatient Medications on File Prior to Visit  Medication Sig Dispense Refill   Acetaminophen  (TYLENOL  ARTHRITIS PAIN PO) Take by mouth.     Apoaequorin (PREVAGEN EXTRA STRENGTH PO) Take by mouth.     atorvastatin  (LIPITOR) 10 MG tablet TAKE 1 TABLET (10 MG TOTAL) BY MOUTH DAILY AT 6 PM. 90 tablet 0   Calcium  Carb-Cholecalciferol ([employer] CALCIUM  600 & VITAMIN D3 PO) Take by mouth 2 (two) times daily after a meal.     fluticasone  (FLONASE ) 50 MCG/ACT nasal spray Place 2 sprays into both nostrils  daily. 16 g 11   ketoconazole  (NIZORAL ) 2 % shampoo APPLY THREE TIMES PER WEEK, MASSAGE INTO SCALP AND LEAVE IN FOR 10 MINUTES BEFORE RINSING OUT 120 mL 3   Krill Oil 1000 MG CAPS Take 3 capsules by mouth daily.     loratadine  (CLARITIN ) 10 MG tablet Take 1 tablet (10 mg total) by mouth at bedtime as needed for allergies. 90 tablet 3   Multiple Vitamin (MULTIVITAMIN WITH MINERALS) TABS tablet Take 1 tablet by mouth daily.     ondansetron  (ZOFRAN -ODT) 4 MG disintegrating tablet Take 1 tablet (4 mg total) by mouth every 8 (eight) hours as needed. 12 tablet 0   oxyCODONE  (ROXICODONE ) 5 MG immediate release tablet Take 1 tablet (5 mg total) by mouth every 4 (four) hours as needed for severe pain (pain score 7-10). 12 tablet 0   promethazine  (PHENERGAN ) 12.5 MG tablet TAKE 1-2 TABLETS (12.5-25 MG TOTAL) BY MOUTH EVERY 8 (EIGHT) HOURS AS NEEDED. 30 tablet 0   sodium chloride  (OCEAN) 0.65 % SOLN nasal spray Place 2 sprays into both nostrils daily as needed for congestion. 30 mL 11   tamsulosin  (FLOMAX )  0.4 MG CAPS capsule Take 1 capsule (0.4 mg total) by mouth daily. 30 capsule 0   TURMERIC PO Take by mouth daily.     vitamin C (ASCORBIC ACID) 250 MG tablet Take 500 mg by mouth daily. Taking two tablets daily     Vitamins-Lipotropics (LIPOFLAVONOID PO) Take by mouth in the morning and at bedtime.     No current facility-administered medications on file prior to visit.   "

## 2024-05-19 NOTE — Telephone Encounter (Signed)
 Left message on VM per DPR that we need to move her appt with Dr Avelina today at 12pm to Dr Bennett at 1220 pm today. The 1220 has been blocked as a PCP Review. Asked pt to check in at 1205. Asked her to call the office and let us  know.   Please provide message from provider/office when call is returned from patient.

## 2024-05-20 ENCOUNTER — Ambulatory Visit
Admission: RE | Admit: 2024-05-20 | Discharge: 2024-05-20 | Disposition: A | Discharge status: Home / Self Care | Source: Ambulatory Visit | Attending: Urology | Admitting: Urology

## 2024-05-20 ENCOUNTER — Ambulatory Visit: Admitting: Urology

## 2024-05-20 ENCOUNTER — Telehealth: Payer: Self-pay

## 2024-05-20 ENCOUNTER — Other Ambulatory Visit: Payer: Self-pay

## 2024-05-20 VITALS — BP 137/80 | HR 86 | Ht 61.0 in | Wt 131.0 lb

## 2024-05-20 DIAGNOSIS — N200 Calculus of kidney: Secondary | ICD-10-CM

## 2024-05-20 DIAGNOSIS — N201 Calculus of ureter: Secondary | ICD-10-CM | POA: Diagnosis not present

## 2024-05-20 NOTE — Progress Notes (Signed)
" ° °  [facility] Surgical Posting Form  Surgery Date: Date: 05/21/2024  Surgeon: Dr. Glendia Barba, MD  Inpt ( No  )   Outpt (Yes)   Obs ( No  )   Diagnosis: N20.1 Left Ureteral Stone  -CPT: 7120251106  Surgery: Left Ureteroscopy with Laser Lithotripsy and Stent Placement  Stop Anticoagulations: No, may continue all and may continue ASA  Cardiac/Medical/Pulmonary Clearance needed: no  *Orders entered into EPIC  Date: 05/20/24   *Case booked in [place]  Date: 05/20/24  *Notified pt of Surgery: Date: 05/20/24  PRE-OP UA & CX: no  *Placed into Prior Authorization Work [place] Date: 05/20/24  Assistant/laser/rep:No                "

## 2024-05-20 NOTE — Telephone Encounter (Signed)
 Per Dr. Twylla, Patient is to be scheduled for  Left Ureteroscopy with Laser Lithotripsy and Stent Placement   Mrs. Andrea Spencer was contacted and possible surgical dates were discussed, Friday January 23rd, 2026 was agreed upon for surgery.   Patient was directed to call [phone] between 1-3pm the day before surgery to find out surgical arrival time.  Instructions were given not to eat or drink from midnight on the night before surgery and have a driver for the day of surgery. On the surgery day patient was instructed to enter through the [facility] entrance of [facility] report the Same Day Surgery desk.   Pre-Admit Testing will be in contact via phone to set up an interview with the anesthesia team to review your history and medications prior to surgery.   Reminder of this information was sent via MyChart to the patient.

## 2024-05-20 NOTE — Progress Notes (Signed)
 "  05/20/24 11:02 AM   Andrea Spencer Mar 25, 1946 [id number]  CC: Left ureteral stone  HPI: Relatively healthy 79 year old female who presented to the ER on 05/17/2024 with nausea and severe left flank pain.  CT showed a 6 mm left UPJ stone, urinalysis benign, mild AKI, and she was discharged with medical expulsive therapy and urology follow-up.  She continues to have fairly severe left-sided flank pain, taking pain medications around-the-clock.  Denies any fevers or chills.  No prior stone history.   PMH: Past Medical History:  Diagnosis Date   Arthritis    Basal cell carcinoma 12/10/2017   Left alar crease. Micronodular pattern   Basal cell carcinoma 12/10/2017   Left upper back. Nodular.    Basal cell carcinoma 12/10/2017   Right medial mid back. Superficial.   Basal cell carcinoma 12/10/2017   Right lateral mid back. Nodular   Basal cell carcinoma 12/24/2018   Right mid back. Nodular   Basal cell carcinoma 12/24/2018   Left anterior shoulder. Nodular   BCC (basal cell carcinoma of skin)    07/02/21 left lower eyelid mohs Dr Renita   [facility] (basal cell carcinoma) 05/23/2021   left lower eyelid, Mohs completed 07/02/2021   BCC (basal cell carcinoma) 06/06/2022   left anterior shoulder, Mohs 07/09/22   Breast mass    right   Cancer (HCC)    skin cancer SCC, BCC multiple    COVID-19    11/22/20   History of bronchitis    History of pneumonia    Hyperlipidemia     Surgical History: Past Surgical History:  Procedure Laterality Date   BREAST LUMPECTOMY Right 2016   right 2016 negative malignancy fibrocystic changes    COLONOSCOPY     COLONOSCOPY WITH PROPOFOL  N/A 09/17/2021   Procedure: COLONOSCOPY WITH PROPOFOL ;  Surgeon: Onita Elspeth Sharper, DO;  Location: [facility] ENDOSCOPY;  Service: Gastroenterology;  Laterality: N/A;   DILATION AND CURETTAGE OF UTERUS     JOINT REPLACEMENT     left    MOHS SURGERY     x2 face    MOHS SURGERY     L alar 02/04/18    RADIOACTIVE  SEED GUIDED EXCISIONAL BREAST BIOPSY Right 04/06/2015   Procedure: RADIOACTIVE SEED GUIDED EXCISIONAL RIGHT BREAST BIOPSY;  Surgeon: Jina Nephew, MD;  Location: [facility] OR;  Service: General;  Laterality: Right;   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   Procedure: TOTAL HIP ARTHROPLASTY ANTERIOR APPROACH;  Surgeon: Evalene JONETTA Chancy, MD;  Location: [facility] OR;  Service: Orthopedics;  Laterality: Left;   WISDOM TOOTH EXTRACTION       Family History: Family History  Problem Relation Age of Onset   Stroke Mother    Alzheimer's disease Father     Social History:  reports that she has never smoked. She has never used smokeless tobacco. She reports current alcohol use. She reports that she does not use drugs.  Physical Exam: BP 137/80 (BP Location: Left Arm, Patient Position: Sitting, Cuff Size: Normal)   Pulse 86   Ht 5' 1 (1.549 m)   Wt 131 lb (59.4 kg)   SpO2 96%   BMI 24.75 kg/m    Constitutional:  Alert and oriented, No acute distress. Cardiovascular: No clubbing, cyanosis, or edema. Respiratory: Normal respiratory effort, no increased work of breathing. GI: Abdomen is soft, nontender, nondistended, no abdominal masses   Laboratory Data: Reviewed in epic  Pertinent Imaging: I have personally viewed and interpreted the CT scan showing a 6 to 7  mm left proximal ureteral stone, as well as KUB today showing migration to the mid ureter.  Assessment & Plan:   79 year old female with 6 to 7 mm left mid ureteral stone, ongoing renal colic, she strongly desires intervention.  No clinical or laboratory evidence of infection  We discussed various treatment options for urolithiasis including observation with or without medical expulsive therapy, shockwave lithotripsy (SWL), ureteroscopy and laser lithotripsy with stent placement, and percutaneous nephrolithotomy.We discussed that management is based on stone size, location, density, patient co-morbidities, and patient preference. Stones <73mm in  size have a >80% spontaneous passage rate. Data surrounding the use of tamsulosin  for medical expulsive therapy is controversial, but meta analyses suggests it is most efficacious for distal stones between 5-65mm in size. Possible side effects include dizziness/lightheadedness, and retrograde ejaculation.SWL has a lower stone free rate in a single procedure, but also a lower complication rate compared to ureteroscopy and avoids a stent and associated stent related symptoms. Possible complications include renal hematoma, steinstrasse, and need for additional treatment.Ureteroscopy with laser lithotripsy and stent placement has a higher stone free rate than SWL in a single procedure, however increased complication rate including possible infection, ureteral injury, bleeding, and stent related morbidity. Common stent related symptoms include dysuria, urgency/frequency, and flank pain.  After an extensive discussion of the risks and benefits of the above treatment options, the patient would like to proceed with left ureteroscopy and laser lithotripsy this week.  Schedule left ureteroscopy, laser lithotripsy, stent placement with Dr. Twylla tomorrow   Redell Burnet, MD 05/20/2024  [facility] [address], [address] [place], [place] [address] [phone]   "

## 2024-05-20 NOTE — Patient Instructions (Signed)
 Laser Therapy for Kidney Stones Laser therapy for kidney stones is a procedure to break up rock-like masses that form inside the kidneys (kidney stones). It is done using a device that beams a strong light (laser) on the kidney stones. This breaks the stones up into small pieces. These small pieces may leave your body when you pee (urinate) or may be taken out during the procedure.  You may need laser therapy if you have kidney stones that are painful or that are stopping you from being able to pee. Tell a health care provider about: Any allergies you have. All medicines you are taking, including vitamins, herbs, eye drops, creams, and over-the-counter medicines. Any problems you or family members have had with anesthesia. Any bleeding problems you have. Any surgeries you have had. Any medical conditions you have. Whether you are pregnant or may be pregnant. What are the risks? Your health care provider will talk with you about risks. These may include: Infection. Bleeding. Allergic reactions to medicines. Damage to: The part of your body that drains pee (urine) from the bladder (urethra). The bladder. The tube that connects the bladder to the kidneys (ureter). Urinary tract infection (UTI). Urethral stricture. This is when the urethra is narrowed by scarring. Trouble peeing. Blockage of the kidney. This may be caused by a piece of kidney stone. What happens before the procedure? When to stop eating and drinking Follow instructions from your provider about what you may eat and drink. These may include: 8 hours before the procedure Stop eating most foods. Do not eat meat, fried foods, or fatty foods. Eat only light foods, such as toast or crackers. All liquids are okay except energy drinks and alcohol . 6 hours before the procedure Stop eating. Drink only clear liquids, such as water, clear fruit juice, black coffee, plain tea, and sports drinks. Do not drink energy drinks or  alcohol . 2 hours before the procedure Stop drinking all liquids. You may be allowed to take medicines with small sips of water. If you do not follow your provider's instructions, your procedure may be delayed or canceled. Medicines Ask your provider about: Changing or stopping your regular medicines. These include any diabetes medicines or blood thinners you take. Taking medicines such as aspirin  and ibuprofen . These medicines can thin your blood. Do not take them unless your provider tells you to. Taking over-the-counter medicines, vitamins, herbs, and supplements. Tests You may have a physical exam before the procedure. You may also have tests done. These may include: Imaging tests. Blood or pee tests. Surgery safety Ask your provider: How your surgery site will be marked. What steps will be taken to help prevent infection. These steps may include: Removing hair at the surgery site. Washing skin with a soap that kills germs. Taking antibiotics. General instructions Do not use any products that contain nicotine  or tobacco for at least 4 weeks before the procedure. These products include cigarettes, chewing tobacco, and vaping devices, such as e-cigarettes. If you need help quitting, ask your provider. If you will be going home right after the procedure, plan to have a responsible adult: Take you home from the hospital or clinic. You will not be allowed to drive. Care for you for the time you are told. What happens during the procedure?  An IV will be inserted into one of your veins. You will be given: A sedative. This helps you relax. Anesthesia. This keeps you from feeling pain. It will make you fall asleep for surgery. A tool  with a camera on the end (ureteroscope) will be put into your urethra. It will be moved through your bladder to your kidney. It will send pictures to a screen in the operating room. This will show what parts of your kidney need to be treated. A tube will be  put through the ureteroscope. It will be moved into your kidney. The laser device will be put into your kidney through the tube. The laser will be used to break up the kidney stones. A tool with a tiny wire basket may be put through the tube into your kidney. This can help remove the small pieces of the kidney stone. A small mesh tube (stent) may be placed to allow your kidney to drain. The tube and ureteroscope will be taken out at the end of the surgery. The procedure may vary among providers and hospitals. What happens after the procedure? Your blood pressure, heart rate, breathing rate, and blood oxygen  level will be monitored until you leave the hospital or clinic. If you had a stent placed, it may have a string that will be secured to your skin. This helps your provider remove the stent. You may be given a strainer to collect any stone pieces that you pass in your pee. Your provider may have these tested. This information is not intended to replace advice given to you by your health care provider. Make sure you discuss any questions you have with your health care provider. Document Revised: 12/14/2021 Document Reviewed: 12/14/2021 Elsevier Patient Education  2024 [employer].

## 2024-05-20 NOTE — Progress Notes (Signed)
 Surgical Physician Order [facility] [place]  * Scheduling expectation : Friday 1/23, [place]  *Length of Case: 1 hour  *Clearance needed: no  *Anticoagulation Instructions: May continue all anticoagulants  *Aspirin  Instructions: Ok to continue all  *Post-op visit Date/Instructions:  tbd  *Diagnosis: Left Ureteral Stone  *Procedure: left Ureteroscopy w/laser lithotripsy & stent placement (47643)   Additional orders: N/A  -Admit type: OUTpatient  -Anesthesia: General  -VTE Prophylaxis Standing Order SCD's       Other:   -Standing Lab Orders Per Anesthesia    Lab other: None  -Standing Test orders EKG/Chest x-ray per Anesthesia       Test other:   - Medications:  Ancef  2gm IV  -Other orders:  N/A

## 2024-05-20 NOTE — H&P (View-Only) (Signed)
 "  05/20/24 11:02 AM   Andrea Spencer 1946-03-29 [id number]  CC: Left ureteral stone  HPI: Relatively healthy 79 year old female who presented to the ER on 05/17/2024 with nausea and severe left flank pain.  CT showed a 6 mm left UPJ stone, urinalysis benign, mild AKI, and she was discharged with medical expulsive therapy and urology follow-up.  She continues to have fairly severe left-sided flank pain, taking pain medications around-the-clock.  Denies any fevers or chills.  No prior stone history.   PMH: Past Medical History:  Diagnosis Date   Arthritis    Basal cell carcinoma 12/10/2017   Left alar crease. Micronodular pattern   Basal cell carcinoma 12/10/2017   Left upper back. Nodular.    Basal cell carcinoma 12/10/2017   Right medial mid back. Superficial.   Basal cell carcinoma 12/10/2017   Right lateral mid back. Nodular   Basal cell carcinoma 12/24/2018   Right mid back. Nodular   Basal cell carcinoma 12/24/2018   Left anterior shoulder. Nodular   BCC (basal cell carcinoma of skin)    07/02/21 left lower eyelid mohs Dr Renita   [facility] (basal cell carcinoma) 05/23/2021   left lower eyelid, Mohs completed 07/02/2021   BCC (basal cell carcinoma) 06/06/2022   left anterior shoulder, Mohs 07/09/22   Breast mass    right   Cancer (HCC)    skin cancer SCC, BCC multiple    COVID-19    11/22/20   History of bronchitis    History of pneumonia    Hyperlipidemia     Surgical History: Past Surgical History:  Procedure Laterality Date   BREAST LUMPECTOMY Right 2016   right 2016 negative malignancy fibrocystic changes    COLONOSCOPY     COLONOSCOPY WITH PROPOFOL  N/A 09/17/2021   Procedure: COLONOSCOPY WITH PROPOFOL ;  Surgeon: Onita Elspeth Sharper, DO;  Location: [facility] ENDOSCOPY;  Service: Gastroenterology;  Laterality: N/A;   DILATION AND CURETTAGE OF UTERUS     JOINT REPLACEMENT     left    MOHS SURGERY     x2 face    MOHS SURGERY     L alar 02/04/18    RADIOACTIVE  SEED GUIDED EXCISIONAL BREAST BIOPSY Right 04/06/2015   Procedure: RADIOACTIVE SEED GUIDED EXCISIONAL RIGHT BREAST BIOPSY;  Surgeon: Jina Nephew, MD;  Location: [facility] OR;  Service: General;  Laterality: Right;   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   Procedure: TOTAL HIP ARTHROPLASTY ANTERIOR APPROACH;  Surgeon: Evalene JONETTA Chancy, MD;  Location: [facility] OR;  Service: Orthopedics;  Laterality: Left;   WISDOM TOOTH EXTRACTION       Family History: Family History  Problem Relation Age of Onset   Stroke Mother    Alzheimer's disease Father     Social History:  reports that she has never smoked. She has never used smokeless tobacco. She reports current alcohol use. She reports that she does not use drugs.  Physical Exam: BP 137/80 (BP Location: Left Arm, Patient Position: Sitting, Cuff Size: Normal)   Pulse 86   Ht 5' 1 (1.549 m)   Wt 131 lb (59.4 kg)   SpO2 96%   BMI 24.75 kg/m    Constitutional:  Alert and oriented, No acute distress. Cardiovascular: No clubbing, cyanosis, or edema. Respiratory: Normal respiratory effort, no increased work of breathing. GI: Abdomen is soft, nontender, nondistended, no abdominal masses   Laboratory Data: Reviewed in epic  Pertinent Imaging: I have personally viewed and interpreted the CT scan showing a 6 to 7  mm left proximal ureteral stone, as well as KUB today showing migration to the mid ureter.  Assessment & Plan:   79 year old female with 6 to 7 mm left mid ureteral stone, ongoing renal colic, she strongly desires intervention.  No clinical or laboratory evidence of infection  We discussed various treatment options for urolithiasis including observation with or without medical expulsive therapy, shockwave lithotripsy (SWL), ureteroscopy and laser lithotripsy with stent placement, and percutaneous nephrolithotomy.We discussed that management is based on stone size, location, density, patient co-morbidities, and patient preference. Stones <64mm in  size have a >80% spontaneous passage rate. Data surrounding the use of tamsulosin  for medical expulsive therapy is controversial, but meta analyses suggests it is most efficacious for distal stones between 5-25mm in size. Possible side effects include dizziness/lightheadedness, and retrograde ejaculation.SWL has a lower stone free rate in a single procedure, but also a lower complication rate compared to ureteroscopy and avoids a stent and associated stent related symptoms. Possible complications include renal hematoma, steinstrasse, and need for additional treatment.Ureteroscopy with laser lithotripsy and stent placement has a higher stone free rate than SWL in a single procedure, however increased complication rate including possible infection, ureteral injury, bleeding, and stent related morbidity. Common stent related symptoms include dysuria, urgency/frequency, and flank pain.  After an extensive discussion of the risks and benefits of the above treatment options, the patient would like to proceed with left ureteroscopy and laser lithotripsy this week.  Schedule left ureteroscopy, laser lithotripsy, stent placement with Dr. Twylla tomorrow   Redell Burnet, MD 05/20/2024  [facility] [address], [address] [place], [place] [address] [phone]   "

## 2024-05-21 ENCOUNTER — Other Ambulatory Visit: Payer: Self-pay

## 2024-05-21 ENCOUNTER — Encounter
Admission: RE | Disposition: A | Discharge status: Home / Self Care | Payer: Self-pay | Source: Home / Self Care | Attending: Urology

## 2024-05-21 ENCOUNTER — Encounter: Payer: Self-pay | Admitting: Urology

## 2024-05-21 ENCOUNTER — Ambulatory Visit

## 2024-05-21 ENCOUNTER — Ambulatory Visit
Admission: RE | Admit: 2024-05-21 | Discharge: 2024-05-21 | Disposition: A | Discharge status: Home / Self Care | Attending: Urology | Admitting: Urology

## 2024-05-21 ENCOUNTER — Ambulatory Visit: Admitting: Certified Registered"

## 2024-05-21 DIAGNOSIS — N201 Calculus of ureter: Secondary | ICD-10-CM | POA: Insufficient documentation

## 2024-05-21 DIAGNOSIS — M25511 Pain in right shoulder: Secondary | ICD-10-CM

## 2024-05-21 DIAGNOSIS — Z01818 Encounter for other preprocedural examination: Secondary | ICD-10-CM

## 2024-05-21 MED ORDER — DEXAMETHASONE SOD PHOSPHATE PF 10 MG/ML IJ SOLN
INTRAMUSCULAR | Status: DC | PRN
  Administered 2024-05-21: 10 mg via INTRAVENOUS

## 2024-05-21 MED ORDER — ONDANSETRON HCL 4 MG/2ML IJ SOLN
INTRAMUSCULAR | Status: DC | PRN
  Administered 2024-05-21: 4 mg via INTRAVENOUS

## 2024-05-21 MED ORDER — DEXMEDETOMIDINE HCL IN NACL 80 MCG/20ML IV SOLN
INTRAVENOUS | Status: AC
  Filled 2024-05-21: qty 20

## 2024-05-21 MED ORDER — CEFAZOLIN SODIUM-DEXTROSE 2-4 GM/100ML-% IV SOLN
INTRAVENOUS | Status: AC
  Filled 2024-05-21: qty 100

## 2024-05-21 MED ORDER — PHENYLEPHRINE 80 MCG/ML (10ML) SYRINGE FOR IV PUSH (FOR BLOOD PRESSURE SUPPORT)
PREFILLED_SYRINGE | INTRAVENOUS | Status: DC | PRN
  Administered 2024-05-21 (×2): 160 ug via INTRAVENOUS
  Administered 2024-05-21 (×2): 80 ug via INTRAVENOUS
  Administered 2024-05-21: 160 ug via INTRAVENOUS

## 2024-05-21 MED ORDER — ONDANSETRON HCL 4 MG/2ML IJ SOLN: 4.0000 mg | Freq: Once | INTRAMUSCULAR | Status: DC | PRN

## 2024-05-21 MED ORDER — LIDOCAINE HCL (PF) 2 % IJ SOLN
INTRAMUSCULAR | Status: AC
  Filled 2024-05-21: qty 5

## 2024-05-21 MED ORDER — SEVOFLURANE IN SOLN
RESPIRATORY_TRACT | Status: AC
  Filled 2024-05-21: qty 250

## 2024-05-21 MED ORDER — DEXMEDETOMIDINE HCL IN NACL 80 MCG/20ML IV SOLN
INTRAVENOUS | Status: DC | PRN
  Administered 2024-05-21: 12 ug via INTRAVENOUS

## 2024-05-21 MED ORDER — FENTANYL CITRATE (PF) 100 MCG/2ML IJ SOLN
INTRAMUSCULAR | Status: DC | PRN
  Administered 2024-05-21 (×2): 50 ug via INTRAVENOUS

## 2024-05-21 MED ORDER — PROPOFOL 10 MG/ML IV BOLUS
INTRAVENOUS | Status: AC
  Filled 2024-05-21: qty 20

## 2024-05-21 MED ORDER — EPHEDRINE SULFATE (PRESSORS) 25 MG/5ML IV SOSY
PREFILLED_SYRINGE | INTRAVENOUS | Status: DC | PRN
  Administered 2024-05-21 (×3): 5 mg via INTRAVENOUS

## 2024-05-21 MED ORDER — IOHEXOL 180 MG/ML  SOLN
INTRAMUSCULAR | Status: DC | PRN
  Administered 2024-05-21: 20 mL

## 2024-05-21 MED ORDER — ACETAMINOPHEN 10 MG/ML IV SOLN
INTRAVENOUS | Status: AC
  Filled 2024-05-21: qty 100

## 2024-05-21 MED ORDER — LIDOCAINE HCL (CARDIAC) PF 100 MG/5ML IV SOSY
PREFILLED_SYRINGE | INTRAVENOUS | Status: DC | PRN
  Administered 2024-05-21: 80 mg via INTRAVENOUS

## 2024-05-21 MED ORDER — CHLORHEXIDINE GLUCONATE 0.12 % MT SOLN: 15.0000 mL | Freq: Once | OROMUCOSAL | Status: DC

## 2024-05-21 MED ORDER — LACTATED RINGERS IV SOLN: INTRAVENOUS | Status: DC

## 2024-05-21 MED ORDER — CEFAZOLIN SODIUM-DEXTROSE 2-4 GM/100ML-% IV SOLN
2.0000 g | INTRAVENOUS | Status: AC
  Administered 2024-05-21: 2 g via INTRAVENOUS

## 2024-05-21 MED ORDER — FENTANYL CITRATE (PF) 100 MCG/2ML IJ SOLN: 25.0000 ug | INTRAMUSCULAR | Status: DC | PRN

## 2024-05-21 MED ORDER — OXYCODONE HCL 5 MG/5ML PO SOLN: 5.0000 mg | Freq: Once | ORAL | Status: DC | PRN

## 2024-05-21 MED ORDER — FENTANYL CITRATE (PF) 100 MCG/2ML IJ SOLN
INTRAMUSCULAR | Status: AC
  Filled 2024-05-21: qty 2

## 2024-05-21 MED ORDER — ORAL CARE MOUTH RINSE: 15.0000 mL | Freq: Once | OROMUCOSAL | Status: DC

## 2024-05-21 MED ORDER — OXYBUTYNIN CHLORIDE 5 MG PO TABS: ORAL_TABLET | ORAL | 0 refills | Status: DC

## 2024-05-21 MED ORDER — SODIUM CHLORIDE 0.9 % IR SOLN
Status: DC | PRN
  Administered 2024-05-21: 1

## 2024-05-21 MED ORDER — ACETAMINOPHEN 10 MG/ML IV SOLN
INTRAVENOUS | Status: DC | PRN
  Administered 2024-05-21: 1000 mg via INTRAVENOUS

## 2024-05-21 MED ORDER — CHLORHEXIDINE GLUCONATE 0.12 % MT SOLN
15.0000 mL | Freq: Once | OROMUCOSAL | Status: AC
  Administered 2024-05-21: 15 mL via OROMUCOSAL

## 2024-05-21 MED ORDER — PROPOFOL 10 MG/ML IV BOLUS
INTRAVENOUS | Status: DC | PRN
  Administered 2024-05-21: 150 mg via INTRAVENOUS
  Administered 2024-05-21: 50 mg via INTRAVENOUS

## 2024-05-21 MED ORDER — CHLORHEXIDINE GLUCONATE 0.12 % MT SOLN
OROMUCOSAL | Status: AC
  Filled 2024-05-21: qty 15

## 2024-05-21 MED ORDER — KETOROLAC TROMETHAMINE 30 MG/ML IJ SOLN
INTRAMUSCULAR | Status: DC | PRN
  Administered 2024-05-21: 15 mg via INTRAVENOUS

## 2024-05-21 MED ORDER — DEXAMETHASONE SOD PHOSPHATE PF 10 MG/ML IJ SOLN
INTRAMUSCULAR | Status: AC
  Filled 2024-05-21: qty 1

## 2024-05-21 MED ORDER — ORAL CARE MOUTH RINSE: 15.0000 mL | Freq: Once | OROMUCOSAL | Status: AC

## 2024-05-21 MED ORDER — OXYCODONE HCL 5 MG PO TABS: 5.0000 mg | ORAL_TABLET | Freq: Once | ORAL | Status: DC | PRN

## 2024-05-21 MED ORDER — ONDANSETRON HCL 4 MG/2ML IJ SOLN
INTRAMUSCULAR | Status: AC
  Filled 2024-05-21: qty 2

## 2024-05-21 NOTE — Transfer of Care (Signed)
 Immediate Anesthesia Transfer of Care Note  Patient: Andrea Spencer  Procedure(s) Performed: CYSTOSCOPY/URETEROSCOPY/HOLMIUM LASER/STENT PLACEMENT (Left)  Patient Location: PACU  Anesthesia Type:General  Level of Consciousness: sedated, unresponsive, and drowsy  Airway & Oxygen Therapy: Patient Spontanous Breathing and Patient connected to face mask oxygen  Post-op Assessment: Report given to RN and Post -op Vital signs reviewed and stable  Post vital signs: Reviewed and stable  Last Vitals:  Vitals Value Taken Time  BP 101/58 05/21/24 13:15  Temp 37 C 05/21/24 13:14  Pulse 87 05/21/24 13:15  Resp 21 05/21/24 13:15  SpO2 97 % 05/21/24 13:15    Last Pain:  Vitals:   05/21/24 1054  TempSrc:   PainSc: 0-No pain         Complications: No notable events documented.

## 2024-05-21 NOTE — Discharge Instructions (Signed)
 DISCHARGE INSTRUCTIONS FOR KIDNEY STONE/URETERAL STENT   MEDICATIONS:  1. Resume all your other meds from home.  2.  AZO (over-the-counter) can help with the burning/stinging when you urinate. 3.  Oxybutynin  is for bladder/stent irritation, Rx was sent to your pharmacy.  Tamsulosin  will also help with bladder/stent irritation  ACTIVITY:  1. May resume regular activities in 24 hours. 2. No driving while on narcotic pain medications  3. Drink plenty of water    SIGNS/SYMPTOMS TO CALL:  Common postoperative symptoms include urinary frequency, urgency, bladder spasm and blood in the urine  Please call us  if you have a fever greater than 101.5, uncontrolled nausea/vomiting, uncontrolled pain, dizziness, unable to urinate, excessively bloody urine, chest pain, shortness of breath, leg swelling, leg pain, or any other concerns or questions.   You can reach us  at [phone].   FOLLOW-UP:  1. You will be contacted for a follow-up appointment for stent removal in ~7 days

## 2024-05-21 NOTE — Anesthesia Postprocedure Evaluation (Signed)
"   Anesthesia Post Note  Patient: Andrea Spencer  Procedure(s) Performed: CYSTOSCOPY/URETEROSCOPY/HOLMIUM LASER/STENT PLACEMENT (Left)  Patient location during evaluation: PACU Anesthesia Type: General Level of consciousness: awake and alert Pain management: pain level controlled Vital Signs Assessment: post-procedure vital signs reviewed and stable Respiratory status: spontaneous breathing, nonlabored ventilation, respiratory function stable and patient connected to nasal cannula oxygen Cardiovascular status: blood pressure returned to baseline and stable Postop Assessment: no apparent nausea or vomiting Anesthetic complications: no   No notable events documented.   Last Vitals:  Vitals:   05/21/24 1345 05/21/24 1400  BP: 106/67 133/72  Pulse: 91 87  Resp: 17 16  Temp: (!) 36.2 C 36.9 C  SpO2: 95% 95%    Last Pain:  Vitals:   05/21/24 1400  TempSrc: Temporal  PainSc: 0-No pain                 Fairy A Amias Hutchinson      "

## 2024-05-21 NOTE — Anesthesia Procedure Notes (Signed)
 Procedure Name: LMA Insertion Date/Time: 05/21/2024 11:51 AM  Performed by: Dominica Krabbe, CRNAPre-anesthesia Checklist: Emergency Drugs available, Patient identified, Suction available, Patient being monitored and Timeout performed Patient Re-evaluated:Patient Re-evaluated prior to induction Oxygen Delivery Method: Circle system utilized Preoxygenation: Pre-oxygenation with 100% oxygen Induction Type: IV induction LMA: LMA inserted LMA Size: 3.0 Tube type: Oral Number of attempts: 1 Placement Confirmation: positive ETCO2 and breath sounds checked- equal and bilateral Tube secured with: Tape Dental Injury: Teeth and Oropharynx as per pre-operative assessment

## 2024-05-21 NOTE — Anesthesia Preprocedure Evaluation (Addendum)
 Anesthesia Evaluation  Patient identified by MRN, date of birth, ID band Patient awake    Reviewed: Allergy & Precautions, H&P , NPO status , Patient's Chart, lab work & pertinent test results  Airway Mallampati: II  TM Distance: >3 FB Neck ROM: Full    Dental no notable dental hx.    Pulmonary neg pulmonary ROS   Pulmonary exam normal breath sounds clear to auscultation       Cardiovascular negative cardio ROS Normal cardiovascular exam Rhythm:Regular Rate:Normal     Neuro/Psych negative neurological ROS  negative psych ROS   GI/Hepatic negative GI ROS, Neg liver ROS,,,  Endo/Other  negative endocrine ROS    Renal/GU negative Renal ROS  negative genitourinary   Musculoskeletal negative musculoskeletal ROS (+)    Abdominal   Peds negative pediatric ROS (+)  Hematology negative hematology ROS (+)   Anesthesia Other Findings   Reproductive/Obstetrics negative OB ROS                             Anesthesia Physical Anesthesia Plan  ASA: 2  Anesthesia Plan: General   Post-op Pain Management:    Induction: Intravenous  PONV Risk Score and Plan:   Airway Management Planned:   Additional Equipment:   Intra-op Plan:   Post-operative Plan: Extubation in OR  Informed Consent: I have reviewed the patients History and Physical, chart, labs and discussed the procedure including the risks, benefits and alternatives for the proposed anesthesia with the patient or authorized representative who has indicated his/her understanding and acceptance.     Dental advisory given  Plan Discussed with: CRNA  Anesthesia Plan Comments:        Anesthesia Quick Evaluation

## 2024-05-22 ENCOUNTER — Encounter: Payer: Self-pay | Admitting: Urology

## 2024-05-22 NOTE — Op Note (Signed)
" ° °  Preoperative diagnosis:  Left ureteral calculus  Postoperative diagnosis:  Left ureteral calculus  Procedure:  Cystoscopy Left ureteroscopy and stone removal Ureteroscopic laser lithotripsy Left ureteral stent placement (40F/24 cm) Left retrograde pyelography with interpretation  Surgeon: Glendia C. Ioanna Colquhoun, M.D.  Anesthesia: General  Complications: None  Intraoperative findings:  Cystoscopy: Bladder mucosa without solid or papillary lesions; UOs normal-appearing bilaterally. Ureteroscopy: Calculus in 2 pieces in the distal ureter Left retrograde pyelography post procedure showed no filling defects, stone fragments or contrast extravasation       EBL: Minimal  Specimens: Calculus fragments for analysis   Indication: Andrea Spencer is a 79 y.o.  female with a 6 mm left UPJ calculus.  Follow-up KUB in the office remarkable for migration to the left mid ureter. After reviewing the management options for treatment, the patient elected to proceed with the above surgical procedure(s). We have discussed the potential benefits and risks of the procedure, side effects of the proposed treatment, the likelihood of the patient achieving the goals of the procedure, and any potential problems that might occur during the procedure or recuperation. Informed consent has been obtained.  Description of procedure:  The patient was taken to the operating room and general anesthesia was induced.  The patient was placed in the dorsal lithotomy position, prepped and draped in the usual sterile fashion, and preoperative antibiotics were administered. A preoperative time-out was performed.   A 21 French cystoscope was lubricated and placed per urethra.  Panendoscopy was performed with findings as described above  Attention was directed to the left ureteral orifice and a 0.038 Sensor wire was unable to be advanced up the ureter due to resistance from the stone.  Attempts at placing a ZIPwire  were also unsuccessful.  The cystoscope was removed and a 4.5 F semirigid ureteroscope was placed per urethra.  The scope was positioned at the left UO and the ZIPwire was advanced through the ureteroscope into the ureter.  The scope was advanced over the wire and the calculus was identified in the distal ureter.  The wire was able to be advanced into the renal pelvis without difficulty.  The ureteroscope was removed and repassed.  A 200 m Moses holmium laser fiber was placed through the ureteroscope and the stone was fragmented at a setting of 0.3 J/40 hz.  There was significant retropulsion and a 1.9 F Escape basket was placed through the ureteroscope and advanced proximal to this zone and open to prevent retropulsion.  The laser fiber was then placed alongside the basket and the calculus was dusted.  Larger fragments were then removed from the ureter with a zero tip nitinol basket.  Reinspection of the ureter revealed no remaining visible stones or fragments.   Retrograde pyelogram was performed with findings as described above.  A 6 F/24 cm Contour ureteral stent without tether was placed under fluoroscopic guidance.  The wire was then removed with an adequate stent curl noted in the renal pelvis as well as in the bladder.  The bladder was then emptied and the procedure ended.  The patient appeared to tolerate the procedure well and without complications.  After anesthetic reversal the patient was transported to the PACU in stable condition.   Plan: Follow-up office visit 7-10 days for stent removal   Glendia Barba, MD  "

## 2024-05-23 NOTE — Interval H&P Note (Signed)
 History and Physical Interval Note:  05/23/2024 11:36 AM  Andrea Spencer  has presented today for surgery, with the diagnosis of Left Ureteral Stone.  The various methods of treatment have been discussed with the patient and family. After consideration of risks, benefits and other options for treatment, the patient has consented to  Procedures: CYSTOSCOPY/URETEROSCOPY/HOLMIUM LASER/STENT PLACEMENT (Left) as a surgical intervention.  The patient's history has been reviewed, patient examined, no change in status, stable for surgery.  I have reviewed the patient's chart and labs.  Questions were answered to the patient's satisfaction.    CV:RRR Lungs:clear  Glendia JAYSON Barba

## 2024-05-24 ENCOUNTER — Telehealth

## 2024-05-24 ENCOUNTER — Telehealth: Payer: Self-pay

## 2024-05-24 DIAGNOSIS — R197 Diarrhea, unspecified: Secondary | ICD-10-CM

## 2024-05-24 DIAGNOSIS — R5383 Other fatigue: Secondary | ICD-10-CM | POA: Diagnosis not present

## 2024-05-24 DIAGNOSIS — R7303 Prediabetes: Secondary | ICD-10-CM

## 2024-05-24 DIAGNOSIS — E7849 Other hyperlipidemia: Secondary | ICD-10-CM

## 2024-05-24 MED ORDER — AMOXICILLIN 500 MG PO CAPS: 500.0000 mg | ORAL_CAPSULE | Freq: Once | ORAL | Status: AC

## 2024-05-24 NOTE — Progress Notes (Signed)
 "  Ph: [phone] Fax: [phone]   Patient ID: Andrea Spencer, female    DOB: [birth year], 79 y.o.   MRN: [id number]  Virtual visit completed through MyChart, a video enabled telemedicine application. Reviewed limitations, risks, security and privacy concerns of performing a virtual visit and the availability of in person appointments. I also reviewed that there may be a patient responsible charge related to this service. The patient agreed to proceed.  The patient gave informed consent to the use of [employer] AI technology to record the contents of the encounter as documented below.  Patient location: home Provider location: [place] [facility], office Persons participating in this virtual visit: patient, provider Reuben MARLA Burkes, MD   If any vitals were documented, they were collected by patient at home unless specified below.    There were no vitals taken for this visit.   CC: TOC, Diarrhea, fatigue  Subjective:   HPI: Andrea Spencer is a 79 y.o. female presenting on 05/24/2024 for Transfer of Care (Has had terrible diarrhea. Comes without warning. It started about 3-4 days ago. Demaris. Does not want to do anything. )   Discussed the use of AI scribe software for clinical note transcription with the patient, who gave verbal consent to proceed.  History of Present Illness Says her left flank pain is better, she has not needed the oxycodone  over the past few days, nor has she needed the nausea medication. She says she has noticed diarrhea since 2 days ago, denies eating out or any recent diet changes, says there is blood present in it, denies abdominal discomfort, nausea or vomiting, denies fever or chills.  Denies any recent antibiotic use although she says she has amoxicillin  around the house for whenever she has dental procedures, but has not taken any recently.  Denies PPI use. Says she has been feeling fatigued over the past few days, does not feel like doing  anything.         Relevant past medical, surgical, family and social history reviewed and updated as indicated. Interim medical history since our last visit reviewed. Allergies and medications reviewed and updated. Outpatient Medications Prior to Visit  Medication Sig Dispense Refill   Acetaminophen  (TYLENOL  ARTHRITIS PAIN PO) Take by mouth.     Apoaequorin (PREVAGEN EXTRA STRENGTH PO) Take by mouth.     atorvastatin  (LIPITOR) 10 MG tablet TAKE 1 TABLET (10 MG TOTAL) BY MOUTH DAILY AT 6 PM. 90 tablet 0   Calcium  Carb-Cholecalciferol ([employer] CALCIUM  600 & VITAMIN D3 PO) Take by mouth 2 (two) times daily after a meal.     fluticasone  (FLONASE ) 50 MCG/ACT nasal spray Place 2 sprays into both nostrils daily. 16 g 11   ketoconazole  (NIZORAL ) 2 % shampoo APPLY THREE TIMES PER WEEK, MASSAGE INTO SCALP AND LEAVE IN FOR 10 MINUTES BEFORE RINSING OUT 120 mL 3   Krill Oil 1000 MG CAPS Take 3 capsules by mouth daily.     loratadine  (CLARITIN ) 10 MG tablet Take 1 tablet (10 mg total) by mouth at bedtime as needed for allergies. 90 tablet 3   Multiple Vitamin (MULTIVITAMIN WITH MINERALS) TABS tablet Take 1 tablet by mouth daily.     ondansetron  (ZOFRAN -ODT) 4 MG disintegrating tablet Take 1 tablet (4 mg total) by mouth every 8 (eight) hours as needed. 12 tablet 0   oxybutynin  (DITROPAN ) 5 MG tablet 1 tab tid prn frequency,urgency, bladder spasm 30 tablet 0   oxyCODONE  (ROXICODONE ) 5 MG immediate release tablet  Take 1 tablet (5 mg total) by mouth every 4 (four) hours as needed for severe pain (pain score 7-10). 12 tablet 0   promethazine  (PHENERGAN ) 12.5 MG tablet TAKE 1-2 TABLETS (12.5-25 MG TOTAL) BY MOUTH EVERY 8 (EIGHT) HOURS AS NEEDED. 30 tablet 0   sodium chloride  (OCEAN) 0.65 % SOLN nasal spray Place 2 sprays into both nostrils daily as needed for congestion. 30 mL 11   vitamin C (ASCORBIC ACID) 250 MG tablet Take 500 mg by mouth daily. Taking two tablets daily     Vitamins-Lipotropics (LIPOFLAVONOID  PO) Take by mouth in the morning and at bedtime.     amoxicillin  (AMOXIL ) 500 MG capsule Take 500 mg by mouth. 4 prior to dental appointment     triamcinolone  ointment (KENALOG ) 0.1 % Apply twice daily to affected areas up to 2 weeks as needed for itch. Avoid applying to face, groin, and axilla. Use as directed. Risk of skin atrophy with long-term use reviewed. 60 g 2   tamsulosin  (FLOMAX ) 0.4 MG CAPS capsule Take 1 capsule (0.4 mg total) by mouth daily. 30 capsule 0   TURMERIC PO Take by mouth daily.     cholecalciferol (VITAMIN D ) 1000 UNITS tablet Take 1,000 Units by mouth daily.     valACYclovir  (VALTREX ) 500 MG tablet Take one tablet twice daily for 10 days, begin 24-36 hours prior to appointment for chemical peel. (Patient not taking: Reported on 05/21/2024) 20 tablet 0   No facility-administered medications prior to visit.     Per HPI unless specifically indicated in ROS section below Review of Systems  All other systems reviewed and are negative.   Objective:   There were no vitals taken for this visit.  Wt Readings from Last 3 Encounters:  05/21/24 130 lb 15.3 oz (59.4 kg)  05/20/24 131 lb (59.4 kg)  05/19/24 132 lb (59.9 kg)       Physical exam: Gen: alert, NAD, not ill appearing Pulm: speaks in complete sentences without increased work of breathing Psych: normal mood, normal thought content        Results for orders placed or performed during the hospital encounter of 05/18/24  Urinalysis, Routine w reflex microscopic -Urine, Clean Catch   Collection Time: 05/18/24 11:14 AM  Result Value Ref Range   Color, Urine YELLOW (A) YELLOW   APPearance CLEAR (A) CLEAR   Specific Gravity, Urine 1.019 1.005 - 1.030   pH 5.0 5.0 - 8.0   Glucose, UA NEGATIVE NEGATIVE mg/dL   Hgb urine dipstick NEGATIVE NEGATIVE   Bilirubin Urine NEGATIVE NEGATIVE   Ketones, ur 5 (A) NEGATIVE mg/dL   Protein, ur NEGATIVE NEGATIVE mg/dL   Nitrite NEGATIVE NEGATIVE   Leukocytes,Ua NEGATIVE  NEGATIVE  Basic metabolic panel   Collection Time: 05/18/24 11:30 AM  Result Value Ref Range   Sodium 137 135 - 145 mmol/L   Potassium 4.3 3.5 - 5.1 mmol/L   Chloride 102 98 - 111 mmol/L   CO2 24 22 - 32 mmol/L   Glucose, Bld 117 (H) 70 - 99 mg/dL   BUN 12 8 - 23 mg/dL   Creatinine, Ser 8.86 (H) 0.44 - 1.00 mg/dL   Calcium  9.0 8.9 - 10.3 mg/dL   GFR, Estimated 50 (L) >60 mL/min   Anion gap 12 5 - 15  CBC with Differential   Collection Time: 05/18/24 11:30 AM  Result Value Ref Range   WBC 16.2 (H) 4.0 - 10.5 K/uL   RBC 4.08 3.87 - 5.11 MIL/uL  Hemoglobin 12.7 12.0 - 15.0 g/dL   HCT 61.4 63.9 - 53.9 %   MCV 94.4 80.0 - 100.0 fL   MCH 31.1 26.0 - 34.0 pg   MCHC 33.0 30.0 - 36.0 g/dL   RDW 87.3 88.4 - 84.4 %   Platelets 219 150 - 400 K/uL   nRBC 0.0 0.0 - 0.2 %   Neutrophils Relative % 83 %   Neutro Abs 13.4 (H) 1.7 - 7.7 K/uL   Lymphocytes Relative 8 %   Lymphs Abs 1.2 0.7 - 4.0 K/uL   Monocytes Relative 9 %   Monocytes Absolute 1.5 (H) 0.1 - 1.0 K/uL   Eosinophils Relative 0 %   Eosinophils Absolute 0.0 0.0 - 0.5 K/uL   Basophils Relative 0 %   Basophils Absolute 0.0 0.0 - 0.1 K/uL   Immature Granulocytes 0 %   Abs Immature Granulocytes 0.05 0.00 - 0.07 K/uL     Assessment & Plan:   Assessment & Plan Diarrhea, unspecified type Other fatigue 2 days of bloody diarrhea without systemic symptoms, will rule out bacterial causes such as Salmonella, will also rule out C. difficile given age and recent hospitalization for stent placement procedure by urology, CBC and CMP as well.  Reviewed medication list, does not appear to be iatrogenic.  Decision for antibiotics versus antidiarrheal medication dependent on lab results.  Plan for her to follow-up in person in the clinic in 7 days. Suspect fatigue is related to recent health issues including nephrolithiasis with recent stent placement, as well as several days of fluid loss from bacteria, will monitor this for now.  -  Counseled her to remain hydrated drinking 35 to 40 ounces of water/Gatorade/Pedialyte  Orders:   GI pathogen panel by PCR, stool; Future   CBC with Differential; Future   Comprehensive metabolic panel with GFR; Future   C difficile Toxins A+B W/Rflx; Future   Fecal leukocytes; Future  Prediabetes Based on A1c back in 2024 at 5.7, will repeat. Orders:   Hemoglobin A1c; Future  Other hyperlipidemia She is currently on Lipitor 10 mg, due for repeat lipid panel, will order and augment therapy based on that. - Continue Lipitor 10 mg daily  Orders:   Lipid panel; Future   I discussed the assessment and treatment plan with the patient. The patient was provided an opportunity to ask questions and all were answered. The patient agreed with the plan and demonstrated an understanding of the instructions. The patient was advised to call back or seek an in-person evaluation if the symptoms worsen or if the condition fails to improve as anticipated.   Return in about 1 week (around 05/31/2024) for Diarrhea then 4 weeks from now for CPE, no fasting labs.  Jersi Mcmaster K Jannine Abreu, MD  05/24/24     Contains text generated by [employer].    "

## 2024-05-24 NOTE — Patient Instructions (Addendum)
 Thank you for visiting [facility] today! Here's what we talked about: -  Call Urology office and request appointment for Feb 2nd, for stent removal: [facility] [address] [place], [place] [address] [phone] - START drinking either water or gatorade, or pedialyte up to 35-40oz daily - Come for fasting labs tomorrow

## 2024-05-24 NOTE — Assessment & Plan Note (Signed)
 She is currently on Lipitor 10 mg, due for repeat lipid panel, will order and augment therapy based on that. - Continue Lipitor 10 mg daily  Orders:   Lipid panel; Future

## 2024-05-24 NOTE — Telephone Encounter (Signed)
 Call patient and schedule the following: Fasting lab visit tomorrow Office visit 1 week from now for diarrhea CPE 4 weeks from now with no fasting labs

## 2024-05-24 NOTE — Assessment & Plan Note (Signed)
 2 days of bloody diarrhea without systemic symptoms, will rule out bacterial causes such as Salmonella, will also rule out C. difficile given age and recent hospitalization for stent placement procedure by urology, CBC and CMP as well.  Reviewed medication list, does not appear to be iatrogenic.  Decision for antibiotics versus antidiarrheal medication dependent on lab results.  Plan for her to follow-up in person in the clinic in 7 days. Suspect fatigue is related to recent health issues including nephrolithiasis with recent stent placement, as well as several days of fluid loss from bacteria, will monitor this for now.  - Counseled her to remain hydrated drinking 35 to 40 ounces of water/Gatorade/Pedialyte  Orders:   GI pathogen panel by PCR, stool; Future   CBC with Differential; Future   Comprehensive metabolic panel with GFR; Future   C difficile Toxins A+B W/Rflx; Future   Fecal leukocytes; Future

## 2024-05-25 ENCOUNTER — Other Ambulatory Visit

## 2024-05-25 DIAGNOSIS — N200 Calculus of kidney: Secondary | ICD-10-CM

## 2024-05-25 DIAGNOSIS — E7849 Other hyperlipidemia: Secondary | ICD-10-CM

## 2024-05-25 DIAGNOSIS — R197 Diarrhea, unspecified: Secondary | ICD-10-CM | POA: Diagnosis not present

## 2024-05-25 DIAGNOSIS — R7303 Prediabetes: Secondary | ICD-10-CM

## 2024-05-25 LAB — LIPID PANEL
Cholesterol: 207 mg/dL — ABNORMAL HIGH (ref 28–200)
HDL: 41.3 mg/dL
LDL Cholesterol: 145 mg/dL — ABNORMAL HIGH (ref 10–99)
NonHDL: 165.39
Total CHOL/HDL Ratio: 5
Triglycerides: 100 mg/dL (ref 10.0–149.0)
VLDL: 20 mg/dL (ref 0.0–40.0)

## 2024-05-25 LAB — CBC WITH DIFFERENTIAL/PLATELET
Basophils Absolute: 0.1 10*3/uL (ref 0.0–0.1)
Basophils Relative: 0.7 % (ref 0.0–3.0)
Eosinophils Absolute: 0.1 10*3/uL (ref 0.0–0.7)
Eosinophils Relative: 2.1 % (ref 0.0–5.0)
HCT: 38 % (ref 36.0–46.0)
Hemoglobin: 12.7 g/dL (ref 12.0–15.0)
Lymphocytes Relative: 17.3 % (ref 12.0–46.0)
Lymphs Abs: 1.3 10*3/uL (ref 0.7–4.0)
MCHC: 33.5 g/dL (ref 30.0–36.0)
MCV: 91.1 fl (ref 78.0–100.0)
Monocytes Absolute: 0.5 10*3/uL (ref 0.1–1.0)
Monocytes Relative: 7.2 % (ref 3.0–12.0)
Neutro Abs: 5.3 10*3/uL (ref 1.4–7.7)
Neutrophils Relative %: 72.7 % (ref 43.0–77.0)
Platelets: 435 10*3/uL — ABNORMAL HIGH (ref 150.0–400.0)
RBC: 4.18 Mil/uL (ref 3.87–5.11)
RDW: 12.5 % (ref 11.5–15.5)
WBC: 7.3 10*3/uL (ref 4.0–10.5)

## 2024-05-25 LAB — COMPREHENSIVE METABOLIC PANEL WITH GFR
ALT: 11 U/L (ref 3–35)
AST: 15 U/L (ref 5–37)
Albumin: 3.6 g/dL (ref 3.5–5.2)
Alkaline Phosphatase: 57 U/L (ref 39–117)
BUN: 6 mg/dL (ref 6–23)
CO2: 29 meq/L (ref 19–32)
Calcium: 9.2 mg/dL (ref 8.4–10.5)
Chloride: 101 meq/L (ref 96–112)
Creatinine, Ser: 0.58 mg/dL (ref 0.40–1.20)
GFR: 88.02 mL/min
Glucose, Bld: 102 mg/dL — ABNORMAL HIGH (ref 70–99)
Potassium: 3.8 meq/L (ref 3.5–5.1)
Sodium: 139 meq/L (ref 135–145)
Total Bilirubin: 0.9 mg/dL (ref 0.2–1.2)
Total Protein: 7.1 g/dL (ref 6.0–8.3)

## 2024-05-25 LAB — HEMOGLOBIN A1C: Hgb A1c MFr Bld: 5.6 % (ref 4.6–6.5)

## 2024-05-25 NOTE — Addendum Note (Signed)
 Addended by: HOPE VEVA PARAS on: 05/25/2024 02:06 PM   Modules accepted: Orders

## 2024-05-26 ENCOUNTER — Other Ambulatory Visit: Payer: Self-pay | Admitting: Radiology

## 2024-05-26 DIAGNOSIS — R197 Diarrhea, unspecified: Secondary | ICD-10-CM

## 2024-05-27 LAB — C. DIFFICILE GDH AND TOXIN A/B
GDH ANTIGEN: NOT DETECTED
MICRO NUMBER:: 17521542
SPECIMEN QUALITY:: ADEQUATE
TOXIN A AND B: NOT DETECTED

## 2024-05-27 NOTE — Telephone Encounter (Signed)
 Called and schedule pt for appts

## 2024-05-30 ENCOUNTER — Ambulatory Visit: Payer: Self-pay

## 2024-05-30 DIAGNOSIS — D75839 Thrombocytosis, unspecified: Secondary | ICD-10-CM

## 2024-05-30 DIAGNOSIS — A045 Campylobacter enteritis: Secondary | ICD-10-CM

## 2024-05-30 MED ORDER — AZITHROMYCIN 500 MG PO TABS: 500.0000 mg | ORAL_TABLET | Freq: Every day | ORAL | 0 refills | Status: AC

## 2024-06-01 ENCOUNTER — Other Ambulatory Visit

## 2024-06-01 ENCOUNTER — Encounter: Admitting: Urology

## 2024-06-01 LAB — FECAL LEUKOCYTES
Micro Number: 17523054
Result: NOT DETECTED
Specimen Quality: ADEQUATE

## 2024-06-01 LAB — GASTROINTESTINAL PATHOGEN PNL
CampyloBacter Group: DETECTED — AB
Norovirus GI/GII: NOT DETECTED
Rotavirus A: NOT DETECTED
Salmonella species: NOT DETECTED
Shiga Toxin 1: NOT DETECTED
Shiga Toxin 2: NOT DETECTED
Shigella Species: NOT DETECTED
Vibrio Group: NOT DETECTED
Yersinia enterocolitica: NOT DETECTED

## 2024-06-01 LAB — ISOLATE REFERRAL PH
MICRO NUMBER:: 17532266
SPECIMEN QUALITY:: ADEQUATE

## 2024-06-03 ENCOUNTER — Other Ambulatory Visit

## 2024-06-03 ENCOUNTER — Ambulatory Visit

## 2024-06-03 VITALS — BP 136/86 | HR 71 | Temp 98.9°F | Ht 61.0 in | Wt 126.0 lb

## 2024-06-03 DIAGNOSIS — A045 Campylobacter enteritis: Secondary | ICD-10-CM

## 2024-06-03 LAB — CBC WITH DIFFERENTIAL/PLATELET
Basophils Absolute: 0.1 10*3/uL (ref 0.0–0.1)
Basophils Relative: 1 % (ref 0.0–3.0)
Eosinophils Absolute: 0.4 10*3/uL (ref 0.0–0.7)
Eosinophils Relative: 4 % (ref 0.0–5.0)
HCT: 39.9 % (ref 36.0–46.0)
Hemoglobin: 13.5 g/dL (ref 12.0–15.0)
Lymphocytes Relative: 16.5 % (ref 12.0–46.0)
Lymphs Abs: 1.7 10*3/uL (ref 0.7–4.0)
MCHC: 33.9 g/dL (ref 30.0–36.0)
MCV: 91.6 fl (ref 78.0–100.0)
Monocytes Absolute: 0.8 10*3/uL (ref 0.1–1.0)
Monocytes Relative: 7.5 % (ref 3.0–12.0)
Neutro Abs: 7.1 10*3/uL (ref 1.4–7.7)
Neutrophils Relative %: 71 % (ref 43.0–77.0)
Platelets: 385 10*3/uL (ref 150.0–400.0)
RBC: 4.36 Mil/uL (ref 3.87–5.11)
RDW: 13.1 % (ref 11.5–15.5)
WBC: 10 10*3/uL (ref 4.0–10.5)

## 2024-06-03 MED ORDER — AZITHROMYCIN 500 MG PO TABS: 500.0000 mg | ORAL_TABLET | Freq: Every day | ORAL | 0 refills | Status: AC

## 2024-06-03 NOTE — Patient Instructions (Signed)
 Thank you for visiting [facility] today! Here's what we talked about: - START the azithromycin  - Drink at least 35-40oz - Drink gatorade 2 bottles a day for the next 3 days

## 2024-06-03 NOTE — Progress Notes (Signed)
 "  Subjective:   This visit was conducted in person. The patient gave informed consent to the use of [employer]bridge AI technology to record the contents of the encounter as documented below.   Patient ID: Andrea Spencer, female    DOB: 06/06/1945, 79 y.o.   MRN: [id number]   Discussed the use of AI scribe software for clinical note transcription with the patient, who gave verbal consent to proceed.  History of Present Illness Andrea Spencer is a 79 year old female who presents with bloody diarrhea due to Campylobacter infection.  She has been experiencing bloody diarrhea and general malaise for approximately two weeks, coinciding with her inability to leave the house due to snow. She has up to twelve bowel movements a day, with the most significant occurring in the morning. The frequency of bowel movements is described as 'nonstop,' occurring every half hour.  A stool sample confirmed the presence of Campylobacter bacteria. She has not started the prescribed antibiotic, azithromycin , due to difficulties accessing her MyChart account and the prescription expiring.  No nausea, vomiting, or fever. She reports significant discomfort due to frequent bathroom visits and acknowledges not drinking enough fluids, which is concerning given the fluid loss from diarrhea. She has not been using any antidiarrheal medications and has Zofran  available for nausea, though she has not needed it.      Review of Systems  All other systems reviewed and are negative.       Allergies[1]  Medications Ordered Prior to Encounter[2]  BP 136/86 (BP Location: Left Arm, Patient Position: Sitting, Cuff Size: Normal)   Pulse 71   Temp 98.9 F (37.2 C) (Oral)   Ht 5' 1 (1.549 m)   Wt 126 lb (57.2 kg)   SpO2 96%   BMI 23.81 kg/m   Objective:      Physical Exam GENERAL: Alert, cooperative, well developed, no acute distress. HEAD: Normocephalic atraumatic. EYES: Extraocular movements intact  bilaterally, pupils round, equal and reactive to light bilaterally, conjunctivae normal bilaterally. ABDOMEN: Soft, non-tender, non-distended, without organomegaly, hyperactive bowel sounds.  No rebound or guarding EXTREMITIES: No cyanosis, edema, capillary refill less than 2 seconds NEUROLOGICAL: Oriented to person, place and time, no gait abnormalities, moves all extremities without gross motor or sensory deficit.         Assessment & Plan:    Assessment & Plan Campylobacter enteritis Confirmed by stool sample. Symptoms include bloody diarrhea, and stomach discomfort.  Azithromycin  was sent several days ago, however, patient was reportedly not aware and has not yet picked it up.  Ordering CBC and CMP given several days of frequent BMs to rule out electrolyte abnormalities.  We sending antibiotic as below, counseled to hydrate with both water and Gatorade.  - Prescribed azithromycin  500 mg orally once daily for 3 days. - Advised taking azithromycin  with food. - Instructed to drink 35-40 ounces of water daily and use a tumbler or water bottle to track intake. - Recommended two bottles of Gatorade daily for three days to replace electrolytes. - Ordered lab tests for sodium and potassium levels. - Advised against antidiarrheal medication. - Instructed to use ondansetron  as needed for nausea. - Advised to return if symptoms persist post-antibiotic.   No follow-ups on file.  Physical already scheduled in the coming months.  Denna Fryberger K Trixie Maclaren, MD  06/03/24     Contains text generated by Abridge.        [1]  Allergies Allergen Reactions   Aspartame And Phenylalanine  Meclizine     Meloxicam Hives and Diarrhea   Other Itching    Sunscreens Lotions and Tanning products   Paxlovid  [Nirmatrelvir -Ritonavir ] Diarrhea  [2]  Current Outpatient Medications on File Prior to Visit  Medication Sig Dispense Refill   Acetaminophen  (TYLENOL  ARTHRITIS PAIN PO) Take by mouth.     atorvastatin   (LIPITOR) 10 MG tablet TAKE 1 TABLET (10 MG TOTAL) BY MOUTH DAILY AT 6 PM. 90 tablet 0   Calcium  Carb-Cholecalciferol ([employer] CALCIUM  600 & VITAMIN D3 PO) Take by mouth 2 (two) times daily after a meal.     fluticasone  (FLONASE ) 50 MCG/ACT nasal spray Place 2 sprays into both nostrils daily. 16 g 11   ketoconazole  (NIZORAL ) 2 % shampoo APPLY THREE TIMES PER WEEK, MASSAGE INTO SCALP AND LEAVE IN FOR 10 MINUTES BEFORE RINSING OUT 120 mL 3   Krill Oil 1000 MG CAPS Take 3 capsules by mouth daily.     loratadine  (CLARITIN ) 10 MG tablet Take 1 tablet (10 mg total) by mouth at bedtime as needed for allergies. 90 tablet 3   Multiple Vitamin (MULTIVITAMIN WITH MINERALS) TABS tablet Take 1 tablet by mouth daily.     sodium chloride  (OCEAN) 0.65 % SOLN nasal spray Place 2 sprays into both nostrils daily as needed for congestion. 30 mL 11   vitamin C (ASCORBIC ACID) 250 MG tablet Take 500 mg by mouth daily. Taking two tablets daily     Vitamins-Lipotropics (LIPOFLAVONOID PO) Take by mouth in the morning and at bedtime.     No current facility-administered medications on file prior to visit.   "

## 2024-06-04 ENCOUNTER — Encounter: Payer: Self-pay | Admitting: Urology

## 2024-06-09 ENCOUNTER — Encounter: Admitting: Urology

## 2024-06-09 ENCOUNTER — Other Ambulatory Visit

## 2024-07-08 ENCOUNTER — Encounter

## 2024-10-08 ENCOUNTER — Ambulatory Visit
